# Patient Record
Sex: Male | Born: 1945 | Race: White | Hispanic: No | Smoking: Former smoker
Health system: Southern US, Community
[De-identification: ages and names within clinical notes are randomized; demographics above are authoritative.]

## PROBLEM LIST (undated history)

## (undated) DIAGNOSIS — I219 Acute myocardial infarction, unspecified: Secondary | ICD-10-CM

## (undated) DIAGNOSIS — T7840XA Allergy, unspecified, initial encounter: Secondary | ICD-10-CM

## (undated) DIAGNOSIS — I2510 Atherosclerotic heart disease of native coronary artery without angina pectoris: Secondary | ICD-10-CM

## (undated) DIAGNOSIS — E785 Hyperlipidemia, unspecified: Secondary | ICD-10-CM

## (undated) DIAGNOSIS — K219 Gastro-esophageal reflux disease without esophagitis: Secondary | ICD-10-CM

## (undated) DIAGNOSIS — M1990 Unspecified osteoarthritis, unspecified site: Secondary | ICD-10-CM

## (undated) DIAGNOSIS — C801 Malignant (primary) neoplasm, unspecified: Secondary | ICD-10-CM

## (undated) HISTORY — DX: Unspecified osteoarthritis, unspecified site: M19.90

## (undated) HISTORY — PX: HEMORRHOID SURGERY: SHX153

## (undated) HISTORY — DX: Malignant (primary) neoplasm, unspecified: C80.1

## (undated) HISTORY — DX: Acute myocardial infarction, unspecified: I21.9

## (undated) HISTORY — DX: Gastro-esophageal reflux disease without esophagitis: K21.9

## (undated) HISTORY — PX: ORIF SHOULDER FRACTURE: SHX5035

## (undated) HISTORY — PX: TONSILLECTOMY AND ADENOIDECTOMY: SUR1326

## (undated) HISTORY — PX: CORONARY ANGIOPLASTY WITH STENT PLACEMENT: SHX49

## (undated) HISTORY — DX: Allergy, unspecified, initial encounter: T78.40XA

---

## 1987-01-30 DIAGNOSIS — I219 Acute myocardial infarction, unspecified: Secondary | ICD-10-CM

## 1987-01-30 HISTORY — DX: Acute myocardial infarction, unspecified: I21.9

## 2004-01-30 HISTORY — PX: COLONOSCOPY: SHX174

## 2004-11-16 ENCOUNTER — Ambulatory Visit: Payer: Self-pay | Admitting: Gastroenterology

## 2004-12-06 ENCOUNTER — Ambulatory Visit: Payer: Self-pay | Admitting: Gastroenterology

## 2005-02-13 ENCOUNTER — Ambulatory Visit (HOSPITAL_COMMUNITY): Admission: RE | Admit: 2005-02-13 | Discharge: 2005-02-13 | Payer: Self-pay | Admitting: Surgery

## 2005-03-22 ENCOUNTER — Ambulatory Visit (HOSPITAL_BASED_OUTPATIENT_CLINIC_OR_DEPARTMENT_OTHER): Admission: RE | Admit: 2005-03-22 | Discharge: 2005-03-22 | Payer: Self-pay | Admitting: Surgery

## 2009-11-15 ENCOUNTER — Encounter (INDEPENDENT_AMBULATORY_CARE_PROVIDER_SITE_OTHER): Payer: Self-pay | Admitting: *Deleted

## 2010-02-28 NOTE — Letter (Signed)
Summary: Colonoscopy Letter  [facility]  [address] [place], [place] [address]   Phone: [phone]  Fax: [phone]      November 15, 2009 MRN: [id number]   Jeff Decker POB [address], [place]  [address]   Dear [name],   According to your medical record, it is time for you to schedule a Colonoscopy. The American Cancer Society recommends this procedure as a method to detect early colon cancer. Patients with a family history of colon cancer, or a personal history of colon polyps or inflammatory bowel disease are at increased risk.  This letter has been generated based on the recommendations made at the time of your procedure. If you feel that in your particular situation this may no longer apply, please contact our office.  Please call our office at [phone] to schedule this appointment or to update your records at your earliest convenience.  Thank you for cooperating with [place] to provide you with the very best care possible.   Sincerely,   Vania Rea. Jarold Motto, M.D.  [facility] [phone]

## 2011-10-23 ENCOUNTER — Encounter: Payer: Self-pay | Admitting: Gastroenterology

## 2015-02-26 DIAGNOSIS — M1990 Unspecified osteoarthritis, unspecified site: Secondary | ICD-10-CM | POA: Insufficient documentation

## 2015-02-26 DIAGNOSIS — N529 Male erectile dysfunction, unspecified: Secondary | ICD-10-CM | POA: Insufficient documentation

## 2016-02-09 DIAGNOSIS — K219 Gastro-esophageal reflux disease without esophagitis: Secondary | ICD-10-CM | POA: Insufficient documentation

## 2016-03-26 DIAGNOSIS — E782 Mixed hyperlipidemia: Secondary | ICD-10-CM | POA: Insufficient documentation

## 2016-03-29 DIAGNOSIS — C801 Malignant (primary) neoplasm, unspecified: Secondary | ICD-10-CM

## 2016-03-29 HISTORY — DX: Malignant (primary) neoplasm, unspecified: C80.1

## 2016-03-29 HISTORY — PX: PROSTATE BIOPSY: SHX241

## 2016-05-15 ENCOUNTER — Other Ambulatory Visit: Payer: Self-pay | Admitting: Urology

## 2016-05-15 DIAGNOSIS — C61 Malignant neoplasm of prostate: Secondary | ICD-10-CM

## 2016-05-16 DIAGNOSIS — C61 Malignant neoplasm of prostate: Secondary | ICD-10-CM | POA: Insufficient documentation

## 2016-05-18 ENCOUNTER — Encounter: Payer: Self-pay | Admitting: Oncology

## 2016-05-18 ENCOUNTER — Telehealth: Payer: Self-pay | Admitting: Oncology

## 2016-05-18 NOTE — Telephone Encounter (Signed)
Tc to the pt to schedule an appt for him to see Dr. Alen Blew. Appt scheduled on 5/4 at 2pm. Demographics verified. Pt agreed to the appt date and time. Letter mailed to the pt.

## 2016-05-23 ENCOUNTER — Encounter (HOSPITAL_COMMUNITY)
Admission: RE | Admit: 2016-05-23 | Discharge: 2016-05-23 | Disposition: A | Discharge status: Home / Self Care | Payer: Medicare Other | Source: Ambulatory Visit | Attending: Urology | Admitting: Urology

## 2016-05-23 ENCOUNTER — Ambulatory Visit (HOSPITAL_COMMUNITY)
Admission: RE | Admit: 2016-05-23 | Discharge: 2016-05-23 | Disposition: A | Discharge status: Home / Self Care | Payer: Medicare Other | Source: Ambulatory Visit | Attending: Urology | Admitting: Urology

## 2016-05-23 DIAGNOSIS — C61 Malignant neoplasm of prostate: Secondary | ICD-10-CM | POA: Insufficient documentation

## 2016-05-23 MED ORDER — TECHNETIUM TC 99M MEDRONATE IV KIT
25.0000 | PACK | Freq: Once | INTRAVENOUS | Status: AC | PRN
  Administered 2016-05-23: 18.2 via INTRAVENOUS

## 2016-06-01 ENCOUNTER — Telehealth: Payer: Self-pay | Admitting: Oncology

## 2016-06-01 ENCOUNTER — Encounter: Payer: Self-pay | Admitting: Medical Oncology

## 2016-06-01 ENCOUNTER — Ambulatory Visit (HOSPITAL_BASED_OUTPATIENT_CLINIC_OR_DEPARTMENT_OTHER): Payer: Medicare Other | Admitting: Oncology

## 2016-06-01 VITALS — BP 120/74 | HR 69 | Temp 98.6°F | Resp 20 | Wt 176.2 lb

## 2016-06-01 DIAGNOSIS — C61 Malignant neoplasm of prostate: Secondary | ICD-10-CM

## 2016-06-01 DIAGNOSIS — E291 Testicular hypofunction: Secondary | ICD-10-CM | POA: Diagnosis not present

## 2016-06-01 DIAGNOSIS — C7951 Secondary malignant neoplasm of bone: Secondary | ICD-10-CM | POA: Diagnosis not present

## 2016-06-01 DIAGNOSIS — Z7189 Other specified counseling: Secondary | ICD-10-CM

## 2016-06-01 NOTE — Progress Notes (Signed)
Introduced myself to Jeff Decker and his friend Lattie Haw as the nurse navigator and my role. He saw Dr. Alen Blew today and just is in disbelief of his prostate cancer. He states that his father had prostate cancer but he just recently learned this. He also states that his mother had breast cancer. We discussed he may want to meet with genetics dues to mother, father and a brother with cancer. Mr. Fortner has 2 sons and a daughter. He is going to consider his options of chemotherapy or taking oral chemo. I gave him my business card and asked him to call with any questions or concerns.

## 2016-06-01 NOTE — Telephone Encounter (Signed)
No LOS at time of scheduling/ check out

## 2016-06-01 NOTE — Progress Notes (Signed)
Reason for Referral: Prostate cancer.   HPI: Jeff Decker is a 71 year old gentleman without any significant comorbid conditions referred to me for evaluation of prostate cancer. He has history of coronary artery disease status post MI in 1998 but have no issues since that time. On a routine physical examination he was found to have a PSA of 262 without any symptoms. He was evaluated by Dr. Pilar Jarvis at [facility] urology and a prostate biopsy obtained on 04/30/2016 showed a Gleason score 4+5 = 9 in one core and multiple cores of Gleason 8. His staging workup including a bone scan which showed metastatic disease in multiple areas including in the right pelvis, L1, ribs among others. CT scan of the abdomen and pelvis showed pelvic adenopathy including external iliac, and fell renal and periaortic lymph nodes. He was started on Firmagon I received the first injection in April 2018.  Despite these findings, he is completely asymptomatic. He denied any back pain, shoulder pain or hip pain. He denied any frequency or hematuria. He denies any hesitancy or incontinence. He remains in excellent health and shape and remains very active. He exercise regularly and continues to attend to her activities of daily living. His father was diagnosed with prostate cancer in his 13s but no other family history.  He does not report any headaches, blurry vision, syncope or seizures. He does not report any fevers, chills or sweats. He does not report any cough, wheezing or hemoptysis. He does not report any nausea, vomiting or abdominal pain. He does not report any frequency, urgency or hesitancy. He does not report any skeletal complaints of arthralgias or myalgias. Remaining review of system is unremarkable.  Past medical history significant for coronary disease, controlled surgery and status post tonsillectomy.     Current Outpatient Prescriptions:  .  aspirin EC 81 MG tablet, Take 81 mg by mouth daily., Disp: , Rfl:  .   Calcium Carb-Cholecalciferol (CALCIUM 600+D) 600-800 MG-UNIT TABS, Take by mouth., Disp: , Rfl:  .  glucosamine-chondroitin 500-400 MG tablet, Take 1 tablet by mouth 3 (three) times daily., Disp: , Rfl:  .  omeprazole (PRILOSEC) 20 MG capsule, Take 20 mg by mouth., Disp: , Rfl:  .  vardenafil (LEVITRA) 20 MG tablet, Take 1 po 30 minutes before planned activity, Disp: , Rfl:  .  DOCOSAHEXAENOIC ACID PO, Take 1 g by mouth., Disp: , Rfl:  .  Multiple Vitamin (MULTI-VITAMINS) TABS, Take by mouth., Disp: , Rfl: :  Not on File:  No family history on file.:  Social History   Social History  . Marital status: Widowed    Spouse name: N/A  . Number of children: N/A  . Years of education: N/A   Occupational History  . Not on file.   Social History Main Topics  . Smoking status: Not on file  . Smokeless tobacco: Not on file  . Alcohol use Not on file  . Drug use: Unknown  . Sexual activity: Not on file   Other Topics Concern  . Not on file   Social History Narrative  . No narrative on file  :  Pertinent items are noted in HPI.  Exam: Blood pressure 120/74, pulse 69, temperature 98.6 F (37 C), temperature source Oral, resp. rate 20, weight 176 lb 3.2 oz (79.9 kg), SpO2 98 %. ECOG 0. General appearance: alert and cooperative appeared without distress. Throat: No oral thrush or ulcers. Neck: no adenopathy Back: negative Resp: clear to auscultation bilaterally Chest wall: no tenderness Cardio:  regular rate and rhythm, S1, S2 normal, no murmur, click, rub or gallop GI: soft, non-tender; bowel sounds normal; no masses,  no organomegaly Pulses: 2+ and symmetric Skin: Skin color, texture, turgor normal. No rashes or lesions Lymph nodes: Cervical, supraclavicular, and axillary nodes normal.     Nm Bone Scan Whole Body  Result Date: 05/23/2016 CLINICAL DATA:  Recent diagnosis of prostate carcinoma EXAM: NUCLEAR MEDICINE WHOLE BODY BONE SCAN TECHNIQUE: Whole body anterior and  posterior images were obtained approximately 3 hours after intravenous injection of radiopharmaceutical. RADIOPHARMACEUTICALS:  18.2 mCi Technetium-82m MDP IV COMPARISON:  CT abdomen pelvis from today FINDINGS: There are multiple foci of increased activity, the majority of which are within bones of the pelvis, primarily the right acetabulum, the right sacrum, and the proximal femurs. In addition there is a focus of activity in L1 which corresponds to a sclerotic lesion by CT. There also more foci in several posterior ribs which are not included on the CT abdomen pelvis consistent with metastatic disease as well. IMPRESSION: Multiple foci of increased activity primarily in the right pelvis, L1, and several ribs consistent with bone metastases when correlated with today's CT of the abdomen and pelvis. Electronically Signed   By: Ivar Drape M.D.   On: 05/23/2016 15:54    Assessment and Plan:   71 year old gentleman with the following issues:  1. Metastatic prostate cancer diagnosed in April 2018. He presented with a PSA of 262 and a Gleason score 4+5 = 9 in one core with multiple cores involved Gleason score 4+4 = 8. Staging workup showed metastatic disease including lymphadenopathy as well as sclerotic bony lesions and multiple areas.  The natural course of advanced prostate cancer that is hormone sensitive was reviewed with the patient today. He has an incurable malignancy and no curative option exist at this time. However, multiple effective therapies exist to treat his disease and control his disease attentionally long-term. Androgen deprivation therapy is the standard of care at this time and should be effective in controlling his disease for a period of time. Eventually he will develop castration resistance and additional therapy might be required.  The rationale for using Zytiga or systemic chemotherapy in the hormone sensitive advanced prostate cancer was discussed today in detail. Risks and  benefits of both approaches were reviewed including the logistics. He appears to be more interested in systemic chemotherapy which I think is a better option for him. Complications associated with Taxotere chemotherapy were reviewed today in detail. These would include nausea, vomiting, myelosuppression, neuropathy, neutropenia, neutropenic sepsis among others. Alternatively Fabio Asa has less side effects but has to be taken continuously and associated with probably higher out of pocket cost.  He understands both therapies have extended overall survival compared to hormone therapy alone. He will let me know in the near future and we'll schedule his infusions accordingly once he decides.  2. IV access: Veins will be used for the time being.  3. Antiemetics: Prescription for Compazine will be available to the patient upon starting chemotherapy.  4. Androgen deprivation therapy: This will be required indefinitely.  5. Bone directed therapy: He will be a good candidate for Xgeva and will be discussed in the future.  6. Follow-up: Will be determined once he decides on the start of systemic therapy.

## 2016-06-04 ENCOUNTER — Telehealth: Payer: Self-pay | Admitting: Oncology

## 2016-06-04 ENCOUNTER — Telehealth: Payer: Self-pay | Admitting: *Deleted

## 2016-06-04 ENCOUNTER — Encounter: Payer: Self-pay | Admitting: *Deleted

## 2016-06-04 DIAGNOSIS — Z7189 Other specified counseling: Secondary | ICD-10-CM | POA: Insufficient documentation

## 2016-06-04 NOTE — Addendum Note (Signed)
Addended by: Wyatt Portela on: 06/04/2016 09:13 AM   Modules accepted: Orders

## 2016-06-04 NOTE — Progress Notes (Signed)
START ON PATHWAY REGIMEN - Prostate   Docetaxel 75 mg/m2:   A cycle is every 21 days:     Docetaxel   **Always confirm dose[facility] Bical[facility]:   A cycle is every 12 weeks:     Leuprolide acetate depot    Daily:     Bicalutamide   **Always confirm dose/schedule in your pharmacy ordering system**    Patient Characteristics: Adenocarcinoma, Metastatic, Hormone Naive, High Volume Disease* Current radiographic evidence of distant metastasis? Yes Histology: Adenocarcinoma AJCC T Category: cTX Gleason Primary: X AJCC N Category: NX Gleason Secondary: X AJCC M Category: M1c Gleason Score: 9 AJCC 8 Stage Grouping: IVB PSA Values (ng/mL): >= 20 Would you be surprised if this patient died  in the next year? I would be surprised if this patient died in the next year  Intent of Therapy: Non-Curative / Palliative Intent, Discussed with Patient

## 2016-06-04 NOTE — Telephone Encounter (Signed)
sw pt to confirm chemo class this week and 5/16 chemo start.

## 2016-06-04 NOTE — Telephone Encounter (Signed)
Patient calling to say he has decided to take chemotherapy ASAP. Note to dr Alen Blew. Schedulers will be calling to set up chemo teaching class and chemo appts.

## 2016-06-05 ENCOUNTER — Encounter: Payer: Self-pay | Admitting: *Deleted

## 2016-06-05 ENCOUNTER — Telehealth: Payer: Self-pay | Admitting: Oncology

## 2016-06-05 NOTE — Telephone Encounter (Signed)
Changed time of 5/30 lab/fu perm 5/8 schedule message. Spoke with patient he is aware.

## 2016-06-07 ENCOUNTER — Other Ambulatory Visit: Payer: Medicare Other

## 2016-06-07 ENCOUNTER — Encounter: Payer: Self-pay | Admitting: *Deleted

## 2016-06-07 ENCOUNTER — Telehealth: Payer: Self-pay | Admitting: *Deleted

## 2016-06-07 ENCOUNTER — Other Ambulatory Visit: Payer: Self-pay | Admitting: *Deleted

## 2016-06-07 MED ORDER — PROCHLORPERAZINE MALEATE 10 MG PO TABS: 10.0000 mg | ORAL_TABLET | Freq: Four times a day (QID) | ORAL | 1 refills | Status: DC | PRN

## 2016-06-07 NOTE — Telephone Encounter (Signed)
Patient left voice mail. I called back and got VM. Left message to cal me

## 2016-06-11 ENCOUNTER — Encounter: Payer: Self-pay | Admitting: Pharmacist

## 2016-06-11 ENCOUNTER — Telehealth: Payer: Self-pay | Admitting: *Deleted

## 2016-06-11 ENCOUNTER — Encounter: Payer: Self-pay | Admitting: *Deleted

## 2016-06-11 NOTE — Telephone Encounter (Signed)
Patient's height is 5'11"

## 2016-06-12 ENCOUNTER — Other Ambulatory Visit: Payer: Self-pay | Admitting: *Deleted

## 2016-06-12 DIAGNOSIS — C61 Malignant neoplasm of prostate: Secondary | ICD-10-CM

## 2016-06-13 ENCOUNTER — Ambulatory Visit (HOSPITAL_BASED_OUTPATIENT_CLINIC_OR_DEPARTMENT_OTHER): Payer: Medicare Other

## 2016-06-13 ENCOUNTER — Other Ambulatory Visit: Payer: Self-pay | Admitting: Oncology

## 2016-06-13 ENCOUNTER — Other Ambulatory Visit (HOSPITAL_BASED_OUTPATIENT_CLINIC_OR_DEPARTMENT_OTHER): Payer: Medicare Other

## 2016-06-13 VITALS — BP 137/69 | HR 55 | Temp 97.9°F | Resp 16

## 2016-06-13 DIAGNOSIS — C61 Malignant neoplasm of prostate: Secondary | ICD-10-CM

## 2016-06-13 DIAGNOSIS — Z5111 Encounter for antineoplastic chemotherapy: Secondary | ICD-10-CM

## 2016-06-13 LAB — COMPREHENSIVE METABOLIC PANEL
ALT: 55 U/L (ref 0–55)
AST: 38 U/L — ABNORMAL HIGH (ref 5–34)
Albumin: 3.9 g/dL (ref 3.5–5.0)
Alkaline Phosphatase: 101 U/L (ref 40–150)
Anion Gap: 10 mEq/L (ref 3–11)
BUN: 19.1 mg/dL (ref 7.0–26.0)
CO2: 23 mEq/L (ref 22–29)
Calcium: 9.4 mg/dL (ref 8.4–10.4)
Chloride: 107 mEq/L (ref 98–109)
Creatinine: 0.8 mg/dL (ref 0.7–1.3)
EGFR: 88 mL/min/{1.73_m2} — ABNORMAL LOW (ref >90)
Glucose: 115 mg/dl (ref 70–140)
Potassium: 4.3 mEq/L (ref 3.5–5.1)
Sodium: 140 mEq/L (ref 136–145)
Total Bilirubin: 0.46 mg/dL (ref 0.20–1.20)
Total Protein: 7.3 g/dL (ref 6.4–8.3)

## 2016-06-13 LAB — CBC WITH DIFFERENTIAL/PLATELET
BASO%: 0.6 % (ref 0.0–2.0)
Basophils Absolute: 0 10*3/uL (ref 0.0–0.1)
EOS%: 2.8 % (ref 0.0–7.0)
Eosinophils Absolute: 0.2 10*3/uL (ref 0.0–0.5)
HCT: 38.7 % (ref 38.4–49.9)
HGB: 13.3 g/dL (ref 13.0–17.1)
LYMPH%: 31.4 % (ref 14.0–49.0)
MCH: 29.4 pg (ref 27.2–33.4)
MCHC: 34.5 g/dL (ref 32.0–36.0)
MCV: 85.2 fL (ref 79.3–98.0)
MONO#: 0.7 10*3/uL (ref 0.1–0.9)
MONO%: 10.5 % (ref 0.0–14.0)
NEUT#: 3.7 10*3/uL (ref 1.5–6.5)
NEUT%: 54.7 % (ref 39.0–75.0)
Platelets: 245 10*3/uL (ref 140–400)
RBC: 4.54 10*6/uL (ref 4.20–5.82)
RDW: 13.8 % (ref 11.0–14.6)
WBC: 6.8 10*3/uL (ref 4.0–10.3)
lymph#: 2.1 10*3/uL (ref 0.9–3.3)

## 2016-06-13 MED ORDER — DEXAMETHASONE SODIUM PHOSPHATE 10 MG/ML IJ SOLN
INTRAMUSCULAR | Status: AC
  Filled 2016-06-13: qty 1

## 2016-06-13 MED ORDER — DEXAMETHASONE SODIUM PHOSPHATE 10 MG/ML IJ SOLN
10.0000 mg | Freq: Once | INTRAMUSCULAR | Status: AC
  Administered 2016-06-13: 10 mg via INTRAVENOUS

## 2016-06-13 MED ORDER — DOCETAXEL CHEMO INJECTION 160 MG/16ML
75.0000 mg/m2 | Freq: Once | INTRAVENOUS | Status: AC
  Administered 2016-06-13: 150 mg via INTRAVENOUS
  Filled 2016-06-13: qty 15

## 2016-06-13 MED ORDER — SODIUM CHLORIDE 0.9 % IV SOLN
Freq: Once | INTRAVENOUS | Status: AC
  Administered 2016-06-13: 09:00:00 via INTRAVENOUS

## 2016-06-13 NOTE — Patient Instructions (Signed)
[facility] Discharge Instructions for Patients Receiving Chemotherapy  Today you received the following chemotherapy agents:  Taxotere.  To help prevent nausea and vomiting after your treatment, we encourage you to take your nausea medication as directed.   If you develop nausea and vomiting that is not controlled by your nausea medication, call the clinic.   BELOW ARE SYMPTOMS THAT SHOULD BE REPORTED IMMEDIATELY:  *FEVER GREATER THAN 100.5 F  *CHILLS WITH OR WITHOUT FEVER  NAUSEA AND VOMITING THAT IS NOT CONTROLLED WITH YOUR NAUSEA MEDICATION  *UNUSUAL SHORTNESS OF BREATH  *UNUSUAL BRUISING OR BLEEDING  TENDERNESS IN MOUTH AND THROAT WITH OR WITHOUT PRESENCE OF ULCERS  *URINARY PROBLEMS  *BOWEL PROBLEMS  UNUSUAL RASH Items with * indicate a potential emergency and should be followed up as soon as possible.  Feel free to call the clinic you have any questions or concerns. The clinic phone number is [phone].  Please show the CHEMO ALERT CARD at check-in to the Emergency Department and triage nurse.  Docetaxel injection What is this medicine? DOCETAXEL (doe se TAX el) is a chemotherapy drug. It targets fast dividing cells, like cancer cells, and causes these cells to die. This medicine is used to treat many types of cancers like breast cancer, certain stomach cancers, head and neck cancer, lung cancer, and prostate cancer. This medicine may be used for other purposes; ask your health care provider or pharmacist if you have questions. COMMON BRAND NAME(S): Docefrez, Taxotere What should I tell my health care provider before I take this medicine? They need to know if you have any of these conditions: -infection (especially a virus infection such as chickenpox, cold sores, or herpes) -liver disease -low blood counts, like low white cell, platelet, or red cell counts -an unusual or allergic reaction to docetaxel, polysorbate 80, other chemotherapy agents,  other medicines, foods, dyes, or preservatives -pregnant or trying to get pregnant -breast-feeding How should I use this medicine? This drug is given as an infusion into a vein. It is administered in a hospital or clinic by a specially trained health care professional. Talk to your pediatrician regarding the use of this medicine in children. Special care may be needed. Overdosage: If you think you have taken too much of this medicine contact a poison control center or emergency room at once. NOTE: This medicine is only for you. Do not share this medicine with others. What if I miss a dose? It is important not to miss your dose. Call your doctor or health care professional if you are unable to keep an appointment. What may interact with this medicine? -cyclosporine -erythromycin -ketoconazole -medicines to increase blood counts like filgrastim, pegfilgrastim, sargramostim -vaccines Talk to your doctor or health care professional before taking any of these medicines: -acetaminophen -aspirin -ibuprofen -ketoprofen -naproxen This list may not describe all possible interactions. Give your health care provider a list of all the medicines, herbs, non-prescription drugs, or dietary supplements you use. Also tell them if you smoke, drink alcohol, or use illegal drugs. Some items may interact with your medicine. What should I watch for while using this medicine? Your condition will be monitored carefully while you are receiving this medicine. You will need important blood work done while you are taking this medicine. This drug may make you feel generally unwell. This is not uncommon, as chemotherapy can affect healthy cells as well as cancer cells. Report any side effects. Continue your course of treatment even though you feel ill unless   your doctor tells you to stop. In some cases, you may be given additional medicines to help with side effects. Follow all directions for their use. Call your doctor  or health care professional for advice if you get a fever, chills or sore throat, or other symptoms of a cold or flu. Do not treat yourself. This drug decreases your body's ability to fight infections. Try to avoid being around people who are sick. This medicine may increase your risk to bruise or bleed. Call your doctor or health care professional if you notice any unusual bleeding. This medicine may contain alcohol in the product. You may get drowsy or dizzy. Do not drive, use machinery, or do anything that needs mental alertness until you know how this medicine affects you. Do not stand or sit up quickly, especially if you are an older patient. This reduces the risk of dizzy or fainting spells. Avoid alcoholic drinks. Do not become pregnant while taking this medicine. Women should inform their doctor if they wish to become pregnant or think they might be pregnant. There is a potential for serious side effects to an unborn child. Talk to your health care professional or pharmacist for more information. Do not breast-feed an infant while taking this medicine. What side effects may I notice from receiving this medicine? Side effects that you should report to your doctor or health care professional as soon as possible: -allergic reactions like skin rash, itching or hives, swelling of the face, lips, or tongue -low blood counts - This drug may decrease the number of white blood cells, red blood cells and platelets. You may be at increased risk for infections and bleeding. -signs of infection - fever or chills, cough, sore throat, pain or difficulty passing urine -signs of decreased platelets or bleeding - bruising, pinpoint red spots on the skin, black, tarry stools, nosebleeds -signs of decreased red blood cells - unusually weak or tired, fainting spells, lightheadedness -breathing problems -fast or irregular heartbeat -low blood pressure -mouth sores -nausea and vomiting -pain, swelling, redness or  irritation at the injection site -pain, tingling, numbness in the hands or feet -swelling of the ankle, feet, hands -weight gain Side effects that usually do not require medical attention (report to your doctor or health care professional if they continue or are bothersome): -bone pain -complete hair loss including hair on your head, underarms, pubic hair, eyebrows, and eyelashes -diarrhea -excessive tearing -changes in the color of fingernails -loosening of the fingernails -nausea -muscle pain -red flush to skin -sweating -weak or tired This list may not describe all possible side effects. Call your doctor for medical advice about side effects. You may report side effects to FDA at 1-800-FDA-1088. Where should I keep my medicine? This drug is given in a hospital or clinic and will not be stored at home. NOTE: This sheet is a summary. It may not cover all possible information. If you have questions about this medicine, talk to your doctor, pharmacist, or health care provider.  2018 Elsevier/Gold Standard (2015-02-17 12:32:56)    

## 2016-06-14 ENCOUNTER — Ambulatory Visit (HOSPITAL_BASED_OUTPATIENT_CLINIC_OR_DEPARTMENT_OTHER): Payer: Medicare Other

## 2016-06-14 VITALS — BP 128/66 | HR 63 | Temp 96.9°F | Resp 18

## 2016-06-14 DIAGNOSIS — C61 Malignant neoplasm of prostate: Secondary | ICD-10-CM

## 2016-06-14 DIAGNOSIS — Z5189 Encounter for other specified aftercare: Secondary | ICD-10-CM | POA: Diagnosis not present

## 2016-06-14 MED ORDER — PEGFILGRASTIM INJECTION 6 MG/0.6ML ~~LOC~~
6.0000 mg | PREFILLED_SYRINGE | Freq: Once | SUBCUTANEOUS | Status: AC
  Administered 2016-06-14: 6 mg via SUBCUTANEOUS
  Filled 2016-06-14: qty 0.6

## 2016-06-14 NOTE — Patient Instructions (Signed)
Pegfilgrastim injection What is this medicine? PEGFILGRASTIM (PEG fil gra stim) is a long-acting granulocyte colony-stimulating factor that stimulates the growth of neutrophils, a type of white blood cell important in the body's fight against infection. It is used to reduce the incidence of fever and infection in patients with certain types of cancer who are receiving chemotherapy that affects the bone marrow, and to increase survival after being exposed to high doses of radiation. This medicine may be used for other purposes; ask your health care provider or pharmacist if you have questions. COMMON BRAND NAME(S): Neulasta What should I tell my health care provider before I take this medicine? They need to know if you have any of these conditions: -kidney disease -latex allergy -ongoing radiation therapy -sickle cell disease -skin reactions to acrylic adhesives (On-Body Injector only) -an unusual or allergic reaction to pegfilgrastim, filgrastim, other medicines, foods, dyes, or preservatives -pregnant or trying to get pregnant -breast-feeding How should I use this medicine? This medicine is for injection under the skin. If you get this medicine at home, you will be taught how to prepare and give the pre-filled syringe or how to use the On-body Injector. Refer to the patient Instructions for Use for detailed instructions. Use exactly as directed. Tell your healthcare provider immediately if you suspect that the On-body Injector may not have performed as intended or if you suspect the use of the On-body Injector resulted in a missed or partial dose. It is important that you put your used needles and syringes in a special sharps container. Do not put them in a trash can. If you do not have a sharps container, call your pharmacist or healthcare provider to get one. Talk to your pediatrician regarding the use of this medicine in children. While this drug may be prescribed for selected conditions,  precautions do apply. Overdosage: If you think you have taken too much of this medicine contact a poison control center or emergency room at once. NOTE: This medicine is only for you. Do not share this medicine with others. What if I miss a dose? It is important not to miss your dose. Call your doctor or health care professional if you miss your dose. If you miss a dose due to an On-body Injector failure or leakage, a new dose should be administered as soon as possible using a single prefilled syringe for manual use. What may interact with this medicine? Interactions have not been studied. Give your health care provider a list of all the medicines, herbs, non-prescription drugs, or dietary supplements you use. Also tell them if you smoke, drink alcohol, or use illegal drugs. Some items may interact with your medicine. This list may not describe all possible interactions. Give your health care provider a list of all the medicines, herbs, non-prescription drugs, or dietary supplements you use. Also tell them if you smoke, drink alcohol, or use illegal drugs. Some items may interact with your medicine. What should I watch for while using this medicine? You may need blood work done while you are taking this medicine. If you are going to need a MRI, CT scan, or other procedure, tell your doctor that you are using this medicine (On-Body Injector only). What side effects may I notice from receiving this medicine? Side effects that you should report to your doctor or health care professional as soon as possible: -allergic reactions like skin rash, itching or hives, swelling of the face, lips, or tongue -dizziness -fever -pain, redness, or irritation at site   where injected -pinpoint red spots on the skin -red or dark-brown urine -shortness of breath or breathing problems -stomach or side pain, or pain at the shoulder -swelling -tiredness -trouble passing urine or change in the amount of urine Side  effects that usually do not require medical attention (report to your doctor or health care professional if they continue or are bothersome): -bone pain -muscle pain This list may not describe all possible side effects. Call your doctor for medical advice about side effects. You may report side effects to FDA at 1-800-FDA-1088. Where should I keep my medicine? Keep out of the reach of children. Store pre-filled syringes in a refrigerator between 2 and 8 degrees C (36 and 46 degrees F). Do not freeze. Keep in carton to protect from light. Throw away this medicine if it is left out of the refrigerator for more than 48 hours. Throw away any unused medicine after the expiration date. NOTE: This sheet is a summary. It may not cover all possible information. If you have questions about this medicine, talk to your doctor, pharmacist, or health care provider.  2018 Elsevier/Gold Standard (2016-01-12 12:58:03)  

## 2016-06-18 ENCOUNTER — Telehealth: Payer: Self-pay | Admitting: *Deleted

## 2016-06-18 ENCOUNTER — Telehealth: Payer: Self-pay

## 2016-06-18 NOTE — Telephone Encounter (Signed)
-----   Message from Flo Shanks, RN sent at 06/13/2016 12:12 PM EDT ----- Regarding: Dr. Alen Blew First time Taxotere First time Taxotere with no problems.

## 2016-06-18 NOTE — Telephone Encounter (Signed)
Spoke with patient. States he is doing well after his first chemotherapy treatment. Eating and drinking well. Denies n/v or diarrhea. Knows to call for any problems

## 2016-06-18 NOTE — Telephone Encounter (Signed)
Pt has hx of low back pain that comes and goes. He was asking if it is OK to use ibuprofen. Gave him permission to use minimally. Explained docetaxel and neulasta can create aches. Suggested warm pack. Instructed to call if ibuprofen does not help.

## 2016-06-19 ENCOUNTER — Encounter: Payer: Self-pay | Admitting: *Deleted

## 2016-06-27 ENCOUNTER — Other Ambulatory Visit (HOSPITAL_BASED_OUTPATIENT_CLINIC_OR_DEPARTMENT_OTHER): Payer: Medicare Other

## 2016-06-27 ENCOUNTER — Ambulatory Visit (HOSPITAL_BASED_OUTPATIENT_CLINIC_OR_DEPARTMENT_OTHER): Payer: Medicare Other | Admitting: Oncology

## 2016-06-27 ENCOUNTER — Telehealth: Payer: Self-pay | Admitting: Oncology

## 2016-06-27 VITALS — BP 119/60 | HR 65 | Temp 98.0°F | Resp 20 | Ht 71.0 in | Wt 178.3 lb

## 2016-06-27 DIAGNOSIS — C61 Malignant neoplasm of prostate: Secondary | ICD-10-CM

## 2016-06-27 DIAGNOSIS — C7951 Secondary malignant neoplasm of bone: Secondary | ICD-10-CM | POA: Diagnosis not present

## 2016-06-27 LAB — CBC WITH DIFFERENTIAL/PLATELET
BASO%: 1 % (ref 0.0–2.0)
Basophils Absolute: 0.1 10*3/uL (ref 0.0–0.1)
EOS%: 0.1 % (ref 0.0–7.0)
Eosinophils Absolute: 0 10*3/uL (ref 0.0–0.5)
HCT: 37 % — ABNORMAL LOW (ref 38.4–49.9)
HGB: 12.5 g/dL — ABNORMAL LOW (ref 13.0–17.1)
LYMPH%: 21.5 % (ref 14.0–49.0)
MCH: 29 pg (ref 27.2–33.4)
MCHC: 33.9 g/dL (ref 32.0–36.0)
MCV: 85.5 fL (ref 79.3–98.0)
MONO#: 0.7 10*3/uL (ref 0.1–0.9)
MONO%: 7.1 % (ref 0.0–14.0)
NEUT#: 6.9 10*3/uL — ABNORMAL HIGH (ref 1.5–6.5)
NEUT%: 70.3 % (ref 39.0–75.0)
Platelets: 219 10*3/uL (ref 140–400)
RBC: 4.33 10*6/uL (ref 4.20–5.82)
RDW: 13.9 % (ref 11.0–14.6)
WBC: 9.8 10*3/uL (ref 4.0–10.3)
lymph#: 2.1 10*3/uL (ref 0.9–3.3)

## 2016-06-27 LAB — COMPREHENSIVE METABOLIC PANEL
ALT: 39 U/L (ref 0–55)
AST: 24 U/L (ref 5–34)
Albumin: 3.8 g/dL (ref 3.5–5.0)
Alkaline Phosphatase: 94 U/L (ref 40–150)
Anion Gap: 7 mEq/L (ref 3–11)
BUN: 17.2 mg/dL (ref 7.0–26.0)
CO2: 26 mEq/L (ref 22–29)
Calcium: 9 mg/dL (ref 8.4–10.4)
Chloride: 107 mEq/L (ref 98–109)
Creatinine: 0.8 mg/dL (ref 0.7–1.3)
EGFR: 88 mL/min/{1.73_m2} — ABNORMAL LOW (ref >90)
Glucose: 96 mg/dl (ref 70–140)
Potassium: 4 mEq/L (ref 3.5–5.1)
Sodium: 140 mEq/L (ref 136–145)
Total Bilirubin: 0.3 mg/dL (ref 0.20–1.20)
Total Protein: 7.1 g/dL (ref 6.4–8.3)

## 2016-06-27 NOTE — Progress Notes (Signed)
Hematology and Oncology Follow Up Visit  Jeff Decker [id number] 07/21/1945 71 y.o. 06/27/2016 12:13 PM [facility] [place]   Principle Diagnosis: 71 year old gentleman with prostate cancer diagnosed in April 2018. He presented with a PSA of 262 and a Gleason score 4+5 = 9. He has metastatic disease with lymphadenopathy and bone disease.   Prior Therapy:  He is status post prostate biopsy done on April 2018 showed a Gleason score 4+5 = 9.  Current therapy:  Androgen deprivation therapy in the form of Firmagon started in April 2018.  Taxotere chemotherapy at 75 mg/m cycle one given on 06/13/2016.  Interim History: [name] presents today for a follow-up visit. Since the last visit, he received the first cycle of chemotherapy without complications. He denied any nausea, vomiting, neuropathy or any infusion related complications. He denied any edema or excessive fatigue. He remains active and continues to attend to her activities of daily living. He denies any back pain, shoulder pain or any pathological fractures.  He does not report any headaches, blurry vision, syncope or seizures. He does not report any fevers, chills or sweats. He does not report any cough, wheezing or hemoptysis. He does not report any nausea, vomiting or abdominal pain. He does not report any frequency, urgency or hesitancy. He does not report any skeletal complaints of arthralgias or myalgias. Remaining review of system is unremarkable  Medications: I have reviewed the patient's current medications.  Current Outpatient Prescriptions  Medication Sig Dispense Refill  . aspirin EC 81 MG tablet Take 81 mg by mouth daily.    . Calcium Carb-Cholecalciferol (CALCIUM 600+D) 600-800 MG-UNIT TABS Take by mouth.    Marland Kitchen glucosamine-chondroitin 500-400 MG tablet Take 1 tablet by mouth 3 (three) times daily.    . Multiple Vitamin (MULTI-VITAMINS) TABS Take by mouth.    Marland Kitchen  omeprazole (PRILOSEC) 20 MG capsule Take 20 mg by mouth.    . prochlorperazine (COMPAZINE) 10 MG tablet Take 1 tablet (10 mg total) by mouth every 6 (six) hours as needed for nausea or vomiting. 30 tablet 1  . vardenafil (LEVITRA) 20 MG tablet Take 1 po 30 minutes before planned activity     No current facility-administered medications for this visit.      Allergies: No Known Allergies  Past Medical History, Surgical history, Social history, and Family History were reviewed and updated.   Physical Exam: Blood pressure 119/60, pulse 65, temperature 98 F (36.7 C), temperature source Oral, resp. rate 20, height 5\' 11"  (1.803 m), weight 178 lb 4.8 oz (80.9 kg), SpO2 99 %. ECOG: 0 General appearance: alert and cooperative Head: Normocephalic, without obvious abnormality Neck: no adenopathy Lymph nodes: Cervical, supraclavicular, and axillary nodes normal. Heart:regular rate and rhythm, S1, S2 normal, no murmur, click, rub or gallop Lung:chest clear, no wheezing, rales, normal symmetric air entry. Abdomin: soft, non-tender, without masses or organomegaly EXT:no erythema, induration, or nodules   Lab Results: Lab Results  Component Value Date   WBC 9.8 06/27/2016   HGB 12.5 (L) 06/27/2016   HCT 37.0 (L) 06/27/2016   MCV 85.5 06/27/2016   PLT 219 06/27/2016     Chemistry      Component Value Date/Time   NA 140 06/13/2016 0816   K 4.3 06/13/2016 0816   CO2 23 06/13/2016 0816   BUN 19.1 06/13/2016 0816   CREATININE 0.8 06/13/2016 0816      Component Value Date/Time   CALCIUM 9.4 06/13/2016 0816   ALKPHOS 101 06/13/2016 0816  AST 38 (H) 06/13/2016 0816   ALT 55 06/13/2016 0816   BILITOT 0.46 06/13/2016 0816      Impression and Plan:  71 year old gentleman with the following issues:  1. Metastatic prostate cancer diagnosed in April 2018. He presented with a PSA of 262 and a Gleason score 4+5 = 9 in one core with multiple cores involved Gleason score 4+4 = 8. Staging  workup showed metastatic disease including lymphadenopathy as well as sclerotic bony lesions and multiple areas.  He is currently receiving Taxotere chemotherapy at 75 mg/m and tolerated it very well.  The plan is to proceed with cycle 2 of chemotherapy without any dose reduction or delay. He is scheduled to receive that infusion on 07/04/2016. The plan is to complete total of 6 cycles.  2. IV access: His peripheral veins will be used for the time being. We will discuss the utility of Port-A-Cath of needed to in the future.  3. Antiemetics: Prescription for Compazine will be available to the patient upon starting chemotherapy.  4. Androgen deprivation therapy: This will be required indefinitely. He is currently receiving Mills Koller on a monthly basis at [facility] urology.  5. Bone directed therapy: He will be a good candidate for Xgeva and will be discussed in the future.  6. Follow-up: Next week to receive cycle 2 of chemotherapy and in 3 weeks after that for the next cycle of chemotherapy.   Zola Button, MD 5/30/201812:13 PM

## 2016-06-27 NOTE — Telephone Encounter (Signed)
Gave patient AVS and calender per 5/30 LOS -  

## 2016-06-28 ENCOUNTER — Telehealth: Payer: Self-pay | Admitting: *Deleted

## 2016-06-28 LAB — PSA: Prostate Specific Ag, Serum: 1.1 ng/mL (ref 0.0–4.0)

## 2016-06-28 NOTE — Telephone Encounter (Signed)
As noted below by Dr. Alen Blew, I informed patient of his PSA level. Instructed patient to call [place] if he had any questions or concerns.

## 2016-06-28 NOTE — Telephone Encounter (Signed)
-----   Message from Wyatt Portela, MD sent at 06/28/2016  8:15 AM EDT ----- Please let him know his PSA is down.

## 2016-07-02 ENCOUNTER — Encounter: Payer: Self-pay | Admitting: Oncology

## 2016-07-02 NOTE — Progress Notes (Signed)
Called patient to introduce myself and to see if he is interested in applying for copay assistance through [employer] which funds just became available for his diagnosis. Left a voicemail with my contact name and number to return my call. Funds usually go quickly therefore trying to apply online if not, may complete tomorrow if funds are still available.

## 2016-07-03 ENCOUNTER — Other Ambulatory Visit (HOSPITAL_BASED_OUTPATIENT_CLINIC_OR_DEPARTMENT_OTHER): Payer: Medicare Other

## 2016-07-03 ENCOUNTER — Ambulatory Visit (HOSPITAL_BASED_OUTPATIENT_CLINIC_OR_DEPARTMENT_OTHER): Payer: Medicare Other

## 2016-07-03 ENCOUNTER — Encounter: Payer: Self-pay | Admitting: Oncology

## 2016-07-03 VITALS — BP 124/71 | HR 65 | Temp 98.1°F | Resp 18

## 2016-07-03 DIAGNOSIS — C61 Malignant neoplasm of prostate: Secondary | ICD-10-CM

## 2016-07-03 DIAGNOSIS — Z5111 Encounter for antineoplastic chemotherapy: Secondary | ICD-10-CM

## 2016-07-03 LAB — COMPREHENSIVE METABOLIC PANEL
ALT: 30 U/L (ref 0–55)
AST: 24 U/L (ref 5–34)
Albumin: 3.8 g/dL (ref 3.5–5.0)
Alkaline Phosphatase: 86 U/L (ref 40–150)
Anion Gap: 9 mEq/L (ref 3–11)
BUN: 15 mg/dL (ref 7.0–26.0)
CO2: 24 mEq/L (ref 22–29)
Calcium: 9.1 mg/dL (ref 8.4–10.4)
Chloride: 107 mEq/L (ref 98–109)
Creatinine: 0.9 mg/dL (ref 0.7–1.3)
EGFR: 85 mL/min/{1.73_m2} — ABNORMAL LOW (ref >90)
Glucose: 113 mg/dl (ref 70–140)
Potassium: 4 mEq/L (ref 3.5–5.1)
Sodium: 140 mEq/L (ref 136–145)
Total Bilirubin: 0.42 mg/dL (ref 0.20–1.20)
Total Protein: 6.9 g/dL (ref 6.4–8.3)

## 2016-07-03 LAB — CBC WITH DIFFERENTIAL/PLATELET
BASO%: 0.9 % (ref 0.0–2.0)
Basophils Absolute: 0.1 10*3/uL (ref 0.0–0.1)
EOS%: 0.7 % (ref 0.0–7.0)
Eosinophils Absolute: 0.1 10*3/uL (ref 0.0–0.5)
HCT: 36.9 % — ABNORMAL LOW (ref 38.4–49.9)
HGB: 12.6 g/dL — ABNORMAL LOW (ref 13.0–17.1)
LYMPH%: 26 % (ref 14.0–49.0)
MCH: 29.3 pg (ref 27.2–33.4)
MCHC: 34.3 g/dL (ref 32.0–36.0)
MCV: 85.5 fL (ref 79.3–98.0)
MONO#: 0.9 10*3/uL (ref 0.1–0.9)
MONO%: 10.2 % (ref 0.0–14.0)
NEUT#: 5.6 10*3/uL (ref 1.5–6.5)
NEUT%: 62.2 % (ref 39.0–75.0)
Platelets: 380 10*3/uL (ref 140–400)
RBC: 4.32 10*6/uL (ref 4.20–5.82)
RDW: 14.1 % (ref 11.0–14.6)
WBC: 8.9 10*3/uL (ref 4.0–10.3)
lymph#: 2.3 10*3/uL (ref 0.9–3.3)

## 2016-07-03 MED ORDER — DEXAMETHASONE SODIUM PHOSPHATE 10 MG/ML IJ SOLN
10.0000 mg | Freq: Once | INTRAMUSCULAR | Status: AC
  Administered 2016-07-03: 10 mg via INTRAVENOUS

## 2016-07-03 MED ORDER — DOCETAXEL CHEMO INJECTION 160 MG/16ML
75.0000 mg/m2 | Freq: Once | INTRAVENOUS | Status: AC
  Administered 2016-07-03: 150 mg via INTRAVENOUS
  Filled 2016-07-03: qty 15

## 2016-07-03 MED ORDER — SODIUM CHLORIDE 0.9 % IV SOLN
Freq: Once | INTRAVENOUS | Status: AC
  Administered 2016-07-03: 12:00:00 via INTRAVENOUS

## 2016-07-03 MED ORDER — DEXAMETHASONE SODIUM PHOSPHATE 10 MG/ML IJ SOLN
INTRAMUSCULAR | Status: AC
  Filled 2016-07-03: qty 1

## 2016-07-03 NOTE — Progress Notes (Signed)
Patient came in responding to note I placed in. Introduced myself to patient and advised there are funds available for his diagnosis. Asked patient if he wished to apply, he states yes. Also advised patient based on his verbal income, he qualifies for the two in-house grants [employer]($400) and [employer]state($200). Patient signed application and was given a copy as well as an expense sheet along with the outpatient pharmacy information.   Begin to enroll patient online through [employer] with the information he provided. Patient was paged to have labs drawn and will come back afterwards.

## 2016-07-03 NOTE — Patient Instructions (Addendum)
[facility] Discharge Instructions for Patients Receiving Chemotherapy  Today you received the following chemotherapy agents Docetaxel.  To help prevent nausea and vomiting after your treatment, we encourage you to take your nausea medication as directed.    If you develop nausea and vomiting that is not controlled by your nausea medication, call the clinic.   BELOW ARE SYMPTOMS THAT SHOULD BE REPORTED IMMEDIATELY:  *FEVER GREATER THAN 100.5 F  *CHILLS WITH OR WITHOUT FEVER  NAUSEA AND VOMITING THAT IS NOT CONTROLLED WITH YOUR NAUSEA MEDICATION  *UNUSUAL SHORTNESS OF BREATH  *UNUSUAL BRUISING OR BLEEDING  TENDERNESS IN MOUTH AND THROAT WITH OR WITHOUT PRESENCE OF ULCERS  *URINARY PROBLEMS  *BOWEL PROBLEMS  UNUSUAL RASH Items with * indicate a potential emergency and should be followed up as soon as possible.  Feel free to call the clinic you have any questions or concerns. The clinic phone number is [phone].  Please show the CHEMO ALERT CARD at check-in to the Emergency Department and triage nurse.   

## 2016-07-03 NOTE — Progress Notes (Signed)
Patient approved for the copay assistance grant through [employer].   Approval is for guaranteed amount of $3500 07/03/16-09/01/17 with a look-back period 01/05/16. Advised patient of the approval and that he would receive a copy of the award letter in the mail. Advised him there is nothing he needs to do on his end and that I would be sending the information to the billing department and having the physician to sign his portion and fax it back to [employer]. He verbalized understanding. Patient was very appreciative and has my name and number for any additional financial questions or concerns.

## 2016-07-04 ENCOUNTER — Encounter: Payer: Self-pay | Admitting: Oncology

## 2016-07-04 NOTE — Progress Notes (Signed)
Obtained physician signature for [employer] copay assistance. Faxed information to [employer]. Fax received ok per confirmation sheet.  Emailed award letter and POE to billing.

## 2016-07-05 ENCOUNTER — Ambulatory Visit (HOSPITAL_BASED_OUTPATIENT_CLINIC_OR_DEPARTMENT_OTHER): Payer: Medicare Other

## 2016-07-05 VITALS — BP 122/66 | HR 62 | Temp 97.1°F | Resp 20

## 2016-07-05 DIAGNOSIS — C61 Malignant neoplasm of prostate: Secondary | ICD-10-CM | POA: Diagnosis not present

## 2016-07-05 DIAGNOSIS — Z5189 Encounter for other specified aftercare: Secondary | ICD-10-CM | POA: Diagnosis not present

## 2016-07-05 MED ORDER — PEGFILGRASTIM INJECTION 6 MG/0.6ML ~~LOC~~
6.0000 mg | PREFILLED_SYRINGE | Freq: Once | SUBCUTANEOUS | Status: AC
  Administered 2016-07-05: 6 mg via SUBCUTANEOUS
  Filled 2016-07-05: qty 0.6

## 2016-07-05 NOTE — Patient Instructions (Signed)
Pegfilgrastim injection What is this medicine? PEGFILGRASTIM (PEG fil gra stim) is a long-acting granulocyte colony-stimulating factor that stimulates the growth of neutrophils, a type of white blood cell important in the body's fight against infection. It is used to reduce the incidence of fever and infection in patients with certain types of cancer who are receiving chemotherapy that affects the bone marrow, and to increase survival after being exposed to high doses of radiation. This medicine may be used for other purposes; ask your health care provider or pharmacist if you have questions. COMMON BRAND NAME(S): Neulasta What should I tell my health care provider before I take this medicine? They need to know if you have any of these conditions: -kidney disease -latex allergy -ongoing radiation therapy -sickle cell disease -skin reactions to acrylic adhesives (On-Body Injector only) -an unusual or allergic reaction to pegfilgrastim, filgrastim, other medicines, foods, dyes, or preservatives -pregnant or trying to get pregnant -breast-feeding How should I use this medicine? This medicine is for injection under the skin. If you get this medicine at home, you will be taught how to prepare and give the pre-filled syringe or how to use the On-body Injector. Refer to the patient Instructions for Use for detailed instructions. Use exactly as directed. Tell your healthcare provider immediately if you suspect that the On-body Injector may not have performed as intended or if you suspect the use of the On-body Injector resulted in a missed or partial dose. It is important that you put your used needles and syringes in a special sharps container. Do not put them in a trash can. If you do not have a sharps container, call your pharmacist or healthcare provider to get one. Talk to your pediatrician regarding the use of this medicine in children. While this drug may be prescribed for selected conditions,  precautions do apply. Overdosage: If you think you have taken too much of this medicine contact a poison control center or emergency room at once. NOTE: This medicine is only for you. Do not share this medicine with others. What if I miss a dose? It is important not to miss your dose. Call your doctor or health care professional if you miss your dose. If you miss a dose due to an On-body Injector failure or leakage, a new dose should be administered as soon as possible using a single prefilled syringe for manual use. What may interact with this medicine? Interactions have not been studied. Give your health care provider a list of all the medicines, herbs, non-prescription drugs, or dietary supplements you use. Also tell them if you smoke, drink alcohol, or use illegal drugs. Some items may interact with your medicine. This list may not describe all possible interactions. Give your health care provider a list of all the medicines, herbs, non-prescription drugs, or dietary supplements you use. Also tell them if you smoke, drink alcohol, or use illegal drugs. Some items may interact with your medicine. What should I watch for while using this medicine? You may need blood work done while you are taking this medicine. If you are going to need a MRI, CT scan, or other procedure, tell your doctor that you are using this medicine (On-Body Injector only). What side effects may I notice from receiving this medicine? Side effects that you should report to your doctor or health care professional as soon as possible: -allergic reactions like skin rash, itching or hives, swelling of the face, lips, or tongue -dizziness -fever -pain, redness, or irritation at site   where injected -pinpoint red spots on the skin -red or dark-brown urine -shortness of breath or breathing problems -stomach or side pain, or pain at the shoulder -swelling -tiredness -trouble passing urine or change in the amount of urine Side  effects that usually do not require medical attention (report to your doctor or health care professional if they continue or are bothersome): -bone pain -muscle pain This list may not describe all possible side effects. Call your doctor for medical advice about side effects. You may report side effects to FDA at 1-800-FDA-1088. Where should I keep my medicine? Keep out of the reach of children. Store pre-filled syringes in a refrigerator between 2 and 8 degrees C (36 and 46 degrees F). Do not freeze. Keep in carton to protect from light. Throw away this medicine if it is left out of the refrigerator for more than 48 hours. Throw away any unused medicine after the expiration date. NOTE: This sheet is a summary. It may not cover all possible information. If you have questions about this medicine, talk to your doctor, pharmacist, or health care provider.  2018 Elsevier/Gold Standard (2016-01-12 12:58:03)  

## 2016-07-24 ENCOUNTER — Ambulatory Visit (HOSPITAL_BASED_OUTPATIENT_CLINIC_OR_DEPARTMENT_OTHER): Payer: Medicare Other | Admitting: Oncology

## 2016-07-24 ENCOUNTER — Telehealth: Payer: Self-pay | Admitting: Oncology

## 2016-07-24 ENCOUNTER — Ambulatory Visit (HOSPITAL_BASED_OUTPATIENT_CLINIC_OR_DEPARTMENT_OTHER): Payer: Medicare Other

## 2016-07-24 ENCOUNTER — Encounter: Payer: Self-pay | Admitting: Oncology

## 2016-07-24 ENCOUNTER — Other Ambulatory Visit (HOSPITAL_BASED_OUTPATIENT_CLINIC_OR_DEPARTMENT_OTHER): Payer: Medicare Other

## 2016-07-24 VITALS — BP 110/63 | HR 61 | Temp 98.0°F | Resp 18 | Ht 71.0 in | Wt 180.8 lb

## 2016-07-24 DIAGNOSIS — C61 Malignant neoplasm of prostate: Secondary | ICD-10-CM

## 2016-07-24 DIAGNOSIS — C7951 Secondary malignant neoplasm of bone: Secondary | ICD-10-CM | POA: Diagnosis not present

## 2016-07-24 DIAGNOSIS — Z5111 Encounter for antineoplastic chemotherapy: Secondary | ICD-10-CM | POA: Diagnosis not present

## 2016-07-24 DIAGNOSIS — E291 Testicular hypofunction: Secondary | ICD-10-CM | POA: Diagnosis not present

## 2016-07-24 LAB — COMPREHENSIVE METABOLIC PANEL
ALT: 28 U/L (ref 0–55)
AST: 21 U/L (ref 5–34)
Albumin: 3.6 g/dL (ref 3.5–5.0)
Alkaline Phosphatase: 80 U/L (ref 40–150)
Anion Gap: 8 mEq/L (ref 3–11)
BUN: 14.5 mg/dL (ref 7.0–26.0)
CO2: 25 mEq/L (ref 22–29)
Calcium: 9.3 mg/dL (ref 8.4–10.4)
Chloride: 107 mEq/L (ref 98–109)
Creatinine: 0.8 mg/dL (ref 0.7–1.3)
EGFR: >90 mL/min/{1.73_m2} (ref >90)
Glucose: 104 mg/dl (ref 70–140)
Potassium: 4.2 mEq/L (ref 3.5–5.1)
Sodium: 141 mEq/L (ref 136–145)
Total Bilirubin: 0.45 mg/dL (ref 0.20–1.20)
Total Protein: 6.7 g/dL (ref 6.4–8.3)

## 2016-07-24 LAB — CBC WITH DIFFERENTIAL/PLATELET
BASO%: 1.3 % (ref 0.0–2.0)
Basophils Absolute: 0.1 10*3/uL (ref 0.0–0.1)
EOS%: 0.2 % (ref 0.0–7.0)
Eosinophils Absolute: 0 10*3/uL (ref 0.0–0.5)
HCT: 35.6 % — ABNORMAL LOW (ref 38.4–49.9)
HGB: 12.1 g/dL — ABNORMAL LOW (ref 13.0–17.1)
LYMPH%: 27.1 % (ref 14.0–49.0)
MCH: 29.6 pg (ref 27.2–33.4)
MCHC: 34 g/dL (ref 32.0–36.0)
MCV: 87 fL (ref 79.3–98.0)
MONO#: 0.7 10*3/uL (ref 0.1–0.9)
MONO%: 10.3 % (ref 0.0–14.0)
NEUT#: 3.9 10*3/uL (ref 1.5–6.5)
NEUT%: 61.1 % (ref 39.0–75.0)
Platelets: 361 10*3/uL (ref 140–400)
RBC: 4.09 10*6/uL — ABNORMAL LOW (ref 4.20–5.82)
RDW: 15.9 % — ABNORMAL HIGH (ref 11.0–14.6)
WBC: 6.3 10*3/uL (ref 4.0–10.3)
lymph#: 1.7 10*3/uL (ref 0.9–3.3)

## 2016-07-24 MED ORDER — SODIUM CHLORIDE 0.9 % IV SOLN
Freq: Once | INTRAVENOUS | Status: AC
  Administered 2016-07-24: 11:00:00 via INTRAVENOUS

## 2016-07-24 MED ORDER — DEXAMETHASONE SODIUM PHOSPHATE 10 MG/ML IJ SOLN
10.0000 mg | Freq: Once | INTRAMUSCULAR | Status: AC
  Administered 2016-07-24: 10 mg via INTRAVENOUS

## 2016-07-24 MED ORDER — DOCETAXEL CHEMO INJECTION 160 MG/16ML
75.0000 mg/m2 | Freq: Once | INTRAVENOUS | Status: AC
  Administered 2016-07-24: 150 mg via INTRAVENOUS
  Filled 2016-07-24: qty 15

## 2016-07-24 MED ORDER — DEXAMETHASONE SODIUM PHOSPHATE 10 MG/ML IJ SOLN
INTRAMUSCULAR | Status: AC
  Filled 2016-07-24: qty 1

## 2016-07-24 NOTE — Progress Notes (Signed)
Hematology and Oncology Follow Up Visit  Jeff Decker [id number] Jun 08, 1945 71 y.o. 07/24/2016 10:03 AM [facility] [place]   Principle Diagnosis: 71 year old gentleman with prostate cancer diagnosed in April 2018. He presented with a PSA of 262 and a Gleason score 4+5 = 9. He has metastatic disease with lymphadenopathy and bone disease.   Prior Therapy:  He is status post prostate biopsy done on April 2018 showed a Gleason score 4+5 = 9.  Current therapy:  Androgen deprivation therapy in the form of Firmagon started in April 2018.  Taxotere chemotherapy at 75 mg/m cycle one given on 06/13/2016. He is here for cycle 3 of therapy.  Interim History: Jeff Decker presents today for a follow-up visit. Since the last visit, he reports no major changes in his health. He continues to tolerate chemotherapy without complications. He denied any nausea, vomiting, neuropathy or any infusion related complications. He denied any edema or excessive fatigue. He remains active and continues to attend to his activities of daily living. He denies any bone pain or pathological fractures. He denied any excessive fatigue or tiredness. He denied any recent urination difficulties.  He does not report any headaches, blurry vision, syncope or seizures. He does not report any fevers, chills or sweats. He does not report any cough, wheezing or hemoptysis. He does not report any nausea, vomiting or abdominal pain. He does not report any frequency, urgency or hesitancy. He does not report any skeletal complaints of arthralgias or myalgias. Remaining review of system is unremarkable  Medications: I have reviewed the patient's current medications.  Current Outpatient Prescriptions  Medication Sig Dispense Refill  . aspirin EC 81 MG tablet Take 81 mg by mouth daily.    . Calcium Carb-Cholecalciferol (CALCIUM 600+D) 600-800 MG-UNIT TABS Take by mouth.    Marland Kitchen  glucosamine-chondroitin 500-400 MG tablet Take 1 tablet by mouth 3 (three) times daily.    . Multiple Vitamin (MULTI-VITAMINS) TABS Take by mouth.    Marland Kitchen omeprazole (PRILOSEC) 20 MG capsule Take 20 mg by mouth.    . prochlorperazine (COMPAZINE) 10 MG tablet Take 1 tablet (10 mg total) by mouth every 6 (six) hours as needed for nausea or vomiting. 30 tablet 1  . vardenafil (LEVITRA) 20 MG tablet Take 1 po 30 minutes before planned activity     No current facility-administered medications for this visit.      Allergies: No Known Allergies  Past Medical History, Surgical history, Social history, and Family History were reviewed and updated.   Physical Exam: Blood pressure 110/63, pulse 61, temperature 98 F (36.7 C), temperature source Oral, resp. rate 18, height 5\' 11"  (1.803 m), weight 180 lb 12.8 oz (82 kg), SpO2 100 %. ECOG: 0 General appearance: alert and cooperative appeared without distress. Head: Normocephalic, without obvious abnormality Neck: no adenopathy Lymph nodes: Cervical, supraclavicular, and axillary nodes normal. Heart:regular rate and rhythm, S1, S2 normal, no murmur, click, rub or gallop Lung:chest clear, no wheezing, rales, normal symmetric air entry. Abdomin: soft, non-tender, without masses or organomegaly no oral ulcers or lesions. EXT:no erythema, induration, or nodules   Lab Results: Lab Results  Component Value Date   WBC 6.3 07/24/2016   HGB 12.1 (L) 07/24/2016   HCT 35.6 (L) 07/24/2016   MCV 87.0 07/24/2016   PLT 361 07/24/2016     Chemistry      Component Value Date/Time   NA 140 07/03/2016 1143   K 4.0 07/03/2016 1143   CO2 24 07/03/2016 1143  BUN 15.0 07/03/2016 1143   CREATININE 0.9 07/03/2016 1143      Component Value Date/Time   CALCIUM 9.1 07/03/2016 1143   ALKPHOS 86 07/03/2016 1143   AST 24 07/03/2016 1143   ALT 30 07/03/2016 1143   BILITOT 0.42 07/03/2016 1143     Results for Jeff Decker (MRN [id number]) as of 07/24/2016  09:32  Ref. Range 06/27/2016 11:47  PSA Latest Ref Range: 0.0 - 4.0 ng/mL 1.1   Impression and Plan:  71 year old gentleman with the following issues:  1. Metastatic prostate cancer diagnosed in April 2018. He presented with a PSA of 262 and a Gleason score 4+5 = 9 in one core with multiple cores involved Gleason score 4+4 = 8. Staging workup showed metastatic disease including lymphadenopathy as well as sclerotic bony lesions and multiple areas.  He is currently receiving Taxotere chemotherapy at 75 mg/m that has been well tolerated. He is ready to proceed with cycle 3 of therapy.  His PSA showed excellent response currently at 1.1 with his pretreatment PSA of 262.  The plan is to complete 6 cycles of therapy and started Taxotere at that time.  2. IV access: His peripheral veins will be used for the time being. We will discuss the utility of Port-A-Cath of needed to in the future.  3. Antiemetics: Prescription for Compazine will be available to the patient upon starting chemotherapy.  4. Androgen deprivation therapy: This will be required indefinitely. He is currently receiving Mills Koller which will be continued indefinitely. Potentially could be switched to Lupron as well.  5. Bone directed therapy: He will be a good candidate for Xgeva and will be discussed in the future.  6. Follow-up: In 3 weeks for cycle 4 of therapy.   [facility], MD 6/26/201810:03 AM

## 2016-07-24 NOTE — Patient Instructions (Signed)
[facility] Discharge Instructions for Patients Receiving Chemotherapy  Today you received the following chemotherapy agents Docetaxel.  To help prevent nausea and vomiting after your treatment, we encourage you to take your nausea medication as directed.    If you develop nausea and vomiting that is not controlled by your nausea medication, call the clinic.   BELOW ARE SYMPTOMS THAT SHOULD BE REPORTED IMMEDIATELY:  *FEVER GREATER THAN 100.5 F  *CHILLS WITH OR WITHOUT FEVER  NAUSEA AND VOMITING THAT IS NOT CONTROLLED WITH YOUR NAUSEA MEDICATION  *UNUSUAL SHORTNESS OF BREATH  *UNUSUAL BRUISING OR BLEEDING  TENDERNESS IN MOUTH AND THROAT WITH OR WITHOUT PRESENCE OF ULCERS  *URINARY PROBLEMS  *BOWEL PROBLEMS  UNUSUAL RASH Items with * indicate a potential emergency and should be followed up as soon as possible.  Feel free to call the clinic you have any questions or concerns. The clinic phone number is [phone].  Please show the CHEMO ALERT CARD at check-in to the Emergency Department and triage nurse.   

## 2016-07-24 NOTE — Telephone Encounter (Signed)
Lab and MD @ 9:15/15 mins scheduled for 09/04/16, per 07/24/16 los. Lab and Chemo scheduled for 09/25/16, per 07/24/16 los. Injections scheduled for 09/06/16 and 09/27/16, per 07/24/16 los. Patient was given a copy of the AVS report and appointment schedule, per 07/24/16 los.

## 2016-07-24 NOTE — Progress Notes (Signed)
Patient came in to discuss billing for treatment. He had a bill with a past due balance and his award letter for [employer] for copay assistance.  Advised patient there is nothing he needs to do on his end right now. I emailed billing a second time a copy of his award letter and POE and ask that they pull the charges showing in self-pay for his treatment drugs and submit to [employer] for payment. Advised patient he will receive a new statement with a new balance.  He was very appreciative and has my card for any additional financial questions or concerns.

## 2016-07-25 LAB — PSA: Prostate Specific Ag, Serum: 0.2 ng/mL (ref 0.0–4.0)

## 2016-07-26 ENCOUNTER — Ambulatory Visit (HOSPITAL_BASED_OUTPATIENT_CLINIC_OR_DEPARTMENT_OTHER): Payer: Medicare Other

## 2016-07-26 ENCOUNTER — Encounter: Payer: Self-pay | Admitting: Oncology

## 2016-07-26 VITALS — BP 123/53 | HR 73 | Temp 97.3°F | Resp 20

## 2016-07-26 DIAGNOSIS — Z5189 Encounter for other specified aftercare: Secondary | ICD-10-CM

## 2016-07-26 DIAGNOSIS — C61 Malignant neoplasm of prostate: Secondary | ICD-10-CM

## 2016-07-26 MED ORDER — PEGFILGRASTIM INJECTION 6 MG/0.6ML ~~LOC~~
6.0000 mg | PREFILLED_SYRINGE | Freq: Once | SUBCUTANEOUS | Status: AC
Start: 2016-07-26 — End: 2016-07-26
  Administered 2016-07-26: 6 mg via SUBCUTANEOUS
  Filled 2016-07-26: qty 0.6

## 2016-07-26 NOTE — Patient Instructions (Signed)
Pegfilgrastim injection What is this medicine? PEGFILGRASTIM (PEG fil gra stim) is a long-acting granulocyte colony-stimulating factor that stimulates the growth of neutrophils, a type of white blood cell important in the body's fight against infection. It is used to reduce the incidence of fever and infection in patients with certain types of cancer who are receiving chemotherapy that affects the bone marrow, and to increase survival after being exposed to high doses of radiation. This medicine may be used for other purposes; ask your health care provider or pharmacist if you have questions. COMMON BRAND NAME(S): Neulasta What should I tell my health care provider before I take this medicine? They need to know if you have any of these conditions: -kidney disease -latex allergy -ongoing radiation therapy -sickle cell disease -skin reactions to acrylic adhesives (On-Body Injector only) -an unusual or allergic reaction to pegfilgrastim, filgrastim, other medicines, foods, dyes, or preservatives -pregnant or trying to get pregnant -breast-feeding How should I use this medicine? This medicine is for injection under the skin. If you get this medicine at home, you will be taught how to prepare and give the pre-filled syringe or how to use the On-body Injector. Refer to the patient Instructions for Use for detailed instructions. Use exactly as directed. Tell your healthcare provider immediately if you suspect that the On-body Injector may not have performed as intended or if you suspect the use of the On-body Injector resulted in a missed or partial dose. It is important that you put your used needles and syringes in a special sharps container. Do not put them in a trash can. If you do not have a sharps container, call your pharmacist or healthcare provider to get one. Talk to your pediatrician regarding the use of this medicine in children. While this drug may be prescribed for selected conditions,  precautions do apply. Overdosage: If you think you have taken too much of this medicine contact a poison control center or emergency room at once. NOTE: This medicine is only for you. Do not share this medicine with others. What if I miss a dose? It is important not to miss your dose. Call your doctor or health care professional if you miss your dose. If you miss a dose due to an On-body Injector failure or leakage, a new dose should be administered as soon as possible using a single prefilled syringe for manual use. What may interact with this medicine? Interactions have not been studied. Give your health care provider a list of all the medicines, herbs, non-prescription drugs, or dietary supplements you use. Also tell them if you smoke, drink alcohol, or use illegal drugs. Some items may interact with your medicine. This list may not describe all possible interactions. Give your health care provider a list of all the medicines, herbs, non-prescription drugs, or dietary supplements you use. Also tell them if you smoke, drink alcohol, or use illegal drugs. Some items may interact with your medicine. What should I watch for while using this medicine? You may need blood work done while you are taking this medicine. If you are going to need a MRI, CT scan, or other procedure, tell your doctor that you are using this medicine (On-Body Injector only). What side effects may I notice from receiving this medicine? Side effects that you should report to your doctor or health care professional as soon as possible: -allergic reactions like skin rash, itching or hives, swelling of the face, lips, or tongue -dizziness -fever -pain, redness, or irritation at site   where injected -pinpoint red spots on the skin -red or dark-brown urine -shortness of breath or breathing problems -stomach or side pain, or pain at the shoulder -swelling -tiredness -trouble passing urine or change in the amount of urine Side  effects that usually do not require medical attention (report to your doctor or health care professional if they continue or are bothersome): -bone pain -muscle pain This list may not describe all possible side effects. Call your doctor for medical advice about side effects. You may report side effects to FDA at 1-800-FDA-1088. Where should I keep my medicine? Keep out of the reach of children. Store pre-filled syringes in a refrigerator between 2 and 8 degrees C (36 and 46 degrees F). Do not freeze. Keep in carton to protect from light. Throw away this medicine if it is left out of the refrigerator for more than 48 hours. Throw away any unused medicine after the expiration date. NOTE: This sheet is a summary. It may not cover all possible information. If you have questions about this medicine, talk to your doctor, pharmacist, or health care provider.  2018 Elsevier/Gold Standard (2016-01-12 12:58:03)  

## 2016-07-26 NOTE — Progress Notes (Signed)
Patient came in to bring [employer] forms to be completed. Had him complete form for Tiffany and attached forms and placed in her box. Advised him she will contact him when they are ready for pick-up per his request.

## 2016-07-27 ENCOUNTER — Encounter: Payer: Self-pay | Admitting: *Deleted

## 2016-08-15 ENCOUNTER — Ambulatory Visit (HOSPITAL_BASED_OUTPATIENT_CLINIC_OR_DEPARTMENT_OTHER): Payer: Medicare Other | Admitting: Oncology

## 2016-08-15 ENCOUNTER — Other Ambulatory Visit (HOSPITAL_BASED_OUTPATIENT_CLINIC_OR_DEPARTMENT_OTHER): Payer: Medicare Other

## 2016-08-15 ENCOUNTER — Ambulatory Visit (HOSPITAL_BASED_OUTPATIENT_CLINIC_OR_DEPARTMENT_OTHER): Payer: Medicare Other

## 2016-08-15 VITALS — BP 123/65 | HR 62 | Temp 98.0°F | Resp 17 | Ht 71.0 in | Wt 186.8 lb

## 2016-08-15 DIAGNOSIS — C61 Malignant neoplasm of prostate: Secondary | ICD-10-CM | POA: Diagnosis not present

## 2016-08-15 DIAGNOSIS — Z5111 Encounter for antineoplastic chemotherapy: Secondary | ICD-10-CM

## 2016-08-15 DIAGNOSIS — C7951 Secondary malignant neoplasm of bone: Secondary | ICD-10-CM

## 2016-08-15 DIAGNOSIS — E291 Testicular hypofunction: Secondary | ICD-10-CM

## 2016-08-15 LAB — COMPREHENSIVE METABOLIC PANEL
ALT: 19 U/L (ref 0–55)
AST: 18 U/L (ref 5–34)
Albumin: 3.4 g/dL — ABNORMAL LOW (ref 3.5–5.0)
Alkaline Phosphatase: 77 U/L (ref 40–150)
Anion Gap: 9 mEq/L (ref 3–11)
BUN: 9.7 mg/dL (ref 7.0–26.0)
CO2: 24 mEq/L (ref 22–29)
Calcium: 8.6 mg/dL (ref 8.4–10.4)
Chloride: 110 mEq/L — ABNORMAL HIGH (ref 98–109)
Creatinine: 0.8 mg/dL (ref 0.7–1.3)
EGFR: 89 mL/min/{1.73_m2} — ABNORMAL LOW (ref >90)
Glucose: 92 mg/dl (ref 70–140)
Potassium: 3.8 mEq/L (ref 3.5–5.1)
Sodium: 142 mEq/L (ref 136–145)
Total Bilirubin: 0.38 mg/dL (ref 0.20–1.20)
Total Protein: 6.3 g/dL — ABNORMAL LOW (ref 6.4–8.3)

## 2016-08-15 LAB — CBC WITH DIFFERENTIAL/PLATELET
BASO%: 1 % (ref 0.0–2.0)
Basophils Absolute: 0.1 10*3/uL (ref 0.0–0.1)
EOS%: 0.3 % (ref 0.0–7.0)
Eosinophils Absolute: 0 10*3/uL (ref 0.0–0.5)
HCT: 34 % — ABNORMAL LOW (ref 38.4–49.9)
HGB: 11.6 g/dL — ABNORMAL LOW (ref 13.0–17.1)
LYMPH%: 22 % (ref 14.0–49.0)
MCH: 30.5 pg (ref 27.2–33.4)
MCHC: 34.2 g/dL (ref 32.0–36.0)
MCV: 89.3 fL (ref 79.3–98.0)
MONO#: 0.7 10*3/uL (ref 0.1–0.9)
MONO%: 10.3 % (ref 0.0–14.0)
NEUT#: 4.7 10*3/uL (ref 1.5–6.5)
NEUT%: 66.4 % (ref 39.0–75.0)
Platelets: 320 10*3/uL (ref 140–400)
RBC: 3.8 10*6/uL — ABNORMAL LOW (ref 4.20–5.82)
RDW: 17 % — ABNORMAL HIGH (ref 11.0–14.6)
WBC: 7.1 10*3/uL (ref 4.0–10.3)
lymph#: 1.6 10*3/uL (ref 0.9–3.3)

## 2016-08-15 MED ORDER — DEXAMETHASONE SODIUM PHOSPHATE 10 MG/ML IJ SOLN
INTRAMUSCULAR | Status: AC
  Filled 2016-08-15: qty 1

## 2016-08-15 MED ORDER — DEXAMETHASONE SODIUM PHOSPHATE 10 MG/ML IJ SOLN
10.0000 mg | Freq: Once | INTRAMUSCULAR | Status: AC
  Administered 2016-08-15: 10 mg via INTRAVENOUS

## 2016-08-15 MED ORDER — DOCETAXEL CHEMO INJECTION 160 MG/16ML
75.0000 mg/m2 | Freq: Once | INTRAVENOUS | Status: AC
  Administered 2016-08-15: 150 mg via INTRAVENOUS
  Filled 2016-08-15: qty 15

## 2016-08-15 MED ORDER — SODIUM CHLORIDE 0.9 % IV SOLN
Freq: Once | INTRAVENOUS | Status: AC
  Administered 2016-08-15: 10:00:00 via INTRAVENOUS

## 2016-08-15 NOTE — Progress Notes (Signed)
Hematology and Oncology Follow Up Visit  Jeff Decker [id number] 26-Feb-1945 71 y.o. 08/15/2016 8:46 AM [facility] [place]   Principle Diagnosis: 71 year old gentleman with prostate cancer diagnosed in April 2018. He presented with a PSA of 262 and a Gleason score 4+5 = 9. He has metastatic disease with lymphadenopathy and bone disease.   Prior Therapy:  He is status post prostate biopsy done on April 2018 showed a Gleason score 4+5 = 9.  Current therapy:  Androgen deprivation therapy in the form of Firmagon started in April 2018.  Taxotere chemotherapy at 75 mg/m cycle one given on 06/13/2016. He is here for cycle 4 of therapy.  Interim History: Jeff Decker presents today for a follow-up visit. Since the last visit, he reports no complaints. He continues to tolerate chemotherapy without complications. He denied any nausea, vomiting, neuropathy or any infusion related complications. He denied any edema or excessive fatigue. He denies any bone pain or pathological fractures. He denied any excessive fatigue or tiredness. He denied any recent urination difficulties. He denied any IV access issues or complications.  He does not report any headaches, blurry vision, syncope or seizures. He does not report any fevers, chills or sweats. He does not report any cough, wheezing or hemoptysis. He does not report any nausea, vomiting or abdominal pain. He does not report any frequency, urgency or hesitancy. He does not report any skeletal complaints of arthralgias or myalgias. Remaining review of system is unremarkable  Medications: I have reviewed the patient's current medications.  Current Outpatient Prescriptions  Medication Sig Dispense Refill  . aspirin EC 81 MG tablet Take 81 mg by mouth daily.    . Calcium Carb-Cholecalciferol (CALCIUM 600+D) 600-800 MG-UNIT TABS Take by mouth.    Marland Kitchen glucosamine-chondroitin 500-400 MG tablet Take 1 tablet by mouth  3 (three) times daily.    . Multiple Vitamin (MULTI-VITAMINS) TABS Take by mouth.    Marland Kitchen omeprazole (PRILOSEC) 20 MG capsule Take 20 mg by mouth.    . prochlorperazine (COMPAZINE) 10 MG tablet Take 1 tablet (10 mg total) by mouth every 6 (six) hours as needed for nausea or vomiting. 30 tablet 1  . vardenafil (LEVITRA) 20 MG tablet Take 1 po 30 minutes before planned activity     No current facility-administered medications for this visit.      Allergies: No Known Allergies  Past Medical History, Surgical history, Social history, and Family History were reviewed and updated.   Physical Exam: Blood pressure 123/65, pulse 62, temperature 98 F (36.7 C), temperature source Oral, resp. rate 17, height 5\' 11"  (1.803 m), weight 186 lb 12.8 oz (84.7 kg), SpO2 100 %. ECOG: 0 General appearance: Well-appearing gentleman without distress. Head: Normocephalic, without obvious abnormality oral ulcers or thrush. Neck: no adenopathy Lymph nodes: Cervical, supraclavicular, and axillary nodes normal. Heart:regular rate and rhythm, S1, S2 normal, no murmur, click, rub or gallop Lung:chest clear, no wheezing, rales, normal symmetric air entry. Abdomin: soft, non-tender, without masses or organomegaly no rebound or guarding. EXT:no erythema, induration, or nodules   Lab Results: Lab Results  Component Value Date   WBC 7.1 08/15/2016   HGB 11.6 (L) 08/15/2016   HCT 34.0 (L) 08/15/2016   MCV 89.3 08/15/2016   PLT 320 08/15/2016     Chemistry      Component Value Date/Time   NA 141 07/24/2016 0929   K 4.2 07/24/2016 0929   CO2 25 07/24/2016 0929   BUN 14.5 07/24/2016 0929  CREATININE 0.8 07/24/2016 0929      Component Value Date/Time   CALCIUM 9.3 07/24/2016 0929   ALKPHOS 80 07/24/2016 0929   AST 21 07/24/2016 0929   ALT 28 07/24/2016 0929   BILITOT 0.45 07/24/2016 0929     Results for Jeff Decker (MRN [id number]) as of 08/15/2016 08:31  Ref. Range 06/27/2016 11:47 07/24/2016 09:29   Prostate Specific Ag, Serum Latest Ref Range: 0.0 - 4.0 ng/mL 1.1 0.2    Impression and Plan:  71 year old gentleman with the following issues:  1. Metastatic prostate cancer diagnosed in April 2018. He presented with a PSA of 262 and a Gleason score 4+5 = 9 in one core with multiple cores involved Gleason score 4+4 = 8. Staging workup showed metastatic disease including lymphadenopathy as well as sclerotic bony lesions and multiple areas.  He is currently receiving Taxotere chemotherapy at 75 mg/m that has been well tolerated. He is ready to proceed with cycle 3 of therapy.  His PSA showed excellent response currently at 0.2  with his pretreatment PSA of 262.  He is ready to proceed with cycle 4 therapy for a total of 6 cycles.  2. IV access: His peripheral veins will be used for the time being.   3. Antiemetics: Prescription for Compazine will be available to the patient upon starting chemotherapy.  4. Androgen deprivation therapy: This will be required indefinitely. He is currently receiving Mills Koller which will be continued indefinitely. Potentially could be switched to Lupron as well.  5. Bone directed therapy: He will be a good candidate for Xgeva and will be discussed in the future.  6. Follow-up: In 3 weeks for cycle 5 of therapy.   [facility], MD 7/18/20188:46 AM

## 2016-08-15 NOTE — Patient Instructions (Signed)
[facility] Discharge Instructions for Patients Receiving Chemotherapy  Today you received the following chemotherapy agents:  Taxotere.  To help prevent nausea and vomiting after your treatment, we encourage you to take your nausea medication as directed.   If you develop nausea and vomiting that is not controlled by your nausea medication, call the clinic.   BELOW ARE SYMPTOMS THAT SHOULD BE REPORTED IMMEDIATELY:  *FEVER GREATER THAN 100.5 F  *CHILLS WITH OR WITHOUT FEVER  NAUSEA AND VOMITING THAT IS NOT CONTROLLED WITH YOUR NAUSEA MEDICATION  *UNUSUAL SHORTNESS OF BREATH  *UNUSUAL BRUISING OR BLEEDING  TENDERNESS IN MOUTH AND THROAT WITH OR WITHOUT PRESENCE OF ULCERS  *URINARY PROBLEMS  *BOWEL PROBLEMS  UNUSUAL RASH Items with * indicate a potential emergency and should be followed up as soon as possible.  Feel free to call the clinic you have any questions or concerns. The clinic phone number is [phone].  Please show the CHEMO ALERT CARD at check-in to the Emergency Department and triage nurse.  

## 2016-08-16 ENCOUNTER — Telehealth: Payer: Self-pay | Admitting: *Deleted

## 2016-08-16 LAB — PSA: Prostate Specific Ag, Serum: 0.1 ng/mL (ref 0.0–4.0)

## 2016-08-16 NOTE — Telephone Encounter (Signed)
-----   Message from Wyatt Portela, MD sent at 08/16/2016  8:19 AM EDT ----- Please let him know PSA is low again.

## 2016-08-16 NOTE — Telephone Encounter (Signed)
Spoke with patient, gave results of last PSA 

## 2016-08-17 ENCOUNTER — Ambulatory Visit (HOSPITAL_BASED_OUTPATIENT_CLINIC_OR_DEPARTMENT_OTHER): Payer: Medicare Other

## 2016-08-17 VITALS — BP 115/62 | HR 64 | Temp 97.5°F | Resp 18

## 2016-08-17 DIAGNOSIS — C61 Malignant neoplasm of prostate: Secondary | ICD-10-CM | POA: Diagnosis not present

## 2016-08-17 DIAGNOSIS — Z5189 Encounter for other specified aftercare: Secondary | ICD-10-CM

## 2016-08-17 MED ORDER — PEGFILGRASTIM INJECTION 6 MG/0.6ML ~~LOC~~
6.0000 mg | PREFILLED_SYRINGE | Freq: Once | SUBCUTANEOUS | Status: AC
  Administered 2016-08-17: 6 mg via SUBCUTANEOUS
  Filled 2016-08-17: qty 0.6

## 2016-08-17 NOTE — Patient Instructions (Signed)
Pegfilgrastim injection What is this medicine? PEGFILGRASTIM (PEG fil gra stim) is a long-acting granulocyte colony-stimulating factor that stimulates the growth of neutrophils, a type of white blood cell important in the body's fight against infection. It is used to reduce the incidence of fever and infection in patients with certain types of cancer who are receiving chemotherapy that affects the bone marrow, and to increase survival after being exposed to high doses of radiation. This medicine may be used for other purposes; ask your health care provider or pharmacist if you have questions. COMMON BRAND NAME(S): Neulasta What should I tell my health care provider before I take this medicine? They need to know if you have any of these conditions: -kidney disease -latex allergy -ongoing radiation therapy -sickle cell disease -skin reactions to acrylic adhesives (On-Body Injector only) -an unusual or allergic reaction to pegfilgrastim, filgrastim, other medicines, foods, dyes, or preservatives -pregnant or trying to get pregnant -breast-feeding How should I use this medicine? This medicine is for injection under the skin. If you get this medicine at home, you will be taught how to prepare and give the pre-filled syringe or how to use the On-body Injector. Refer to the patient Instructions for Use for detailed instructions. Use exactly as directed. Tell your healthcare provider immediately if you suspect that the On-body Injector may not have performed as intended or if you suspect the use of the On-body Injector resulted in a missed or partial dose. It is important that you put your used needles and syringes in a special sharps container. Do not put them in a trash can. If you do not have a sharps container, call your pharmacist or healthcare provider to get one. Talk to your pediatrician regarding the use of this medicine in children. While this drug may be prescribed for selected conditions,  precautions do apply. Overdosage: If you think you have taken too much of this medicine contact a poison control center or emergency room at once. NOTE: This medicine is only for you. Do not share this medicine with others. What if I miss a dose? It is important not to miss your dose. Call your doctor or health care professional if you miss your dose. If you miss a dose due to an On-body Injector failure or leakage, a new dose should be administered as soon as possible using a single prefilled syringe for manual use. What may interact with this medicine? Interactions have not been studied. Give your health care provider a list of all the medicines, herbs, non-prescription drugs, or dietary supplements you use. Also tell them if you smoke, drink alcohol, or use illegal drugs. Some items may interact with your medicine. This list may not describe all possible interactions. Give your health care provider a list of all the medicines, herbs, non-prescription drugs, or dietary supplements you use. Also tell them if you smoke, drink alcohol, or use illegal drugs. Some items may interact with your medicine. What should I watch for while using this medicine? You may need blood work done while you are taking this medicine. If you are going to need a MRI, CT scan, or other procedure, tell your doctor that you are using this medicine (On-Body Injector only). What side effects may I notice from receiving this medicine? Side effects that you should report to your doctor or health care professional as soon as possible: -allergic reactions like skin rash, itching or hives, swelling of the face, lips, or tongue -dizziness -fever -pain, redness, or irritation at site   where injected -pinpoint red spots on the skin -red or dark-brown urine -shortness of breath or breathing problems -stomach or side pain, or pain at the shoulder -swelling -tiredness -trouble passing urine or change in the amount of urine Side  effects that usually do not require medical attention (report to your doctor or health care professional if they continue or are bothersome): -bone pain -muscle pain This list may not describe all possible side effects. Call your doctor for medical advice about side effects. You may report side effects to FDA at 1-800-FDA-1088. Where should I keep my medicine? Keep out of the reach of children. Store pre-filled syringes in a refrigerator between 2 and 8 degrees C (36 and 46 degrees F). Do not freeze. Keep in carton to protect from light. Throw away this medicine if it is left out of the refrigerator for more than 48 hours. Throw away any unused medicine after the expiration date. NOTE: This sheet is a summary. It may not cover all possible information. If you have questions about this medicine, talk to your doctor, pharmacist, or health care provider.  2018 Elsevier/Gold Standard (2016-01-12 12:58:03)  

## 2016-09-04 ENCOUNTER — Telehealth: Payer: Self-pay | Admitting: Oncology

## 2016-09-04 ENCOUNTER — Ambulatory Visit (HOSPITAL_BASED_OUTPATIENT_CLINIC_OR_DEPARTMENT_OTHER): Payer: Medicare Other | Admitting: Oncology

## 2016-09-04 ENCOUNTER — Ambulatory Visit (HOSPITAL_BASED_OUTPATIENT_CLINIC_OR_DEPARTMENT_OTHER): Payer: Medicare Other

## 2016-09-04 ENCOUNTER — Other Ambulatory Visit (HOSPITAL_BASED_OUTPATIENT_CLINIC_OR_DEPARTMENT_OTHER): Payer: Medicare Other

## 2016-09-04 VITALS — BP 121/64 | HR 64 | Temp 97.9°F | Resp 17 | Ht 71.0 in | Wt 185.1 lb

## 2016-09-04 DIAGNOSIS — C61 Malignant neoplasm of prostate: Secondary | ICD-10-CM

## 2016-09-04 DIAGNOSIS — C7951 Secondary malignant neoplasm of bone: Secondary | ICD-10-CM

## 2016-09-04 DIAGNOSIS — Z5111 Encounter for antineoplastic chemotherapy: Secondary | ICD-10-CM

## 2016-09-04 DIAGNOSIS — E291 Testicular hypofunction: Secondary | ICD-10-CM

## 2016-09-04 LAB — CBC WITH DIFFERENTIAL/PLATELET
BASO%: 0.6 % (ref 0.0–2.0)
Basophils Absolute: 0 10*3/uL (ref 0.0–0.1)
EOS%: 0.1 % (ref 0.0–7.0)
Eosinophils Absolute: 0 10*3/uL (ref 0.0–0.5)
HCT: 34.2 % — ABNORMAL LOW (ref 38.4–49.9)
HGB: 11.6 g/dL — ABNORMAL LOW (ref 13.0–17.1)
LYMPH%: 27.1 % (ref 14.0–49.0)
MCH: 30.6 pg (ref 27.2–33.4)
MCHC: 33.8 g/dL (ref 32.0–36.0)
MCV: 90.4 fL (ref 79.3–98.0)
MONO#: 0.7 10*3/uL (ref 0.1–0.9)
MONO%: 10.8 % (ref 0.0–14.0)
NEUT#: 3.8 10*3/uL (ref 1.5–6.5)
NEUT%: 61.4 % (ref 39.0–75.0)
Platelets: 311 10*3/uL (ref 140–400)
RBC: 3.78 10*6/uL — ABNORMAL LOW (ref 4.20–5.82)
RDW: 17.2 % — ABNORMAL HIGH (ref 11.0–14.6)
WBC: 6.2 10*3/uL (ref 4.0–10.3)
lymph#: 1.7 10*3/uL (ref 0.9–3.3)

## 2016-09-04 LAB — COMPREHENSIVE METABOLIC PANEL
ALT: 17 U/L (ref 0–55)
AST: 18 U/L (ref 5–34)
Albumin: 3.5 g/dL (ref 3.5–5.0)
Alkaline Phosphatase: 69 U/L (ref 40–150)
Anion Gap: 6 mEq/L (ref 3–11)
BUN: 16 mg/dL (ref 7.0–26.0)
CO2: 26 mEq/L (ref 22–29)
Calcium: 9 mg/dL (ref 8.4–10.4)
Chloride: 107 mEq/L (ref 98–109)
Creatinine: 0.8 mg/dL (ref 0.7–1.3)
EGFR: 88 mL/min/{1.73_m2} — ABNORMAL LOW (ref >90)
Glucose: 84 mg/dl (ref 70–140)
Potassium: 3.7 mEq/L (ref 3.5–5.1)
Sodium: 139 mEq/L (ref 136–145)
Total Bilirubin: 0.45 mg/dL (ref 0.20–1.20)
Total Protein: 6.4 g/dL (ref 6.4–8.3)

## 2016-09-04 MED ORDER — DOCETAXEL CHEMO INJECTION 160 MG/16ML
75.0000 mg/m2 | Freq: Once | INTRAVENOUS | Status: AC
  Administered 2016-09-04: 150 mg via INTRAVENOUS
  Filled 2016-09-04: qty 15

## 2016-09-04 MED ORDER — SODIUM CHLORIDE 0.9 % IV SOLN
Freq: Once | INTRAVENOUS | Status: AC
  Administered 2016-09-04: 11:00:00 via INTRAVENOUS

## 2016-09-04 MED ORDER — DEXAMETHASONE SODIUM PHOSPHATE 10 MG/ML IJ SOLN
10.0000 mg | Freq: Once | INTRAMUSCULAR | Status: AC
  Administered 2016-09-04: 10 mg via INTRAVENOUS

## 2016-09-04 MED ORDER — DEXAMETHASONE SODIUM PHOSPHATE 10 MG/ML IJ SOLN
INTRAMUSCULAR | Status: AC
Start: 2016-09-04 — End: ?
  Filled 2016-09-04: qty 1

## 2016-09-04 NOTE — Telephone Encounter (Signed)
Gave patient avs and calendar for August and September.  °

## 2016-09-04 NOTE — Patient Instructions (Signed)
[facility] Discharge Instructions for Patients Receiving Chemotherapy  Today you received the following chemotherapy agents:  Taxotere.  To help prevent nausea and vomiting after your treatment, we encourage you to take your nausea medication as directed.   If you develop nausea and vomiting that is not controlled by your nausea medication, call the clinic.   BELOW ARE SYMPTOMS THAT SHOULD BE REPORTED IMMEDIATELY:  *FEVER GREATER THAN 100.5 F  *CHILLS WITH OR WITHOUT FEVER  NAUSEA AND VOMITING THAT IS NOT CONTROLLED WITH YOUR NAUSEA MEDICATION  *UNUSUAL SHORTNESS OF BREATH  *UNUSUAL BRUISING OR BLEEDING  TENDERNESS IN MOUTH AND THROAT WITH OR WITHOUT PRESENCE OF ULCERS  *URINARY PROBLEMS  *BOWEL PROBLEMS  UNUSUAL RASH Items with * indicate a potential emergency and should be followed up as soon as possible.  Feel free to call the clinic you have any questions or concerns. The clinic phone number is [phone].  Please show the CHEMO ALERT CARD at check-in to the Emergency Department and triage nurse.  

## 2016-09-04 NOTE — Progress Notes (Signed)
Hematology and Oncology Follow Up Visit  Jeff Decker [id number] 1945/06/08 71 y.o. 09/04/2016 9:13 AM [facility] [place]   Principle Diagnosis: 71 year old gentleman with prostate cancer diagnosed in April 2018. He presented with a PSA of 262 and a Gleason score 4+5 = 9. He has metastatic disease with lymphadenopathy and bone disease.   Prior Therapy:  He is status post prostate biopsy done on April 2018 showed a Gleason score 4+5 = 9.  Current therapy:  Androgen deprivation therapy in the form of Firmagon started in April 2018.  Taxotere chemotherapy at 75 mg/m cycle one given on 06/13/2016. He is here for cycle 5 of therapy. The plan is to complete total of 6 cycles.  Interim History: Jeff Decker presents today for a follow-up visit. Since the last visit, he reports doing well without any issues. He continues to tolerate chemotherapy well without any  nausea, vomiting, neuropathy or any infusion related complications. He did have a mild extravasation on his left hand without any delayed sequelae. He denied any edema or excessive fatigue. He denies any bone pain or pathological fractures. He denied any excessive fatigue or tiredness. He denied any recent urination difficulties. He continues to be active and spends a lot of time outdoors. His performance status remains excellent.  He does not report any headaches, blurry vision, syncope or seizures. He does not report any fevers, chills or sweats. He does not report any cough, wheezing or hemoptysis. He does not report any nausea, vomiting or abdominal pain. He does not report any frequency, urgency or hesitancy. He does not report any skeletal complaints of arthralgias or myalgias. Remaining review of system is unremarkable  Medications: I have reviewed the patient's current medications.  Current Outpatient Prescriptions  Medication Sig Dispense Refill  . aspirin EC 81 MG tablet Take 81 mg  by mouth daily.    . Calcium Carb-Cholecalciferol (CALCIUM 600+D) 600-800 MG-UNIT TABS Take by mouth.    Marland Kitchen glucosamine-chondroitin 500-400 MG tablet Take 1 tablet by mouth 3 (three) times daily.    . Multiple Vitamin (MULTI-VITAMINS) TABS Take by mouth.    Marland Kitchen omeprazole (PRILOSEC) 20 MG capsule Take 20 mg by mouth.    . prochlorperazine (COMPAZINE) 10 MG tablet Take 1 tablet (10 mg total) by mouth every 6 (six) hours as needed for nausea or vomiting. 30 tablet 1  . vardenafil (LEVITRA) 20 MG tablet Take 1 po 30 minutes before planned activity     No current facility-administered medications for this visit.      Allergies: No Known Allergies  Past Medical History, Surgical history, Social history, and Family History were reviewed and updated.   Physical Exam: Blood pressure 121/64, pulse 64, temperature 97.9 F (36.6 C), temperature source Oral, resp. rate 17, height 5\' 11"  (1.803 m), weight 185 lb 1.6 oz (84 kg), SpO2 100 %. ECOG: 0 General appearance: Alert, awake gentleman without distress. Head: Normocephalic, without obvious abnormality no oral thrush. Neck: no adenopathy Lymph nodes: Cervical, supraclavicular, and axillary nodes normal. Heart:regular rate and rhythm, S1, S2 normal, no murmur, click, rub or gallop Lung:chest clear, no wheezing, rales, normal symmetric air entry. Abdomin: soft, non-tender, without masses or organomegaly no shifting dullness or ascites. EXT:no erythema, induration, or nodules   Lab Results: Lab Results  Component Value Date   WBC 6.2 09/04/2016   HGB 11.6 (L) 09/04/2016   HCT 34.2 (L) 09/04/2016   MCV 90.4 09/04/2016   PLT 311 09/04/2016  Chemistry      Component Value Date/Time   NA 142 08/15/2016 0825   K 3.8 08/15/2016 0825   CO2 24 08/15/2016 0825   BUN 9.7 08/15/2016 0825   CREATININE 0.8 08/15/2016 0825      Component Value Date/Time   CALCIUM 8.6 08/15/2016 0825   ALKPHOS 77 08/15/2016 0825   AST 18 08/15/2016 0825    ALT 19 08/15/2016 0825   BILITOT 0.38 08/15/2016 0825      Results for Decker, Jeff (MRN [id number]) as of 09/04/2016 09:00  Ref. Range 07/24/2016 09:29 08/15/2016 08:25  Prostate Specific Ag, Serum Latest Ref Range: 0.0 - 4.0 ng/mL 0.2 0.1    Impression and Plan:  71 year old gentleman with the following issues:  1. Metastatic prostate cancer diagnosed in April 2018. He presented with a PSA of 262 and a Gleason score 4+5 = 9 in one core with multiple cores involved Gleason score 4+4 = 8. Staging workup showed metastatic disease including lymphadenopathy as well as sclerotic bony lesions and multiple areas.  He is currently receiving Taxotere chemotherapy at 75 mg/m that has been well tolerated.   His PSA showed excellent response currently at 0.1  with his pretreatment PSA of 262.  He is ready to proceed with cycle 5 therapy for a total of 6 cycles. Cycle 6 will be scheduled on 09/25/2016 which will be his last cycle.  2. IV access: His peripheral veins will be used for the time being. Mild extravasation noted without any delayed complications.  3. Antiemetics: Prescription for Compazine will be available to the patient upon starting chemotherapy.  4. Androgen deprivation therapy: This will be required indefinitely. He is currently receiving Mills Koller which will be continued indefinitely. He is receiving that at [facility] urology.  5. Bone directed therapy: He will be a good candidate for Xgeva and will be discussed in the future.  6. Follow-up: In 3 weeks for cycle 6 of therapy. He will have an M.D. follow-up in September 2018.   [facility], MD 8/7/20189:13 AM

## 2016-09-05 ENCOUNTER — Telehealth: Payer: Self-pay | Admitting: *Deleted

## 2016-09-05 LAB — PSA: Prostate Specific Ag, Serum: <0.1 ng/mL (ref 0.0–4.0)

## 2016-09-05 NOTE — Telephone Encounter (Signed)
As noted below by Dr. Shadad, I informed patient of his PSA level. He verbalized understanding.  

## 2016-09-05 NOTE — Telephone Encounter (Signed)
-----   Message from Wyatt Portela, MD sent at 09/05/2016  8:24 AM EDT ----- Please let him know his PSA is low

## 2016-09-06 ENCOUNTER — Ambulatory Visit (HOSPITAL_BASED_OUTPATIENT_CLINIC_OR_DEPARTMENT_OTHER): Payer: Medicare Other

## 2016-09-06 VITALS — BP 117/62 | HR 71 | Temp 98.0°F | Resp 18

## 2016-09-06 DIAGNOSIS — Z5189 Encounter for other specified aftercare: Secondary | ICD-10-CM | POA: Diagnosis not present

## 2016-09-06 DIAGNOSIS — C61 Malignant neoplasm of prostate: Secondary | ICD-10-CM | POA: Diagnosis not present

## 2016-09-06 MED ORDER — PEGFILGRASTIM INJECTION 6 MG/0.6ML ~~LOC~~
6.0000 mg | PREFILLED_SYRINGE | Freq: Once | SUBCUTANEOUS | Status: AC
  Administered 2016-09-06: 6 mg via SUBCUTANEOUS
  Filled 2016-09-06: qty 0.6

## 2016-09-06 NOTE — Patient Instructions (Signed)
Pegfilgrastim injection What is this medicine? PEGFILGRASTIM (PEG fil gra stim) is a long-acting granulocyte colony-stimulating factor that stimulates the growth of neutrophils, a type of white blood cell important in the body's fight against infection. It is used to reduce the incidence of fever and infection in patients with certain types of cancer who are receiving chemotherapy that affects the bone marrow, and to increase survival after being exposed to high doses of radiation. This medicine may be used for other purposes; ask your health care provider or pharmacist if you have questions. COMMON BRAND NAME(S): Neulasta What should I tell my health care provider before I take this medicine? They need to know if you have any of these conditions: -kidney disease -latex allergy -ongoing radiation therapy -sickle cell disease -skin reactions to acrylic adhesives (On-Body Injector only) -an unusual or allergic reaction to pegfilgrastim, filgrastim, other medicines, foods, dyes, or preservatives -pregnant or trying to get pregnant -breast-feeding How should I use this medicine? This medicine is for injection under the skin. If you get this medicine at home, you will be taught how to prepare and give the pre-filled syringe or how to use the On-body Injector. Refer to the patient Instructions for Use for detailed instructions. Use exactly as directed. Tell your healthcare provider immediately if you suspect that the On-body Injector may not have performed as intended or if you suspect the use of the On-body Injector resulted in a missed or partial dose. It is important that you put your used needles and syringes in a special sharps container. Do not put them in a trash can. If you do not have a sharps container, call your pharmacist or healthcare provider to get one. Talk to your pediatrician regarding the use of this medicine in children. While this drug may be prescribed for selected conditions,  precautions do apply. Overdosage: If you think you have taken too much of this medicine contact a poison control center or emergency room at once. NOTE: This medicine is only for you. Do not share this medicine with others. What if I miss a dose? It is important not to miss your dose. Call your doctor or health care professional if you miss your dose. If you miss a dose due to an On-body Injector failure or leakage, a new dose should be administered as soon as possible using a single prefilled syringe for manual use. What may interact with this medicine? Interactions have not been studied. Give your health care provider a list of all the medicines, herbs, non-prescription drugs, or dietary supplements you use. Also tell them if you smoke, drink alcohol, or use illegal drugs. Some items may interact with your medicine. This list may not describe all possible interactions. Give your health care provider a list of all the medicines, herbs, non-prescription drugs, or dietary supplements you use. Also tell them if you smoke, drink alcohol, or use illegal drugs. Some items may interact with your medicine. What should I watch for while using this medicine? You may need blood work done while you are taking this medicine. If you are going to need a MRI, CT scan, or other procedure, tell your doctor that you are using this medicine (On-Body Injector only). What side effects may I notice from receiving this medicine? Side effects that you should report to your doctor or health care professional as soon as possible: -allergic reactions like skin rash, itching or hives, swelling of the face, lips, or tongue -dizziness -fever -pain, redness, or irritation at site   where injected -pinpoint red spots on the skin -red or dark-brown urine -shortness of breath or breathing problems -stomach or side pain, or pain at the shoulder -swelling -tiredness -trouble passing urine or change in the amount of urine Side  effects that usually do not require medical attention (report to your doctor or health care professional if they continue or are bothersome): -bone pain -muscle pain This list may not describe all possible side effects. Call your doctor for medical advice about side effects. You may report side effects to FDA at 1-800-FDA-1088. Where should I keep my medicine? Keep out of the reach of children. Store pre-filled syringes in a refrigerator between 2 and 8 degrees C (36 and 46 degrees F). Do not freeze. Keep in carton to protect from light. Throw away this medicine if it is left out of the refrigerator for more than 48 hours. Throw away any unused medicine after the expiration date. NOTE: This sheet is a summary. It may not cover all possible information. If you have questions about this medicine, talk to your doctor, pharmacist, or health care provider.  2018 Elsevier/Gold Standard (2016-01-12 12:58:03)  

## 2016-09-17 ENCOUNTER — Encounter: Payer: Self-pay | Admitting: *Deleted

## 2016-09-25 ENCOUNTER — Ambulatory Visit (HOSPITAL_BASED_OUTPATIENT_CLINIC_OR_DEPARTMENT_OTHER): Payer: Medicare Other

## 2016-09-25 ENCOUNTER — Encounter: Payer: Self-pay | Admitting: Oncology

## 2016-09-25 ENCOUNTER — Telehealth: Payer: Self-pay | Admitting: Oncology

## 2016-09-25 ENCOUNTER — Ambulatory Visit (HOSPITAL_BASED_OUTPATIENT_CLINIC_OR_DEPARTMENT_OTHER): Payer: Medicare Other | Admitting: Oncology

## 2016-09-25 ENCOUNTER — Other Ambulatory Visit (HOSPITAL_BASED_OUTPATIENT_CLINIC_OR_DEPARTMENT_OTHER): Payer: Medicare Other

## 2016-09-25 ENCOUNTER — Other Ambulatory Visit: Payer: Medicare Other

## 2016-09-25 VITALS — BP 116/55 | HR 61 | Temp 97.9°F | Resp 18 | Wt 189.6 lb

## 2016-09-25 DIAGNOSIS — C61 Malignant neoplasm of prostate: Secondary | ICD-10-CM

## 2016-09-25 DIAGNOSIS — Z5111 Encounter for antineoplastic chemotherapy: Secondary | ICD-10-CM | POA: Insufficient documentation

## 2016-09-25 DIAGNOSIS — C7951 Secondary malignant neoplasm of bone: Secondary | ICD-10-CM

## 2016-09-25 LAB — CBC WITH DIFFERENTIAL/PLATELET
BASO%: 0.3 % (ref 0.0–2.0)
Basophils Absolute: 0 10*3/uL (ref 0.0–0.1)
EOS%: 0.2 % (ref 0.0–7.0)
Eosinophils Absolute: 0 10*3/uL (ref 0.0–0.5)
HCT: 32.9 % — ABNORMAL LOW (ref 38.4–49.9)
HGB: 10.6 g/dL — ABNORMAL LOW (ref 13.0–17.1)
LYMPH%: 27.8 % (ref 14.0–49.0)
MCH: 30.5 pg (ref 27.2–33.4)
MCHC: 32.2 g/dL (ref 32.0–36.0)
MCV: 94.8 fL (ref 79.3–98.0)
MONO#: 0.7 10*3/uL (ref 0.1–0.9)
MONO%: 11 % (ref 0.0–14.0)
NEUT#: 3.7 10*3/uL (ref 1.5–6.5)
NEUT%: 60.7 % (ref 39.0–75.0)
Platelets: 296 10*3/uL (ref 140–400)
RBC: 3.47 10*6/uL — ABNORMAL LOW (ref 4.20–5.82)
RDW: 16.3 % — ABNORMAL HIGH (ref 11.0–14.6)
WBC: 6.2 10*3/uL (ref 4.0–10.3)
lymph#: 1.7 10*3/uL (ref 0.9–3.3)

## 2016-09-25 LAB — COMPREHENSIVE METABOLIC PANEL
ALT: 17 U/L (ref 0–55)
AST: 18 U/L (ref 5–34)
Albumin: 3.3 g/dL — ABNORMAL LOW (ref 3.5–5.0)
Alkaline Phosphatase: 62 U/L (ref 40–150)
Anion Gap: 7 mEq/L (ref 3–11)
BUN: 15.4 mg/dL (ref 7.0–26.0)
CO2: 25 mEq/L (ref 22–29)
Calcium: 8.8 mg/dL (ref 8.4–10.4)
Chloride: 109 mEq/L (ref 98–109)
Creatinine: 0.7 mg/dL (ref 0.7–1.3)
EGFR: >90 mL/min/{1.73_m2} (ref >90)
Glucose: 102 mg/dl (ref 70–140)
Potassium: 3.8 mEq/L (ref 3.5–5.1)
Sodium: 141 mEq/L (ref 136–145)
Total Bilirubin: 0.36 mg/dL (ref 0.20–1.20)
Total Protein: 6.3 g/dL — ABNORMAL LOW (ref 6.4–8.3)

## 2016-09-25 MED ORDER — DOCETAXEL CHEMO INJECTION 160 MG/16ML
75.0000 mg/m2 | Freq: Once | INTRAVENOUS | Status: AC
  Administered 2016-09-25: 150 mg via INTRAVENOUS
  Filled 2016-09-25: qty 15

## 2016-09-25 MED ORDER — DEXAMETHASONE SODIUM PHOSPHATE 10 MG/ML IJ SOLN
INTRAMUSCULAR | Status: AC
Start: 2016-09-25 — End: ?
  Filled 2016-09-25: qty 1

## 2016-09-25 MED ORDER — DEXAMETHASONE SODIUM PHOSPHATE 10 MG/ML IJ SOLN
10.0000 mg | Freq: Once | INTRAMUSCULAR | Status: AC
  Administered 2016-09-25: 10 mg via INTRAVENOUS

## 2016-09-25 MED ORDER — SODIUM CHLORIDE 0.9 % IV SOLN
Freq: Once | INTRAVENOUS | Status: AC
  Administered 2016-09-25: 10:00:00 via INTRAVENOUS

## 2016-09-25 NOTE — Assessment & Plan Note (Signed)
This is a 71 year old gentleman with metastatic prostate cancer diagnosed in April 2018. He presented with a PSA of 262 and a Gleason score of 4+5 = 9 in one core with multiple cores involved Gleason score 4+4 = 8. Staging workup showed metastatic disease including lymphadenopathy as well as sclerotic bony lesions in multiple areas.  He is currently receiving Taxotere at 99 mg/meter squared that has been tolerated very well. His PSA has responded well and was <0.1 three weeks ago with his pretreatment PSA of 262.  Recommend the patient proceed with cycle 6 of Taxotere today which is his last planned cycle.  We will continue use his peripheral veins for the time being. He has had mild extravasation noted without any delayed complications.  The patient has Compazine at home if he needs it for nausea.  He will continue androgen deprivation therapy indefinitely. He is currently receiving Firmagon at [facility] urology.  The patient will be a good candidate for Xgeva and this will be discussed with him in the future.  Follow-up will be on 10/23/2016 to discuss future treatment plans.

## 2016-09-25 NOTE — Patient Instructions (Signed)
[facility] Discharge Instructions for Patients Receiving Chemotherapy  Today you received the following chemotherapy agents Taxotere.  To help prevent nausea and vomiting after your treatment, we encourage you to take your nausea medication as prescribed.   If you develop nausea and vomiting that is not controlled by your nausea medication, call the clinic.   BELOW ARE SYMPTOMS THAT SHOULD BE REPORTED IMMEDIATELY:  *FEVER GREATER THAN 100.5 F  *CHILLS WITH OR WITHOUT FEVER  NAUSEA AND VOMITING THAT IS NOT CONTROLLED WITH YOUR NAUSEA MEDICATION  *UNUSUAL SHORTNESS OF BREATH  *UNUSUAL BRUISING OR BLEEDING  TENDERNESS IN MOUTH AND THROAT WITH OR WITHOUT PRESENCE OF ULCERS  *URINARY PROBLEMS  *BOWEL PROBLEMS  UNUSUAL RASH Items with * indicate a potential emergency and should be followed up as soon as possible.  Feel free to call the clinic you have any questions or concerns. The clinic phone number is [phone].  Please show the CHEMO ALERT CARD at check-in to the Emergency Department and triage nurse.   

## 2016-09-25 NOTE — Telephone Encounter (Signed)
No 8/28 los °

## 2016-09-25 NOTE — Progress Notes (Signed)
[facility] Follow up:    [facility] [address] Jeff Decker [place] [address]   DIAGNOSIS:  71 year old gentleman with prostate cancer diagnosed in April 2018. He presented with a PSA of 262 and a Gleason score 4+5 = 9. He has metastatic disease with lymphadenopathy and bone disease.  SUMMARY OF ONCOLOGIC HISTORY:  No history exists.   PRIOR THERAPY: He is status post prostate biopsy done on April 2018 showed a Gleason score 4+5 = 9.  CURRENT THERAPY:   Androgen deprivation therapy in the form of Firmagon started in April 2018.  Taxotere chemotherapy at 75 mg/meter squared started on 06/13/2016. He is here for cycle 6 of therapy. The plan is to complete a total of 6 cycles.  INTERVAL HISTORY: Jeff Decker 71 y.o. male returns for Routine follow-up by himself. He continues to do well without any new complaints. He has been tolerating his chemotherapy well without any nausea, vomiting, neuropathy. He did have a mild extravasation to his right arm. Skin at that area is now very dry. He is putting lotion on it. He denies any edema or excessive fatigue. He denies bone pain and pathologic fractures. Denies any difficulty with urination. He remains very active and his performance status remains excellent.  He does not report any headaches, blurry vision, syncope or seizures. He does not report any fevers, chills or sweats. He does not report any cough, wheezing or hemoptysis. He does not report any nausea, vomiting or abdominal pain. He does not report any frequency, urgency or hesitancy. He does not report any skeletal complaints of arthralgias or myalgias. Remaining review of system is unremarkable. The patient is here for evaluation prior to cycle 6 of his chemotherapy.   Patient Active Problem List   Diagnosis Date Noted  . Encounter for antineoplastic chemotherapy 09/25/2016  . Prostate cancer ([place]) 06/04/2016  . Goals of care,  counseling/discussion 06/04/2016    has No Known Allergies.  MEDICAL HISTORY: History reviewed. No pertinent past medical history.  SURGICAL HISTORY: History reviewed. No pertinent surgical history.  SOCIAL HISTORY: Social History   Social History  . Marital status: Widowed    Spouse name: N/A  . Number of children: N/A  . Years of education: N/A   Occupational History  . Not on file.   Social History Main Topics  . Smoking status: Not on file  . Smokeless tobacco: Not on file  . Alcohol use Not on file  . Drug use: Unknown  . Sexual activity: Not on file   Other Topics Concern  . Not on file   Social History Narrative  . No narrative on file    FAMILY HISTORY: History reviewed. No pertinent family history.  Review of Systems  Constitutional: Negative.   HENT:  Negative.   Eyes: Negative.   Respiratory: Negative.   Cardiovascular: Negative.   Gastrointestinal: Negative.   Endocrine: Negative.   Genitourinary: Negative.    Musculoskeletal: Negative.   Skin:       Ry skin to right arm at site of previous extravasation.  Neurological: Negative.   Hematological: Negative.   Psychiatric/Behavioral: Negative.       PHYSICAL EXAMINATION  ECOG PERFORMANCE STATUS: 0 - Asymptomatic  Vitals:   09/25/16 0850  BP: (!) 116/55  Pulse: 61  Resp: 18  Temp: 97.9 F (36.6 C)  SpO2: 100%    Physical Exam  Constitutional: He is oriented to person, place, and time and well-developed,  well-nourished, and in no distress. No distress.  HENT:  Head: Normocephalic.  Mouth/Throat: Oropharynx is clear and moist. No oropharyngeal exudate.  Eyes: Conjunctivae are normal. Right eye exhibits no discharge. Left eye exhibits no discharge. No scleral icterus.  Neck: Normal range of motion. Neck supple.  Cardiovascular: Normal rate, regular rhythm, normal heart sounds and intact distal pulses.   Pulmonary/Chest: Effort normal and breath sounds normal. No respiratory  distress. He has no wheezes. He has no rales.  Abdominal: Soft. Bowel sounds are normal. He exhibits no distension and no mass. There is no tenderness.  Musculoskeletal: Normal range of motion. He exhibits no edema.  Lymphadenopathy:    He has no cervical adenopathy.  Neurological: He is alert and oriented to person, place, and time. He exhibits normal muscle tone. Gait normal.  Skin: Skin is warm and dry. No rash noted. He is not diaphoretic. No erythema. No pallor.  Dry skin to right anterior forearm. No redness or drainage noted.  Psychiatric: Mood, memory, affect and judgment normal.    LABORATORY DATA:  CBC    Component Value Date/Time   WBC 6.2 09/25/2016 0811   RBC 3.47 (L) 09/25/2016 0811   HGB 10.6 (L) 09/25/2016 0811   HCT 32.9 (L) 09/25/2016 0811   PLT 296 09/25/2016 0811   MCV 94.8 09/25/2016 0811   MCH 30.5 09/25/2016 0811   MCHC 32.2 09/25/2016 0811   RDW 16.3 (H) 09/25/2016 0811   LYMPHSABS 1.7 09/25/2016 0811   MONOABS 0.7 09/25/2016 0811   EOSABS 0.0 09/25/2016 0811   BASOSABS 0.0 09/25/2016 0811    CMP     Component Value Date/Time   NA 141 09/25/2016 0811   K 3.8 09/25/2016 0811   CO2 25 09/25/2016 0811   GLUCOSE 102 09/25/2016 0811   BUN 15.4 09/25/2016 0811   CREATININE 0.7 09/25/2016 0811   CALCIUM 8.8 09/25/2016 0811   PROT 6.3 (L) 09/25/2016 0811   ALBUMIN 3.3 (L) 09/25/2016 0811   AST 18 09/25/2016 0811   ALT 17 09/25/2016 0811   ALKPHOS 62 09/25/2016 0811   BILITOT 0.36 09/25/2016 0811   Results for Jeff Decker (MRN [id number]) as of 09/25/2016 12:34  Ref. Range 06/27/2016 11:47 07/24/2016 09:29 08/15/2016 08:25 09/04/2016 08:47  Prostate Specific Ag, Serum Latest Ref Range: 0.0 - 4.0 ng/mL 1.1 0.2 0.1 <0.1   PSA from today is pending.  RADIOGRAPHIC STUDIES:  No results found.   ASSESSMENT and THERAPY PLAN:   Prostate cancer [facility]) This is a 71 year old gentleman with metastatic prostate cancer diagnosed in April 2018. He presented  with a PSA of 262 and a Gleason score of 4+5 = 9 in one core with multiple cores involved Gleason score 4+4 = 8. Staging workup showed metastatic disease including lymphadenopathy as well as sclerotic bony lesions in multiple areas.  He is currently receiving Taxotere at 43 mg/meter squared that has been tolerated very well. His PSA has responded well and was <0.1 three weeks ago with his pretreatment PSA of 262.  Recommend the patient proceed with cycle 6 of Taxotere today which is his last planned cycle.  We will continue use his peripheral veins for the time being. He has had mild extravasation noted without any delayed complications.  The patient has Compazine at home if he needs it for nausea.  He will continue androgen deprivation therapy indefinitely. He is currently receiving Firmagon at [facility] urology.  The patient will be a good candidate for Xgeva and this will be  discussed with him in the future.  Follow-up will be on 10/23/2016 to discuss future treatment plans.   No orders of the defined types were placed in this encounter.   All questions were answered. The patient knows to call the clinic with any problems, questions or concerns. We can certainly see the patient much sooner if necessary.  Mikey Bussing, NP 09/25/2016

## 2016-09-26 LAB — PSA: Prostate Specific Ag, Serum: <0.1 ng/mL (ref 0.0–4.0)

## 2016-09-27 ENCOUNTER — Ambulatory Visit (HOSPITAL_BASED_OUTPATIENT_CLINIC_OR_DEPARTMENT_OTHER): Payer: Medicare Other

## 2016-09-27 VITALS — BP 112/57 | HR 70 | Temp 98.0°F | Resp 20

## 2016-09-27 DIAGNOSIS — C61 Malignant neoplasm of prostate: Secondary | ICD-10-CM | POA: Diagnosis not present

## 2016-09-27 DIAGNOSIS — Z5189 Encounter for other specified aftercare: Secondary | ICD-10-CM | POA: Diagnosis not present

## 2016-09-27 MED ORDER — PEGFILGRASTIM INJECTION 6 MG/0.6ML ~~LOC~~
6.0000 mg | PREFILLED_SYRINGE | Freq: Once | SUBCUTANEOUS | Status: AC
  Administered 2016-09-27: 6 mg via SUBCUTANEOUS
  Filled 2016-09-27: qty 0.6

## 2016-09-27 NOTE — Patient Instructions (Signed)
Pegfilgrastim injection What is this medicine? PEGFILGRASTIM (PEG fil gra stim) is a long-acting granulocyte colony-stimulating factor that stimulates the growth of neutrophils, a type of white blood cell important in the body's fight against infection. It is used to reduce the incidence of fever and infection in patients with certain types of cancer who are receiving chemotherapy that affects the bone marrow, and to increase survival after being exposed to high doses of radiation. This medicine may be used for other purposes; ask your health care provider or pharmacist if you have questions. COMMON BRAND NAME(S): Neulasta What should I tell my health care provider before I take this medicine? They need to know if you have any of these conditions: -kidney disease -latex allergy -ongoing radiation therapy -sickle cell disease -skin reactions to acrylic adhesives (On-Body Injector only) -an unusual or allergic reaction to pegfilgrastim, filgrastim, other medicines, foods, dyes, or preservatives -pregnant or trying to get pregnant -breast-feeding How should I use this medicine? This medicine is for injection under the skin. If you get this medicine at home, you will be taught how to prepare and give the pre-filled syringe or how to use the On-body Injector. Refer to the patient Instructions for Use for detailed instructions. Use exactly as directed. Tell your healthcare provider immediately if you suspect that the On-body Injector may not have performed as intended or if you suspect the use of the On-body Injector resulted in a missed or partial dose. It is important that you put your used needles and syringes in a special sharps container. Do not put them in a trash can. If you do not have a sharps container, call your pharmacist or healthcare provider to get one. Talk to your pediatrician regarding the use of this medicine in children. While this drug may be prescribed for selected conditions,  precautions do apply. Overdosage: If you think you have taken too much of this medicine contact a poison control center or emergency room at once. NOTE: This medicine is only for you. Do not share this medicine with others. What if I miss a dose? It is important not to miss your dose. Call your doctor or health care professional if you miss your dose. If you miss a dose due to an On-body Injector failure or leakage, a new dose should be administered as soon as possible using a single prefilled syringe for manual use. What may interact with this medicine? Interactions have not been studied. Give your health care provider a list of all the medicines, herbs, non-prescription drugs, or dietary supplements you use. Also tell them if you smoke, drink alcohol, or use illegal drugs. Some items may interact with your medicine. This list may not describe all possible interactions. Give your health care provider a list of all the medicines, herbs, non-prescription drugs, or dietary supplements you use. Also tell them if you smoke, drink alcohol, or use illegal drugs. Some items may interact with your medicine. What should I watch for while using this medicine? You may need blood work done while you are taking this medicine. If you are going to need a MRI, CT scan, or other procedure, tell your doctor that you are using this medicine (On-Body Injector only). What side effects may I notice from receiving this medicine? Side effects that you should report to your doctor or health care professional as soon as possible: -allergic reactions like skin rash, itching or hives, swelling of the face, lips, or tongue -dizziness -fever -pain, redness, or irritation at site   where injected -pinpoint red spots on the skin -red or dark-brown urine -shortness of breath or breathing problems -stomach or side pain, or pain at the shoulder -swelling -tiredness -trouble passing urine or change in the amount of urine Side  effects that usually do not require medical attention (report to your doctor or health care professional if they continue or are bothersome): -bone pain -muscle pain This list may not describe all possible side effects. Call your doctor for medical advice about side effects. You may report side effects to FDA at 1-800-FDA-1088. Where should I keep my medicine? Keep out of the reach of children. Store pre-filled syringes in a refrigerator between 2 and 8 degrees C (36 and 46 degrees F). Do not freeze. Keep in carton to protect from light. Throw away this medicine if it is left out of the refrigerator for more than 48 hours. Throw away any unused medicine after the expiration date. NOTE: This sheet is a summary. It may not cover all possible information. If you have questions about this medicine, talk to your doctor, pharmacist, or health care provider.  2018 Elsevier/Gold Standard (2016-01-12 12:58:03)  

## 2016-10-23 ENCOUNTER — Other Ambulatory Visit (HOSPITAL_BASED_OUTPATIENT_CLINIC_OR_DEPARTMENT_OTHER): Payer: Medicare Other

## 2016-10-23 ENCOUNTER — Ambulatory Visit (HOSPITAL_BASED_OUTPATIENT_CLINIC_OR_DEPARTMENT_OTHER): Payer: Medicare Other | Admitting: Oncology

## 2016-10-23 ENCOUNTER — Telehealth: Payer: Self-pay | Admitting: Oncology

## 2016-10-23 VITALS — BP 107/61 | HR 66 | Temp 98.5°F | Resp 18 | Ht 71.0 in | Wt 191.8 lb

## 2016-10-23 DIAGNOSIS — C61 Malignant neoplasm of prostate: Secondary | ICD-10-CM | POA: Diagnosis not present

## 2016-10-23 DIAGNOSIS — C7951 Secondary malignant neoplasm of bone: Secondary | ICD-10-CM

## 2016-10-23 DIAGNOSIS — E291 Testicular hypofunction: Secondary | ICD-10-CM | POA: Diagnosis not present

## 2016-10-23 LAB — COMPREHENSIVE METABOLIC PANEL
ALT: 13 U/L (ref 0–55)
AST: 16 U/L (ref 5–34)
Albumin: 3.4 g/dL — ABNORMAL LOW (ref 3.5–5.0)
Alkaline Phosphatase: 62 U/L (ref 40–150)
Anion Gap: 6 mEq/L (ref 3–11)
BUN: 13.8 mg/dL (ref 7.0–26.0)
CO2: 26 mEq/L (ref 22–29)
Calcium: 8.9 mg/dL (ref 8.4–10.4)
Chloride: 107 mEq/L (ref 98–109)
Creatinine: 0.7 mg/dL (ref 0.7–1.3)
EGFR: >90 mL/min/{1.73_m2} (ref >90)
Glucose: 77 mg/dl (ref 70–140)
Potassium: 4 mEq/L (ref 3.5–5.1)
Sodium: 140 mEq/L (ref 136–145)
Total Bilirubin: 0.45 mg/dL (ref 0.20–1.20)
Total Protein: 6.3 g/dL — ABNORMAL LOW (ref 6.4–8.3)

## 2016-10-23 LAB — CBC WITH DIFFERENTIAL/PLATELET
BASO%: 0.7 % (ref 0.0–2.0)
Basophils Absolute: 0 10*3/uL (ref 0.0–0.1)
EOS%: 2.8 % (ref 0.0–7.0)
Eosinophils Absolute: 0.2 10*3/uL (ref 0.0–0.5)
HCT: 33.3 % — ABNORMAL LOW (ref 38.4–49.9)
HGB: 11.2 g/dL — ABNORMAL LOW (ref 13.0–17.1)
LYMPH%: 27.4 % (ref 14.0–49.0)
MCH: 30.9 pg (ref 27.2–33.4)
MCHC: 33.5 g/dL (ref 32.0–36.0)
MCV: 92.2 fL (ref 79.3–98.0)
MONO#: 0.8 10*3/uL (ref 0.1–0.9)
MONO%: 12.8 % (ref 0.0–14.0)
NEUT#: 3.5 10*3/uL (ref 1.5–6.5)
NEUT%: 56.3 % (ref 39.0–75.0)
Platelets: 248 10*3/uL (ref 140–400)
RBC: 3.61 10*6/uL — ABNORMAL LOW (ref 4.20–5.82)
RDW: 15.3 % — ABNORMAL HIGH (ref 11.0–14.6)
WBC: 6.2 10*3/uL (ref 4.0–10.3)
lymph#: 1.7 10*3/uL (ref 0.9–3.3)

## 2016-10-23 NOTE — Progress Notes (Signed)
Hematology and Oncology Follow Up Visit  Jeff Decker [id number] [birth year] 71 y.o. 10/23/2016 8:21 AM [name] [place]   Principle Diagnosis: 71 year old gentleman with prostate cancer diagnosed in April 2018. He presented with a PSA of 262 and a Gleason score 4+5 = 9. He has metastatic disease with lymphadenopathy and bone disease.   Prior Therapy:  He is status post prostate biopsy done on April 2018 showed a Gleason score 4+5 = 9.  Taxotere chemotherapy at 75 mg/m cycle one given on 06/13/2016. He is S/P 6 cycles concluded on 09/25/2016 .  Current therapy:  Androgen deprivation therapy in the form of Firmagon started in April 2018. He is currently receiving Lupron every 4 months at [facility].    Interim History: Jeff Decker presents today for a follow-up visit. Since the last visit, he received his sixth cycle of chemotherapy without complications. He reports very little to no residual side effects. He reports some mild edema in his lower extremities without neuropathy. He denies any bone pain or pathological fractures. He denied any excessive fatigue or tiredness. He denied any recent urination difficulties. He continues to be active and his performance status remains excellent. He is gaining weight and would like to exercise more regularly.  He does not report any headaches, blurry vision, syncope or seizures. He does not report any fevers, chills or sweats. He does not report any cough, wheezing or hemoptysis. He does not report any nausea, vomiting or abdominal pain. He does not report any frequency, urgency or hesitancy. He does not report any skeletal complaints of arthralgias or myalgias. Remaining review of system is unremarkable  Medications: I have reviewed the patient's current medications.  Current Outpatient Prescriptions  Medication Sig Dispense Refill  . aspirin EC 81 MG tablet Take 81 mg by mouth daily.    .  Calcium Carb-Cholecalciferol (CALCIUM 600+D) 600-800 MG-UNIT TABS Take by mouth.    Marland Kitchen glucosamine-chondroitin 500-400 MG tablet Take 1 tablet by mouth 3 (three) times daily.    . Multiple Vitamin (MULTI-VITAMINS) TABS Take by mouth.    Marland Kitchen omeprazole (PRILOSEC) 20 MG capsule Take 20 mg by mouth.    . prochlorperazine (COMPAZINE) 10 MG tablet Take 1 tablet (10 mg total) by mouth every 6 (six) hours as needed for nausea or vomiting. 30 tablet 1  . vardenafil (LEVITRA) 20 MG tablet Take 1 po 30 minutes before planned activity     No current facility-administered medications for this visit.      Allergies: No Known Allergies  Past Medical History, Surgical history, Social history, and Family History were reviewed and updated.   Physical Exam: Blood pressure 107/61, pulse 66, temperature 98.5 F (36.9 C), temperature source Oral, resp. rate 18, height 5\' 11"  (1.803 m), weight 191 lb 12.8 oz (87 kg), SpO2 100 %. ECOG: 0 General appearance: Well-appearing gentleman without distress. Head: Normocephalic, without obvious abnormality no oral ulcers or lesions. Neck: no adenopathy Lymph nodes: Cervical, supraclavicular, and axillary nodes normal. Heart:regular rate and rhythm, S1, S2 normal, no murmur, click, rub or gallop Lung:chest clear, no wheezing, rales, normal symmetric air entry. Abdomin: soft, non-tender, without masses or organomegaly no rebound or guarding. EXT:no erythema, induration, or nodules   Lab Results: Lab Results  Component Value Date   WBC 6.2 10/23/2016   HGB 11.2 (L) 10/23/2016   HCT 33.3 (L) 10/23/2016   MCV 92.2 10/23/2016   PLT 248 10/23/2016     Chemistry  Component Value Date/Time   NA 141 09/25/2016 0811   K 3.8 09/25/2016 0811   CO2 25 09/25/2016 0811   BUN 15.4 09/25/2016 0811   CREATININE 0.7 09/25/2016 0811      Component Value Date/Time   CALCIUM 8.8 09/25/2016 0811   ALKPHOS 62 09/25/2016 0811   AST 18 09/25/2016 0811   ALT 17 09/25/2016  0811   BILITOT 0.36 09/25/2016 0811       Results for Jeff Decker, Jeff Decker (MRN [id number]) as of 10/23/2016 07:57  Ref. Range 08/15/2016 08:25 09/04/2016 08:47 09/25/2016 08:11  Prostate Specific Ag, Serum Latest Ref Range: 0.0 - 4.0 ng/mL 0.1 <0.1 <0.1    Impression and Plan:  71 year old gentleman with the following issues:  1. Metastatic prostate cancer diagnosed in April 2018. He presented with a PSA of 262 and a Gleason score 4+5 = 9 in one core with multiple cores involved Gleason score 4+4 = 8. Staging workup showed metastatic disease including lymphadenopathy as well as sclerotic bony lesions and multiple areas.  He is S/P Taxotere chemotherapy at 75 mg/m for a total of 6 cycles completed in August 2018 and was well tolerated.   His PSA on 09/25/2016 appears to be less than 0.1 indicating excellent response to therapy.  The plan is to continue with active surveillance at this point and repeat a staging workup in October 2018. He understands he may require additional therapy in the future if his PSA starts to rise.  2. IV access: Chemotherapy via peripheral veins. He did have a mild extravasation which shows cause no further complications.  3. Antiemetics: Nausea has not been an issue.  4. Androgen deprivation therapy: This will be be continued indefinitely. He is receiving Lupron at [facility].  5. Bone directed therapy: He will be a good candidate for Xgeva and will be discussed in the future after a repeat bone scan.  6. Follow-up: In 4 weeks for follow his progress and repeat staging workup.   Zola Button, MD 9/25/20188:21 AM

## 2016-10-23 NOTE — Telephone Encounter (Signed)
Gave patient AVS and calendar of upcoming October appointments °

## 2016-10-24 ENCOUNTER — Telehealth: Payer: Self-pay | Admitting: *Deleted

## 2016-10-24 LAB — PSA: Prostate Specific Ag, Serum: <0.1 ng/mL (ref 0.0–4.0)

## 2016-10-24 NOTE — Telephone Encounter (Signed)
Spoke with patient, gave results of last PSA 

## 2016-10-24 NOTE — Telephone Encounter (Signed)
-----   Message from Wyatt Portela, MD sent at 10/24/2016  8:03 AM EDT ----- Please call his PSA

## 2016-10-24 NOTE — Telephone Encounter (Signed)
Lm for patient to call desk nurse.  Re:psa

## 2016-11-20 ENCOUNTER — Ambulatory Visit (HOSPITAL_COMMUNITY): Payer: Medicare Other

## 2016-11-20 ENCOUNTER — Encounter (HOSPITAL_COMMUNITY)
Admission: RE | Admit: 2016-11-20 | Discharge: 2016-11-20 | Disposition: A | Discharge status: Home / Self Care | Payer: Medicare Other | Source: Ambulatory Visit | Attending: Oncology | Admitting: Oncology

## 2016-11-20 ENCOUNTER — Other Ambulatory Visit (HOSPITAL_BASED_OUTPATIENT_CLINIC_OR_DEPARTMENT_OTHER): Payer: Medicare Other

## 2016-11-20 ENCOUNTER — Encounter (HOSPITAL_COMMUNITY): Payer: Self-pay

## 2016-11-20 ENCOUNTER — Ambulatory Visit (HOSPITAL_COMMUNITY)
Admission: RE | Admit: 2016-11-20 | Discharge: 2016-11-20 | Disposition: A | Discharge status: Home / Self Care | Payer: Medicare Other | Source: Ambulatory Visit | Attending: Oncology | Admitting: Oncology

## 2016-11-20 DIAGNOSIS — C61 Malignant neoplasm of prostate: Secondary | ICD-10-CM

## 2016-11-20 DIAGNOSIS — I700 Atherosclerosis of aorta: Secondary | ICD-10-CM | POA: Insufficient documentation

## 2016-11-20 DIAGNOSIS — C7951 Secondary malignant neoplasm of bone: Secondary | ICD-10-CM | POA: Insufficient documentation

## 2016-11-20 LAB — CBC WITH DIFFERENTIAL/PLATELET
BASO%: 0.6 % (ref 0.0–2.0)
Basophils Absolute: 0 10*3/uL (ref 0.0–0.1)
EOS%: 3.6 % (ref 0.0–7.0)
Eosinophils Absolute: 0.1 10*3/uL (ref 0.0–0.5)
HCT: 37.6 % — ABNORMAL LOW (ref 38.4–49.9)
HGB: 12.6 g/dL — ABNORMAL LOW (ref 13.0–17.1)
LYMPH%: 33.1 % (ref 14.0–49.0)
MCH: 29.9 pg (ref 27.2–33.4)
MCHC: 33.5 g/dL (ref 32.0–36.0)
MCV: 89.2 fL (ref 79.3–98.0)
MONO#: 0.4 10*3/uL (ref 0.1–0.9)
MONO%: 9.9 % (ref 0.0–14.0)
NEUT#: 2.2 10*3/uL (ref 1.5–6.5)
NEUT%: 52.8 % (ref 39.0–75.0)
Platelets: 206 10*3/uL (ref 140–400)
RBC: 4.22 10*6/uL (ref 4.20–5.82)
RDW: 13.7 % (ref 11.0–14.6)
WBC: 4.2 10*3/uL (ref 4.0–10.3)
lymph#: 1.4 10*3/uL (ref 0.9–3.3)

## 2016-11-20 LAB — COMPREHENSIVE METABOLIC PANEL
ALT: 16 U/L (ref 0–55)
AST: 18 U/L (ref 5–34)
Albumin: 3.9 g/dL (ref 3.5–5.0)
Alkaline Phosphatase: 58 U/L (ref 40–150)
Anion Gap: 9 mEq/L (ref 3–11)
BUN: 16.7 mg/dL (ref 7.0–26.0)
CO2: 24 mEq/L (ref 22–29)
Calcium: 9.1 mg/dL (ref 8.4–10.4)
Chloride: 106 mEq/L (ref 98–109)
Creatinine: 0.8 mg/dL (ref 0.7–1.3)
EGFR: >60 mL/min/{1.73_m2} (ref >60)
Glucose: 110 mg/dl (ref 70–140)
Potassium: 4.2 mEq/L (ref 3.5–5.1)
Sodium: 139 mEq/L (ref 136–145)
Total Bilirubin: 0.4 mg/dL (ref 0.20–1.20)
Total Protein: 7.1 g/dL (ref 6.4–8.3)

## 2016-11-20 MED ORDER — IOPAMIDOL (ISOVUE-300) INJECTION 61%
INTRAVENOUS | Status: AC
  Filled 2016-11-20: qty 100

## 2016-11-20 MED ORDER — TECHNETIUM TC 99M MEDRONATE IV KIT
21.7000 | PACK | Freq: Once | INTRAVENOUS | Status: AC | PRN
  Administered 2016-11-20: 21.7 via INTRAVENOUS

## 2016-11-20 MED ORDER — IOPAMIDOL (ISOVUE-300) INJECTION 61%
100.0000 mL | Freq: Once | INTRAVENOUS | Status: AC | PRN
  Administered 2016-11-20: 100 mL via INTRAVENOUS

## 2016-11-21 LAB — PSA: Prostate Specific Ag, Serum: <0.1 ng/mL (ref 0.0–4.0)

## 2016-11-22 ENCOUNTER — Telehealth: Payer: Self-pay | Admitting: Oncology

## 2016-11-22 ENCOUNTER — Ambulatory Visit (HOSPITAL_BASED_OUTPATIENT_CLINIC_OR_DEPARTMENT_OTHER): Payer: Medicare Other | Admitting: Oncology

## 2016-11-22 VITALS — BP 118/68 | HR 64 | Temp 98.0°F | Resp 20 | Ht 71.0 in | Wt 187.7 lb

## 2016-11-22 DIAGNOSIS — E291 Testicular hypofunction: Secondary | ICD-10-CM

## 2016-11-22 DIAGNOSIS — C7951 Secondary malignant neoplasm of bone: Secondary | ICD-10-CM

## 2016-11-22 DIAGNOSIS — C61 Malignant neoplasm of prostate: Secondary | ICD-10-CM

## 2016-11-22 NOTE — Progress Notes (Signed)
Hematology and Oncology Follow Up Visit  Jeff Decker [id number] 10-25-1945 71 y.o. 11/22/2016 9:00 AM [facility] [place]   Principle Diagnosis: 71 year old Jeff Decker with prostate cancer diagnosed in April 2018. He presented with a PSA of 262 and a Gleason score 4+5 = 9. He has metastatic disease with lymphadenopathy and bone disease.   Prior Therapy:  He is status post prostate biopsy done on April 2018 showed a Gleason score 4+5 = 9.  Taxotere chemotherapy at 75 mg/m cycle one given on 06/13/2016. He is S/P 6 cycles concluded on 09/25/2016 .  Current therapy:  Androgen deprivation therapy in the form of Firmagon started in April 2018. He is currently receiving Lupron every 4 months at [facility].    Interim History: Jeff Decker presents today for a follow-up visit. Since the last visit, he continues to be in excellent health and shape without any residual complications.  His lower extremity edema and neuropathy completely resolved. He denies any bone pain or pathological fractures. He denied any excessive fatigue or tiredness. He denied any recent urination difficulties. He continues to be active and his performance status remains excellent. He is gaining weight and would like to exercise more regularly.  Is able to ambulate without any falls or syncope.  He does not report any headaches, blurry vision, syncope or seizures. He does not report any fevers, chills or sweats. He does not report any cough, wheezing or hemoptysis. He does not report any nausea, vomiting or abdominal pain. He does not report any frequency, urgency or hesitancy. He does not report any skeletal complaints of arthralgias or myalgias. Remaining review of system is unremarkable  Medications: I have reviewed the patient's current medications.  Current Outpatient Prescriptions  Medication Sig Dispense Refill  . aspirin EC 81 MG tablet Take 81 mg by mouth daily.     . Calcium Carb-Cholecalciferol (CALCIUM 600+D) 600-800 MG-UNIT TABS Take by mouth.    Marland Kitchen glucosamine-chondroitin 500-400 MG tablet Take 1 tablet by mouth 3 (three) times daily.    . Multiple Vitamin (MULTI-VITAMINS) TABS Take by mouth.    Marland Kitchen omeprazole (PRILOSEC) 20 MG capsule Take 20 mg by mouth.    . prochlorperazine (COMPAZINE) 10 MG tablet Take 1 tablet (10 mg total) by mouth every 6 (six) hours as needed for nausea or vomiting. 30 tablet 1  . vardenafil (LEVITRA) 20 MG tablet Take 1 po 30 minutes before planned activity     No current facility-administered medications for this visit.      Allergies: No Known Allergies  Past Medical History, Surgical history, Social history, and Family History were reviewed and updated.   Physical Exam: Blood pressure 118/68, pulse Jeff, temperature 98 F (36.7 C), temperature source Oral, resp. rate 20, height 5\' 11"  (1.803 m), weight 187 lb 11.2 oz (85.1 kg), SpO2 99 %. ECOG: 0 General appearance: Alert, awake Jeff Decker without distress. Head: Normocephalic, without obvious abnormality no oral thrush or ulcers. Neck: no adenopathy Lymph nodes: Cervical, supraclavicular, and axillary nodes normal. Heart:regular rate and rhythm, S1, S2 normal, no murmur, click, rub or gallop Lung:chest clear, no wheezing, rales, normal symmetric air entry. Abdomin: soft, non-tender, without masses or organomegaly no shifting dullness or ascites. EXT:no erythema, induration, or nodules   Lab Results: Lab Results  Component Value Date   WBC 4.2 11/20/2016   HGB 12.6 (L) 11/20/2016   HCT 37.6 (L) 11/20/2016   MCV 89.2 11/20/2016   PLT 206 11/20/2016     Chemistry  Component Value Date/Time   NA 139 11/20/2016 0800   K 4.2 11/20/2016 0800   CO2 24 11/20/2016 0800   BUN 16.7 11/20/2016 0800   CREATININE 0.8 11/20/2016 0800      Component Value Date/Time   CALCIUM 9.1 11/20/2016 0800   ALKPHOS 58 11/20/2016 0800   AST 18 11/20/2016 0800   ALT 16  11/20/2016 0800   BILITOT 0.40 11/20/2016 0800      Results for Jeff, Decker (MRN [id number]) as of 11/22/2016 08:43  Ref. Range 09/25/2016 08:11 10/23/2016 07:50 11/20/2016 08:00  Prostate Specific Ag, Serum Latest Ref Range: 0.0 - 4.0 ng/mL <0.1 <0.1 <0.1   EXAM: CT ABDOMEN AND PELVIS WITH CONTRAST  TECHNIQUE: Multidetector CT imaging of the abdomen and pelvis was performed using the standard protocol following bolus administration of intravenous contrast.  CONTRAST:  153mL ISOVUE-300 IOPAMIDOL (ISOVUE-300) INJECTION 61%  COMPARISON:  05/23/2016  FINDINGS: Lower chest: Coronary artery calcification is evident. Tiny left lower lobe nodule seen on the prior study has resolved.  Hepatobiliary: Tiny hypoattenuating lesion in the central liver (image 15 series 2) is too small to characterize but unchanged in the interval. Liver otherwise unremarkable. There is no evidence for gallstones, gallbladder wall thickening, or pericholecystic fluid. No intrahepatic or extrahepatic biliary dilation.  Pancreas: No focal mass lesion. No dilatation of the main duct. No intraparenchymal cyst. No peripancreatic edema.  Spleen: No splenomegaly. No focal mass lesion.  Adrenals/Urinary Tract: No adrenal nodule or mass. Kidneys unremarkable No evidence for hydroureter. The urinary bladder appears normal for the degree of distention.  Stomach/Bowel: Stomach is nondistended. No gastric wall thickening. No evidence of outlet obstruction. Duodenum is normally positioned as is the ligament of Treitz. No small bowel wall thickening. No small bowel dilatation. The terminal ileum is normal. The appendix is normal. No gross colonic mass. No colonic wall thickening. No substantial diverticular change.  Vascular/Lymphatic: There is abdominal aortic atherosclerosis without aneurysm. There is no gastrohepatic or hepatoduodenal ligament lymphadenopathy. No intraperitoneal or  retroperitoneal lymphadenopathy. No pelvic sidewall lymphadenopathy. 18 mm left pelvic sidewall lymph node measured on the prior study is now 5 mm. 24 mm right pelvic sidewall short axis lymph node measured previously is now 5 mm short axis. 10 mm short axis lymph nodes seen on the prior study at the proximal left common femoral artery is now 3 mm.  Reproductive: Prostatomegaly seen previously has decreased in the interval  Other: No intraperitoneal free fluid.  Musculoskeletal: Similar appearance 2.1 cm right iliac sclerotic lesion. No substantial change 1.7 cm L5 lesion. Other sclerotic bony lesions are similar without new or progressive disease evident.  IMPRESSION: 1. Interval resolution of pelvic lymphadenopathy seen previously. 2. Stable appearance of scattered sclerotic bone lesions. 3. Interval decrease in size of the prostate gland. 4. Interval resolution of the tiny left lower lobe pulmonary nodule seen previously. 5. No new or progressive findings on today's study. 6.  Aortic Atherosclerois (ICD10-170.0)  EXAM: NUCLEAR MEDICINE WHOLE BODY BONE SCAN  TECHNIQUE: Whole body anterior and posterior images were obtained approximately 3 hours after intravenous injection of radiopharmaceutical.  RADIOPHARMACEUTICALS:  21.7 mCi Technetium-13m MDP IV  COMPARISON:  05/23/2016  Radiographic correlation:  CT abdomen and pelvis 11/20/2016  FINDINGS: Foci of abnormal increased trace localization at the posteromedial RIGHT iliac bone again identified.  Decreased uptake at the posterior RIGHT acetabulum as seen on the previous exam.  Foci of abnormal increased tracer localization at the posterior RIGHT seventh rib, lumbar spine, and RIGHT  femoral neck 0 no longer identified.  Persistent visualization of a focus of abnormal tracer localization at either the inferior LEFT scapula or the posterior LEFT sixth rib.  No new sites of abnormal osseous tracer  accumulation identified.  Expected urinary tract and soft tissue distribution of tracer.  IMPRESSION: Overall decrease in the number and degree of uptake of tracer at multiple previously identified sites of abnormal tracer accumulation/osseous metastases.  No new scintigraphic abnormalities    Impression and Plan:  Jeff Decker with the following issues:  1. Metastatic prostate cancer diagnosed in April 2018. He presented with a PSA of 262 and a Gleason score 4+5 = 9 in one core with multiple cores involved Gleason score 4+4 = 8. Staging workup showed metastatic disease including lymphadenopathy as well as sclerotic bony lesions and multiple areas.  He is S/P Taxotere chemotherapy at 75 mg/m for a total of 6 cycles completed in August 2018 and was well tolerated.   His PSA on November 20, 2016 continues to be undetectable at this time.  CT scan and a bone scan at the time but continues to show excellent response to therapy with very little residual disease.  The plan is to continue with active surveillance at this point and add different salvage therapy if his PSA starts to rise.   2 Androgen deprivation therapy: This will be be continued indefinitely. He is receiving Lupron at [facility].  3. Bone directed therapy: He will be a good candidate for Xgeva.  He is currently on calcium and vitamin D and has a dentist appointment in the near future.  We will last obtain dental clearance and will consider starting Xgeva in the future.  4. Follow-up: In 3 months.   Zola Button, MD 10/25/20189:00 AM

## 2016-11-22 NOTE — Telephone Encounter (Signed)
Gave avs and calendar for January 2019 °

## 2017-02-20 ENCOUNTER — Inpatient Hospital Stay: Payer: Medicare Other | Attending: Oncology

## 2017-02-20 DIAGNOSIS — Z9221 Personal history of antineoplastic chemotherapy: Secondary | ICD-10-CM | POA: Diagnosis not present

## 2017-02-20 DIAGNOSIS — Z79899 Other long term (current) drug therapy: Secondary | ICD-10-CM | POA: Insufficient documentation

## 2017-02-20 DIAGNOSIS — C61 Malignant neoplasm of prostate: Secondary | ICD-10-CM

## 2017-02-20 DIAGNOSIS — C7951 Secondary malignant neoplasm of bone: Secondary | ICD-10-CM | POA: Diagnosis not present

## 2017-02-20 LAB — COMPREHENSIVE METABOLIC PANEL
ALT: 24 U/L (ref 0–55)
AST: 19 U/L (ref 5–34)
Albumin: 4 g/dL (ref 3.5–5.0)
Alkaline Phosphatase: 69 U/L (ref 40–150)
Anion gap: 7 (ref 3–11)
BUN: 12 mg/dL (ref 7–26)
CO2: 28 mmol/L (ref 22–29)
Calcium: 9.6 mg/dL (ref 8.4–10.4)
Chloride: 104 mmol/L (ref 98–109)
Creatinine, Ser: 0.83 mg/dL (ref 0.70–1.30)
GFR calc Af Amer: >60 mL/min (ref >60)
GFR calc non Af Amer: >60 mL/min (ref >60)
Glucose, Bld: 107 mg/dL (ref 70–140)
Potassium: 4.6 mmol/L (ref 3.5–5.1)
Sodium: 139 mmol/L (ref 136–145)
Total Bilirubin: 0.5 mg/dL (ref 0.2–1.2)
Total Protein: 7.4 g/dL (ref 6.4–8.3)

## 2017-02-20 LAB — CBC WITH DIFFERENTIAL/PLATELET
Basophils Absolute: 0 10*3/uL (ref 0.0–0.1)
Basophils Relative: 1 %
Eosinophils Absolute: 0.3 10*3/uL (ref 0.0–0.5)
Eosinophils Relative: 6 %
HCT: 38.5 % (ref 38.4–49.9)
Hemoglobin: 12.9 g/dL — ABNORMAL LOW (ref 13.0–17.1)
Lymphocytes Relative: 33 %
Lymphs Abs: 1.6 10*3/uL (ref 0.9–3.3)
MCH: 29.2 pg (ref 27.2–33.4)
MCHC: 33.5 g/dL (ref 32.0–36.0)
MCV: 87.1 fL (ref 79.3–98.0)
Monocytes Absolute: 0.5 10*3/uL (ref 0.1–0.9)
Monocytes Relative: 11 %
Neutro Abs: 2.5 10*3/uL (ref 1.5–6.5)
Neutrophils Relative %: 49 %
Platelets: 243 10*3/uL (ref 140–400)
RBC: 4.42 MIL/uL (ref 4.20–5.82)
RDW: 13.9 % (ref 11.0–15.6)
WBC: 5 10*3/uL (ref 4.0–10.3)

## 2017-02-21 LAB — PROSTATE-SPECIFIC AG, SERUM (LABCORP): Prostate Specific Ag, Serum: <0.1 ng/mL (ref 0.0–4.0)

## 2017-02-21 LAB — TESTOSTERONE: Testosterone: <3 ng/dL — ABNORMAL LOW (ref 264–916)

## 2017-02-22 ENCOUNTER — Inpatient Hospital Stay (HOSPITAL_BASED_OUTPATIENT_CLINIC_OR_DEPARTMENT_OTHER): Payer: Medicare Other | Admitting: Oncology

## 2017-02-22 ENCOUNTER — Telehealth: Payer: Self-pay | Admitting: Oncology

## 2017-02-22 VITALS — BP 117/65 | HR 63 | Temp 97.8°F | Resp 18 | Ht 71.0 in | Wt 188.6 lb

## 2017-02-22 DIAGNOSIS — Z9221 Personal history of antineoplastic chemotherapy: Secondary | ICD-10-CM | POA: Diagnosis not present

## 2017-02-22 DIAGNOSIS — C61 Malignant neoplasm of prostate: Secondary | ICD-10-CM | POA: Diagnosis not present

## 2017-02-22 DIAGNOSIS — C7951 Secondary malignant neoplasm of bone: Secondary | ICD-10-CM

## 2017-02-22 DIAGNOSIS — Z79899 Other long term (current) drug therapy: Secondary | ICD-10-CM

## 2017-02-22 NOTE — Progress Notes (Signed)
Hematology and Oncology Follow Up Visit  Jeff Decker [id number] Jun 07, 1945 72 y.o. 02/22/2017 1:10 PM [facility] [place]   Principle Diagnosis: 72 year old man with advanced prostate cancer diagnosed in April 2018.  He has metastatic disease to the bone and bulky adenopathy.  He presented with a PSA of 262 and a Gleason score 4+5 = 9. .   Prior Therapy:  Taxotere chemotherapy at 75 mg/m cycle one given on 06/13/2016. He is S/P 6 cycles concluded on 09/25/2016.   Current therapy:  Lupron 45 mg every 6 months given at [facility].    Interim History: [name] is here for a follow-up visit.  He reports no major changes in his health since the last visit.  He continues to receive Lupron without any delayed complications.  He does not report any excessive fatigue or tiredness.  He does report occasional hot flashes and weight gain.  He denies any residual complications related to chemotherapy.  He denies any neuropathy or GI toxicities.  He remains active and attends to activities of daily living.  He denies any bone pain or pathological fractures.  He did report an episode of constipation but is manageable.  He does not report any headaches, blurry vision, syncope or seizures. He does not report any fevers, chills or sweats. He does not report any cough, wheezing or hemoptysis. He does not report any nausea, vomiting or abdominal pain. He does not report any frequency, urgency or hesitancy. He does not report any skeletal complaints of arthralgias or myalgias.  He does not report any skin rashes or lesions.  He does not report any petechiae or rash.  Remaining review of system is negative.  Medications: I have reviewed the patient's current medications.  Current Outpatient Medications  Medication Sig Dispense Refill  . aspirin EC 81 MG tablet Take 81 mg by mouth daily.    . Calcium Carb-Cholecalciferol (CALCIUM 600+D) 600-800 MG-UNIT  TABS Take by mouth.    Marland Kitchen glucosamine-chondroitin 500-400 MG tablet Take 1 tablet by mouth 3 (three) times daily.    . Multiple Vitamin (MULTI-VITAMINS) TABS Take by mouth.    Marland Kitchen omeprazole (PRILOSEC) 20 MG capsule Take 20 mg by mouth.    . prochlorperazine (COMPAZINE) 10 MG tablet Take 1 tablet (10 mg total) by mouth every 6 (six) hours as needed for nausea or vomiting. 30 tablet 1  . vardenafil (LEVITRA) 20 MG tablet Take 1 po 30 minutes before planned activity     No current facility-administered medications for this visit.      Allergies: No Known Allergies  Past Medical History, Surgical history, Social history, and Family History reviewed and remain unchanged.   Physical Exam: Blood pressure 117/65, pulse 63, temperature 97.8 F (36.6 C), temperature source Oral, resp. rate 18, height 5\' 11"  (1.803 m), weight 188 lb 9.6 oz (85.5 kg), SpO2 99 %. ECOG: 0 General appearance: Well-appearing gentleman appears comfortable. Head: Normocephalic, without obvious abnormality  Oral mucosa: Without thrush or ulcers. Eyes: No scleral icterus. Lymph nodes: Cervical, supraclavicular, and axillary nodes normal. Heart:regular rate and rhythm, S1, S2 normal, no murmur, click, rub or gallop Lung:chest clear to auscultation in all lung fields, no wheezing.  Abdomin: soft, non-tender, without masses or organomegaly no rebound or guarding. Musculoskeletal: No joint effusions or deformity.  Full range of motion noted in all extremities.   Lab Results: Lab Results  Component Value Date   WBC 5.0 02/20/2017   HGB 12.9 (L) 02/20/2017  HCT 38.5 02/20/2017   MCV 87.1 02/20/2017   PLT 243 02/20/2017     Chemistry      Component Value Date/Time   NA 139 02/20/2017 0743   NA 139 11/20/2016 0800   K 4.6 02/20/2017 0743   K 4.2 11/20/2016 0800   CL 104 02/20/2017 0743   CO2 28 02/20/2017 0743   CO2 24 11/20/2016 0800   BUN 12 02/20/2017 0743   BUN 16.7 11/20/2016 0800   CREATININE 0.83  02/20/2017 0743   CREATININE 0.8 11/20/2016 0800      Component Value Date/Time   CALCIUM 9.6 02/20/2017 0743   CALCIUM 9.1 11/20/2016 0800   ALKPHOS 69 02/20/2017 0743   ALKPHOS 58 11/20/2016 0800   AST 19 02/20/2017 0743   AST 18 11/20/2016 0800   ALT 24 02/20/2017 0743   ALT 16 11/20/2016 0800   BILITOT 0.5 02/20/2017 0743   BILITOT 0.40 11/20/2016 0800       Impression and Plan:  72 year old gentleman with the following issues:  1.  Advanced prostate cancer presented with lymphadenopathy and bone disease diagnosed in April 2018.  His PSA of 262 and a Gleason score 4+5 = 9 at the time of biopsy.  He is S/P Taxotere chemotherapy at 75 mg/m for a total of 6 cycles completed in August 2018 and was well tolerated.   His PSA continues to be undetectable at this time.  Imaging studies obtained in October 2018 were reviewed again and discussed with the patient and showed excellent response to therapy.  The natural course of this disease was discussed today with the patient and he understands he has an incurable malignancy but his treatment has been very successful at this time.  He will require different salvage therapy in the future if his PSA starts to rise.  I will be in the form of Zytiga, Xtandi and potentially different chemotherapy.  At the time being of I recommend continue observation and surveillance.   2 Androgen deprivation therapy: He is currently receiving Lupron at 45 mg every 6 months.  I emphasized the importance of continuing this therapy indefinitely.  His testosterone level is adequately castrate.  3. Bone directed therapy: He is at risk of developing skeletal related events related to prostate cancer in the bone.  He is currently on calcium and vitamin D.  Delton See would be an option in the future after obtaining dental clearance.  We will continue to address that with him in future visits.  4. Follow-up: In 6 months.  15  minutes was spent with the patient  face-to-face today.  More than 50% of time was dedicated to patient counseling, education and answering his questions regarding his prognosis and plan of care.   Zola Button, MD 1/25/20191:10 PM

## 2017-02-22 NOTE — Telephone Encounter (Signed)
Scheduled appt per 12/5 los - Gave patient AVS and calender per los.   

## 2017-04-19 DIAGNOSIS — D649 Anemia, unspecified: Secondary | ICD-10-CM | POA: Insufficient documentation

## 2017-04-30 ENCOUNTER — Encounter: Payer: Self-pay | Admitting: Family Medicine

## 2017-05-08 ENCOUNTER — Encounter: Payer: Self-pay | Admitting: Gastroenterology

## 2017-06-27 ENCOUNTER — Other Ambulatory Visit: Payer: Self-pay

## 2017-06-27 ENCOUNTER — Ambulatory Visit (AMBULATORY_SURGERY_CENTER): Payer: Self-pay | Admitting: *Deleted

## 2017-06-27 VITALS — Ht 71.0 in | Wt 186.0 lb

## 2017-06-27 DIAGNOSIS — Z1211 Encounter for screening for malignant neoplasm of colon: Secondary | ICD-10-CM

## 2017-06-27 DIAGNOSIS — Z800 Family history of malignant neoplasm of digestive organs: Secondary | ICD-10-CM

## 2017-06-27 MED ORDER — NA SULFATE-K SULFATE-MG SULF 17.5-3.13-1.6 GM/177ML PO SOLN: ORAL | 0 refills | Status: DC

## 2017-06-27 NOTE — Progress Notes (Signed)
Patient declined emmi video during PV today.

## 2017-06-27 NOTE — Progress Notes (Signed)
No egg or soy allergy known to patient  No issues with past sedation with any surgeries  or procedures, no intubation problems  No diet pills per patient No home 02 use per patient  No blood thinners per patient  Pt denies issues with constipation  No A fib or A flutter  EMMI video sent to pt's e mail  Coupon for Suprep given to patient, $50.00

## 2017-07-01 ENCOUNTER — Encounter: Payer: Self-pay | Admitting: Gastroenterology

## 2017-07-12 ENCOUNTER — Ambulatory Visit (AMBULATORY_SURGERY_CENTER): Payer: Medicare Other | Admitting: Gastroenterology

## 2017-07-12 ENCOUNTER — Other Ambulatory Visit: Payer: Self-pay

## 2017-07-12 ENCOUNTER — Encounter: Payer: Self-pay | Admitting: Gastroenterology

## 2017-07-12 VITALS — BP 108/61 | HR 54 | Temp 96.8°F | Resp 14 | Ht 71.0 in | Wt 186.0 lb

## 2017-07-12 DIAGNOSIS — D122 Benign neoplasm of ascending colon: Secondary | ICD-10-CM | POA: Diagnosis not present

## 2017-07-12 DIAGNOSIS — Z800 Family history of malignant neoplasm of digestive organs: Secondary | ICD-10-CM

## 2017-07-12 DIAGNOSIS — Z1211 Encounter for screening for malignant neoplasm of colon: Secondary | ICD-10-CM | POA: Diagnosis not present

## 2017-07-12 MED ORDER — SODIUM CHLORIDE 0.9 % IV SOLN: 500.0000 mL | Freq: Once | INTRAVENOUS | Status: DC

## 2017-07-12 NOTE — Progress Notes (Signed)
A and O x3. Report to RN. Tolerated MAC anesthesia well.

## 2017-07-12 NOTE — Progress Notes (Signed)
Pt. Reports no change in his medical or surgical history since his pre-visit 06/27/2017.

## 2017-07-12 NOTE — Op Note (Signed)
[facility] Patient Name: Jeff Decker Procedure Date: 07/12/2017 9:09 AM MRN: [id number] Endoscopist: Mallie Mussel L. Jeff Decker , MD Age: 72 Referring MD:  Date of Birth: 1945/08/13 Gender: Male Account #: [id number] Procedure:                Colonoscopy Indications:              Colon cancer screening in patient at increased                            risk: Colorectal cancer in father Medicines:                Monitored Anesthesia Care Procedure:                Pre-Anesthesia Assessment:                           - Prior to the procedure, a History and Physical                            was performed, and patient medications and                            allergies were reviewed. The patient's tolerance of                            previous anesthesia was also reviewed. The risks                            and benefits of the procedure and the sedation                            options and risks were discussed with the patient.                            All questions were answered, and informed consent                            was obtained. Anticoagulants: The patient has taken                            aspirin. It was decided not to withhold this                            medication prior to the procedure. ASA Grade                            Assessment: II - A patient with mild systemic                            disease. After reviewing the risks and benefits,                            the patient was deemed in satisfactory condition to  undergo the procedure.                           After obtaining informed consent, the colonoscope                            was passed under direct vision. Throughout the                            procedure, the patient's blood pressure, pulse, and                            oxygen saturations were monitored continuously. The                            Colonoscope was introduced through the anus and                     advanced to the the cecum, identified by                            appendiceal orifice and ileocecal valve. The                            colonoscopy was performed without difficulty. The                            patient tolerated the procedure well. The quality                            of the bowel preparation was good. The ileocecal                            valve, appendiceal orifice, and rectum were                            photographed. The quality of the bowel preparation                            was evaluated using the BBPS [facility] Bowel                            Preparation Scale) with scores of: Right Colon = 2,                            Transverse Colon = 2 and Left Colon = 2. The total                            BBPS score equals 6. Scope In: 9:28:49 AM Scope Out: 9:43:26 AM Scope Withdrawal Time: 0 hours 10 minutes 52 seconds  Total Procedure Duration: 0 hours 14 minutes 37 seconds  Findings:                 The perianal and digital rectal examinations were  normal.                           A 5 mm polyp was found in the ascending colon. The                            polyp was sessile. The polyp was removed with a                            cold snare. Resection and retrieval were complete.                           Multiple small-mouthed diverticula were found in                            the left colon.                           There was a lipoma at the ileocecal valve.                           Internal hemorrhoids were found. The hemorrhoids                            were Grade I (internal hemorrhoids that do not                            prolapse).                           The exam was otherwise without abnormality on                            direct and retroflexion views. Complications:            No immediate complications. Estimated Blood Loss:     Estimated blood loss: none. Estimated blood loss                             was minimal. Impression:               - One 5 mm polyp in the ascending colon, removed                            with a cold snare. Resected and retrieved.                           - Diverticulosis in the left colon.                           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                           - Internal hemorrhoids.                           -  The examination was otherwise normal on direct                            and retroflexion views. Recommendation:           - Patient has a contact number available for                            emergencies. The signs and symptoms of potential                            delayed complications were discussed with the                            patient. Return to normal activities tomorrow.                            Written discharge instructions were provided to the                            patient.                           - Resume previous diet.                           - Continue present medications.                           - Await pathology results.                           - Repeat colonoscopy in 5 years for screening                            purposes. Riniyah Speich L. Jeff Carrow, MD 07/12/2017 9:57:55 AM This report has been signed electronically.

## 2017-07-12 NOTE — Patient Instructions (Signed)
*   handouts given on polyps and hemorrhoids given, resume normal diet, repeat colon in five years*  YOU HAD AN ENDOSCOPIC PROCEDURE TODAY AT [facility]:   Refer to the procedure report that was given to you for any specific questions about what was found during the examination.  If the procedure report does not answer your questions, please call your gastroenterologist to clarify.  If you requested that your care partner not be given the details of your procedure findings, then the procedure report has been included in a sealed envelope for you to review at your convenience later.  YOU SHOULD EXPECT: Some feelings of bloating in the abdomen. Passage of more gas than usual.  Walking can help get rid of the air that was put into your GI tract during the procedure and reduce the bloating. If you had a lower endoscopy (such as a colonoscopy or flexible sigmoidoscopy) you may notice spotting of blood in your stool or on the toilet paper. If you underwent a bowel prep for your procedure, you may not have a normal bowel movement for a few days.  Please Note:  You might notice some irritation and congestion in your nose or some drainage.  This is from the oxygen used during your procedure.  There is no need for concern and it should clear up in a day or so.  SYMPTOMS TO REPORT IMMEDIATELY:   Following lower endoscopy (colonoscopy or flexible sigmoidoscopy):  Excessive amounts of blood in the stool  Significant tenderness or worsening of abdominal pains  Swelling of the abdomen that is new, acute  Fever of 100F or higher  For urgent or emergent issues, a gastroenterologist can be reached at any hour by calling [phone].   DIET:  We do recommend a small meal at first, but then you may proceed to your regular diet.  Drink plenty of fluids but you should avoid alcoholic beverages for 24 hours.  ACTIVITY:  You should plan to take it easy for the rest of today and you should NOT DRIVE  or use heavy machinery until tomorrow (because of the sedation medicines used during the test).    FOLLOW UP: Our staff will call the number listed on your records the next business day following your procedure to check on you and address any questions or concerns that you may have regarding the information given to you following your procedure. If we do not reach you, we will leave a message.  However, if you are feeling well and you are not experiencing any problems, there is no need to return our call.  We will assume that you have returned to your regular daily activities without incident.  If any biopsies were taken you will be contacted by phone or by letter within the next 1-3 weeks.  Please call us at [phone] if you have not heard about the biopsies in 3 weeks.    SIGNATURES/CONFIDENTIALITY: You and/or your care partner have signed paperwork which will be entered into your electronic medical record.  These signatures attest to the fact that that the information above on your After Visit Summary has been reviewed and is understood.  Full responsibility of the confidentiality of this discharge information lies with you and/or your care-partner.

## 2017-07-12 NOTE — Progress Notes (Signed)
Called to room to assist during endoscopic procedure.  Patient ID and intended procedure confirmed with present staff. Received instructions for my participation in the procedure from the performing physician.  

## 2017-07-15 ENCOUNTER — Telehealth: Payer: Self-pay | Admitting: *Deleted

## 2017-07-15 NOTE — Telephone Encounter (Signed)
  Follow up Call-  Call back number 07/12/2017  Post procedure Call Back phone  # [phone]  Permission to leave phone message No  Some recent data might be hidden     Patient questions:  Do you have a fever, pain , or abdominal swelling? No. Pain Score  0 *  Have you tolerated food without any problems? Yes.    Have you been able to return to your normal activities? Yes.    Do you have any questions about your discharge instructions: Diet   No. Medications  No. Follow up visit  No.  Do you have questions or concerns about your Care? No.  Actions: * If pain score is 4 or above: No action needed, pain <4.

## 2017-07-15 NOTE — Telephone Encounter (Signed)
  Follow up Call-  Call back number 07/12/2017  Post procedure Call Back phone  # [phone]  Permission to leave phone message No  Some recent data might be hidden     No answer at # given.  Unable to leave message.  VM box not set up.

## 2017-07-17 ENCOUNTER — Encounter: Payer: Self-pay | Admitting: Gastroenterology

## 2017-08-15 ENCOUNTER — Inpatient Hospital Stay: Payer: Medicare Other | Attending: Oncology

## 2017-08-15 DIAGNOSIS — C7981 Secondary malignant neoplasm of breast: Secondary | ICD-10-CM | POA: Insufficient documentation

## 2017-08-15 DIAGNOSIS — C61 Malignant neoplasm of prostate: Secondary | ICD-10-CM | POA: Diagnosis not present

## 2017-08-15 LAB — CBC WITH DIFFERENTIAL (CANCER CENTER ONLY)
Band Neutrophils: 0 %
Basophils Absolute: 0.1 10*3/uL (ref 0.0–0.1)
Basophils Relative: 1 %
Blasts: 0 %
Eosinophils Absolute: 0.2 10*3/uL (ref 0.0–0.5)
Eosinophils Relative: 3 %
HCT: 38.3 % — ABNORMAL LOW (ref 38.4–49.9)
Hemoglobin: 12.8 g/dL — ABNORMAL LOW (ref 13.0–17.1)
Lymphocytes Relative: 34 %
Lymphs Abs: 2.1 10*3/uL (ref 0.9–3.3)
MCH: 29.6 pg (ref 27.2–33.4)
MCHC: 33.4 g/dL (ref 32.0–36.0)
MCV: 88.7 fL (ref 79.3–98.0)
Metamyelocytes Relative: 0 %
Monocytes Absolute: 0.7 10*3/uL (ref 0.1–0.9)
Monocytes Relative: 11 %
Myelocytes: 0 %
Neutro Abs: 3.1 10*3/uL (ref 1.5–6.5)
Neutrophils Relative %: 51 %
Other: 0 %
Platelet Count: 251 10*3/uL (ref 140–400)
Promyelocytes Relative: 0 %
RBC: 4.32 MIL/uL (ref 4.20–5.82)
RDW: 13 % (ref 11.0–14.6)
WBC Count: 6.2 10*3/uL (ref 4.0–10.3)
nRBC: 0 /100 WBC

## 2017-08-15 LAB — CMP (CANCER CENTER ONLY)
ALT: 21 U/L (ref 0–44)
AST: 19 U/L (ref 15–41)
Albumin: 4.2 g/dL (ref 3.5–5.0)
Alkaline Phosphatase: 81 U/L (ref 38–126)
Anion gap: 8 (ref 5–15)
BUN: 15 mg/dL (ref 8–23)
CO2: 29 mmol/L (ref 22–32)
Calcium: 9.3 mg/dL (ref 8.9–10.3)
Chloride: 102 mmol/L (ref 98–111)
Creatinine: 0.87 mg/dL (ref 0.61–1.24)
GFR, Est AFR Am: >60 mL/min (ref >60)
GFR, Estimated: >60 mL/min (ref >60)
Glucose, Bld: 106 mg/dL — ABNORMAL HIGH (ref 70–99)
Potassium: 4.5 mmol/L (ref 3.5–5.1)
Sodium: 139 mmol/L (ref 135–145)
Total Bilirubin: 0.5 mg/dL (ref 0.3–1.2)
Total Protein: 7.5 g/dL (ref 6.5–8.1)

## 2017-08-16 LAB — PROSTATE-SPECIFIC AG, SERUM (LABCORP): Prostate Specific Ag, Serum: <0.1 ng/mL (ref 0.0–4.0)

## 2017-08-22 ENCOUNTER — Telehealth: Payer: Self-pay | Admitting: Oncology

## 2017-08-22 ENCOUNTER — Inpatient Hospital Stay (HOSPITAL_BASED_OUTPATIENT_CLINIC_OR_DEPARTMENT_OTHER): Payer: Medicare Other | Admitting: Oncology

## 2017-08-22 VITALS — BP 133/73 | HR 60 | Temp 97.9°F | Resp 17 | Ht 71.0 in | Wt 189.0 lb

## 2017-08-22 DIAGNOSIS — C7951 Secondary malignant neoplasm of bone: Secondary | ICD-10-CM

## 2017-08-22 DIAGNOSIS — C61 Malignant neoplasm of prostate: Secondary | ICD-10-CM | POA: Diagnosis not present

## 2017-08-22 NOTE — Progress Notes (Signed)
Hematology and Oncology Follow Up Visit  Jeff Decker [id number] 02/29/1945 72 y.o. 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  He was found to have PSA of 262 and a Gleason score 4+5 = 9 at the time of diagnosis.   Prior Therapy:  Taxotere chemotherapy at 75 mg/m cycle one given on 06/13/2016. He is S/P 6 cycles concluded on 09/25/2016.   Current therapy:  Lupron 45 mg every 6 months given at [facility].  His next Lupron will be in December 2019 to be administered at the cancer center.    Interim History: Jeff Decker is here for a follow-up.  Since the last visit, he continues to feel reasonably well without any residual complications related to chemotherapy.  His appetite and performance status remain excellent without any residual toxicities related to chemotherapy.  He denies any excessive fatigue or tiredness, frontal neuropathy or changes in his activity level.  He denies any flank pain or bone pain.  His appetite and weight remain excellent.  He does not report any headaches, blurry vision, syncope or seizures.  He denies any alteration mental status or confusion.  He does not report any fevers, chills or sweats. He does not report any cough, wheezing or hemoptysis.  Denies any chest pain palpitation orthopnea.  He does not report any nausea, vomiting or abdominal pain. He does not report any frequency, urgency or hesitancy. He does not report any arthralgias or myalgias.  He does not report any skin rashes or lesions.  He does not report any petechiae or rash.  He denies any bleeding or clotting tendencies.  Remaining review of system is negative.  Medications: I have reviewed the patient's current medications.  Current Outpatient Medications  Medication Sig Dispense Refill  . aspirin  EC 81 MG tablet Take 81 mg by mouth daily.    . Calcium Carb-Cholecalciferol (CALCIUM 600+D) 600-800 MG-UNIT TABS Take by mouth.    . fexofenadine (ALLEGRA) 180 MG tablet Take 180 mg by mouth daily.    Marland Kitchen glucosamine-chondroitin 500-400 MG tablet Take 1 tablet by mouth 3 (three) times daily.    . Multiple Vitamin (MULTI-VITAMINS) TABS Take by mouth.    . Omega-3 Fatty Acids (FISH OIL) 1200 MG CAPS Take 1 capsule by mouth daily.    . ranitidine (ZANTAC) 75 MG tablet Take 75 mg by mouth daily.     Current Facility-Administered Medications  Medication Dose Route Frequency Provider Last Rate Last Dose  . 0.9 %  sodium chloride infusion  500 mL Intravenous Once Doran Stabler, MD         Allergies:  Allergies  Allergen Reactions  . Statins Other (See Comments)    Tried 3 different statins    Past Medical History, Surgical history, Social history, and Family History reviewed and remain unchanged.   Physical Exam: Blood pressure 133/73, pulse 60, temperature 97.9 F (36.6 C), temperature source Oral, resp. rate 17, height 5\' 11"  (1.803 m), weight 189 lb (85.7 kg), SpO2 100 %.   ECOG: 0 General appearance: Alert, awake gentleman without distress Head: Atraumatic without abnormalities. Oral mucosa: Mucous membranes are moist and pink. Eyes: Sclera anicteric. Lymph nodes: No lymphadenopathy noted in the cervical, supraclavicular, or axillary nodes Heart:regular rate and rhythm, S1, S2.  No leg edema noted. Lung:chest clear to auscultation in all lung fields, no wheezing.  Abdomin: Soft, nontender without any rebound or guarding. Musculoskeletal: No clubbing or cyanosis.   Lab Results: Lab Results  Component Value Date   WBC 6.2 08/15/2017   HGB 12.8 (L) 08/15/2017   HCT 38.3 (L) 08/15/2017   MCV 88.7 08/15/2017   PLT 251 08/15/2017     Chemistry      Component Value Date/Time   NA 139 08/15/2017 0807   NA 139 11/20/2016 0800   K 4.5 08/15/2017 0807   K 4.2 11/20/2016  0800   CL 102 08/15/2017 0807   CO2 29 08/15/2017 0807   CO2 24 11/20/2016 0800   BUN 15 08/15/2017 0807   BUN 16.7 11/20/2016 0800   CREATININE 0.87 08/15/2017 0807   CREATININE 0.8 11/20/2016 0800      Component Value Date/Time   CALCIUM 9.3 08/15/2017 0807   CALCIUM 9.1 11/20/2016 0800   ALKPHOS 81 08/15/2017 0807   ALKPHOS 58 11/20/2016 0800   AST 19 08/15/2017 0807   AST 18 11/20/2016 0800   ALT 21 08/15/2017 0807   ALT 16 11/20/2016 0800   BILITOT 0.5 08/15/2017 0807   BILITOT 0.40 11/20/2016 0800     Results for Jeff Decker (MRN [id number]) as of 08/22/2017 08:08  Ref. Range 02/20/2017 07:43 08/15/2017 08:07  Prostate Specific Ag, Serum Latest Ref Range: 0.0 - 4.0 ng/mL <0.1 <0.1    Impression and Plan:  72 year old man with:  1.  Castration-sensitive prostate cancer diagnosed in April 2018.  He was found to have PSA of 262 and a Gleason score 4+5 = 9 and lymphadenopathy.  He completed Taxotere chemotherapy with excellent response and complete resolution of his lymphadenopathy and limited bone disease.  The natural course of this disease was reviewed today with the patient as well as potential treatment options.  His PSA remains undetectable and second line therapy in the form of Zytiga, Xtandi, and other agents were reviewed.  Given the fact that his PSA remains undetectable we will continue to monitor him closely and reintroduce additional therapy if his PSA starts to rise.   2 Androgen deprivation therapy: He has to continue androgen deprivation therapy at the cancer center.  He will receive Lupron 30 mg subcutaneously every 4 months starting in December 2019.  3. Bone directed therapy: Remains on calcium and vitamin D.  If he develops a worsening bony metastasis, Delton See will be introduced.  4. Follow-up: Be in December 2019 to follow his progress.  15  minutes was spent with the patient face-to-face today.  More than 50% of time was dedicated to patient  counseling, education and coordinating his future plan of care.   Zola Button, MD 7/25/20198:08 AM

## 2017-08-22 NOTE — Telephone Encounter (Signed)
Gave patient avs and calendar of upcoming dec appts.

## 2017-09-24 ENCOUNTER — Ambulatory Visit: Payer: Medicare Other | Admitting: Cardiology

## 2017-09-24 ENCOUNTER — Encounter: Payer: Self-pay | Admitting: Cardiology

## 2017-09-24 VITALS — BP 142/76 | HR 70 | Ht 71.0 in | Wt 191.2 lb

## 2017-09-24 DIAGNOSIS — Z01812 Encounter for preprocedural laboratory examination: Secondary | ICD-10-CM

## 2017-09-24 DIAGNOSIS — I200 Unstable angina: Secondary | ICD-10-CM

## 2017-09-24 DIAGNOSIS — E785 Hyperlipidemia, unspecified: Secondary | ICD-10-CM | POA: Diagnosis not present

## 2017-09-24 MED ORDER — NITROGLYCERIN 0.4 MG SL SUBL: 0.4000 mg | SUBLINGUAL_TABLET | SUBLINGUAL | 3 refills | Status: DC | PRN

## 2017-09-24 NOTE — Progress Notes (Signed)
Cardiology Office Note:    Date:  09/24/2017   ID:  Jeff Decker, DOB August 25, 1945, MRN [id number]  PCP:  Nickola Major, MD  Cardiologist:  No primary care provider on file.   Referring MD: Josefa Half*    History of Present Illness:    Jeff Decker is a 72 y.o. male with hyperlipidemia, 10-year ASCVD risk 23% with prior myalgias on statin therapy taking omega-3, GERD, anemia, prostate cancer here for the evaluation of shortness of breath and chest pain.  Prior hemoglobin 12.9.,  LDL 156, triglycerides 168.  Had CP 3 x in past month. ?if consipation or gas. When lay down, hurting, TUMS, pain did not go away. NTG relieved pain. Had prior prox LAD CAD/Stent. Dr. Glade Lloyd, early stent 1998.  Prior stent squeeze and and balloon under arm.   Cath 02/25/96 - Prox LAD, 4.0 x 15, AVE stent.  Dyspnea with stairs x 1 month. Felt this prior to last episode.   Past Medical History:  Diagnosis Date  . Allergy   . Arthritis   . Cancer ([place]) 03/2016   prostate and bone , chemo completed  . GERD (gastroesophageal reflux disease)   . Myocardial infarction ([place]) 1989    Past Surgical History:  Procedure Laterality Date  . COLONOSCOPY  2006  . CORONARY ANGIOPLASTY WITH STENT PLACEMENT    . HEMORRHOID SURGERY    . ORIF SHOULDER FRACTURE     72 years old  . PROSTATE BIOPSY  03/2016  . TONSILLECTOMY AND ADENOIDECTOMY     72 years old    Current Medications: Current Meds  Medication Sig  . aspirin EC 81 MG tablet Take 81 mg by mouth daily.  . Calcium Carb-Cholecalciferol (CALCIUM 600+D) 600-800 MG-UNIT TABS Take by mouth.  . fexofenadine (ALLEGRA) 180 MG tablet Take 180 mg by mouth daily.  Marland Kitchen glucosamine-chondroitin 500-400 MG tablet Take 1 tablet by mouth 3 (three) times daily.  . Multiple Vitamin (MULTI-VITAMINS) TABS Take by mouth.  . Omega-3 Fatty Acids (FISH OIL) 1200 MG CAPS Take 1 capsule by mouth daily.  Marland Kitchen omeprazole (PRILOSEC) 20 MG capsule Take 20 mg by mouth  daily before breakfast.  . ranitidine (ZANTAC) 75 MG tablet Take 75 mg by mouth daily.   Current Facility-Administered Medications for the 09/24/17 encounter (Office Visit) with Jerline Pain, MD  Medication  . 0.9 %  sodium chloride infusion     Allergies:   Statins   Social History   Socioeconomic History  . Marital status: Widowed    Spouse name: Not on file  . Number of children: Not on file  . Years of education: Not on file  . Highest education level: Not on file  Occupational History  . Not on file  Social Needs  . Financial resource strain: Not on file  . Food insecurity:    Worry: Not on file    Inability: Not on file  . Transportation needs:    Medical: Not on file    Non-medical: Not on file  Tobacco Use  . Smoking status: Former Smoker    Last attempt to quit: 08/01/1986    Years since quitting: 31.1  . Smokeless tobacco: Never Used  Substance and Sexual Activity  . Alcohol use: Yes    Alcohol/week: 4.0 standard drinks    Types: 4 Cans of beer per week  . Drug use: Never  . Sexual activity: Not on file  Lifestyle  . Physical activity:    Days per  week: Not on file    Minutes per session: Not on file  . Stress: Not on file  Relationships  . Social connections:    Talks on phone: Not on file    Gets together: Not on file    Attends religious service: Not on file    Active member of club or organization: Not on file    Attends meetings of clubs or organizations: Not on file    Relationship status: Not on file  Other Topics Concern  . Not on file  Social History Narrative  . Not on file     Family History: The patient's family history includes Breast cancer in his mother; Colon cancer (age of onset: 67) in his father; Prostate cancer in his father. There is no history of Stomach cancer, Esophageal cancer, or Rectal cancer.  ROS:   Please see the history of present illness.     All other systems reviewed and are negative.  EKGs/Labs/Other Studies  Reviewed:    The following studies were reviewed today: Prior cardiac catheterization reviewed, lab work reviewed, normal creatinine, office note referral reviewed.  EKG:  EKG is  ordered today.  The ekg ordered today 09/24/2017 demonstrates sinus rhythm with no other significant abnormalities  Recent Labs: 08/15/2017: ALT 21; BUN 15; Creatinine 0.87; Hemoglobin 12.8; Platelet Count 251; Potassium 4.5; Sodium 139  Recent Lipid Panel No results found for: CHOL, TRIG, HDL, CHOLHDL, VLDL, LDLCALC, LDLDIRECT  Physical Exam:    VS:  BP (!) 142/76   Pulse 70   Ht 5\' 11"  (1.803 m)   Wt 191 lb 3.2 oz (86.7 kg)   BMI 26.67 kg/m     Wt Readings from Last 3 Encounters:  09/24/17 191 lb 3.2 oz (86.7 kg)  08/22/17 189 lb (85.7 kg)  07/12/17 186 lb (84.4 kg)     GEN:  Well nourished, well developed in no acute distress HEENT: Normal NECK: No JVD; No carotid bruits LYMPHATICS: No lymphadenopathy CARDIAC: RRR, no murmurs, rubs, gallops RESPIRATORY:  Clear to auscultation without rales, wheezing or rhonchi  ABDOMEN: Soft, non-tender, non-distended MUSCULOSKELETAL:  No edema; No deformity excellent radial pulses SKIN: Warm and dry NEUROLOGIC:  Alert and oriented x 3 PSYCHIATRIC:  Normal affect   ASSESSMENT:    1. Unstable angina ([place])   2. Pre-procedure lab exam   3. Hyperlipidemia, unspecified hyperlipidemia type    PLAN:    In order of problems listed above:  Unstable angina  -With his escalating symptoms that are somewhat similar to his previous feelings prior to his proximal LAD stent, I will go ahead and set him up for cardiac catheterization.  Risks and benefits of the procedure including stroke heart attack death renal impairment bleeding have been discussed.  Last creatinine was 0.87 in mid July, we should repeat labs.  -Continue with aspirin.  He has nitroglycerin as needed.  He has not been able to tolerate statin therapy in the past.  I would like for him to visit with the  lipid clinic to discuss further options in the future.  -If catheterization is unremarkable, symptoms could be GERD related.  CAD - Proximal LAD stent 1990 4.0 by 15 as above.  Prostate cancer -Status post chemotherapy currently in remission.  Doing well.  Medication Adjustments/Labs and Tests Ordered: Current medicines are reviewed at length with the patient today.  Concerns regarding medicines are outlined above.  Orders Placed This Encounter  Procedures  . CBC  . Basic metabolic panel  .  AMB Referral to [facility]  . EKG 12-Lead   Meds ordered this encounter  Medications  . nitroGLYCERIN (NITROSTAT) 0.4 MG SL tablet    Sig: Place 1 tablet (0.4 mg total) under the tongue every 5 (five) minutes as needed for chest pain.    Dispense:  25 tablet    Refill:  3    Patient Instructions  Medication Instructions:  The current medical regimen is effective;  continue present plan and medications.  Labwork: Please return for blood work prior to your cardiac cath as scheduled. (BMP,CBC)  Testing/Procedures: Your physician has requested that you have a cardiac catheterization. Cardiac catheterization is used to diagnose and/or treat various heart conditions. Doctors may recommend this procedure for a number of different reasons. The most common reason is to evaluate chest pain. Chest pain can be a symptom of coronary artery disease (CAD), and cardiac catheterization can show whether plaque is narrowing or blocking your heart's arteries. This procedure is also used to evaluate the valves, as well as measure the blood flow and oxygen levels in different parts of your heart. For further information please visit [email or web address]. Please follow instruction sheet, as given.  Follow-Up: Follow up with Dr Marlou Porch will be determine after your procedure.  Any Other Special Instructions Will Be Listed Below (If Applicable). You are being referred to the [facility] and will  be contacted to schedule this appointment.  Thank you for choosing [facility]!!       [facility] OFFICE [place], [place] [address] Dept: [phone] Loc: [phone]  Jeff Decker  09/24/2017  You are scheduled for a Cardiac Cath on Tuesday October 01, 2017 with Dr. Peter Martinique.  1. Please arrive at the [facility] (Main Entrance A) at [facility]: [address] [place], [place] [address] at 5:30 am  (This time is two hours before your procedure to ensure your preparation). Free valet parking service is available.   Special note: Every effort is made to have your procedure done on time. Please understand that emergencies sometimes delay scheduled procedures.  2. Diet: Do not eat solid foods after midnight.  The patient may have clear liquids until 5am upon the day of the procedure.  3. Labs: please return for lab work prior to your cardiac cath.  4. Medication instructions in preparation for your procedure: On the morning of your procedure, take your Asprin and any other morning medicines you normal take.  You may use sips of water.  5. Plan for one night stay--bring personal belongings. 6. Bring a current list of your medications and current insurance cards. 7. You MUST have a responsible person to drive you home. 8. Someone MUST be with you the first 24 hours after you arrive home or your discharge will be delayed. 9. Please wear clothes that are easy to get on and off and wear slip-on shoes.  Thank you for allowing [place] to care for you!   -- [facility] Invasive Cardiovascular services     Signed, Candee Furbish, MD  09/24/2017 5:55 PM    [place]

## 2017-09-24 NOTE — H&P (View-Only) (Signed)
Cardiology Office Note:    Date:  09/24/2017   ID:  Jeff Decker, DOB Jun 20, 1945, MRN [id number]  PCP:  Jeff Major, MD  Cardiologist:  No primary care provider on file.   Referring MD: Jeff Decker*    History of Present Illness:    Jeff Decker is a 72 y.o. male with hyperlipidemia, 10-year ASCVD risk 23% with prior myalgias on statin therapy taking omega-3, GERD, anemia, prostate cancer here for the evaluation of shortness of breath and chest pain.  Prior hemoglobin 12.9.,  LDL 156, triglycerides 168.  Had CP 3 x in past month. ?if consipation or gas. When lay down, hurting, TUMS, pain did not go away. NTG relieved pain. Had prior prox LAD CAD/Stent. Dr. Glade Lloyd, early stent 1998.  Prior stent squeeze and and balloon under arm.   Cath 02/25/96 - Prox LAD, 4.0 x 15, AVE stent.  Dyspnea with stairs x 1 month. Felt this prior to last episode.   Past Medical History:  Diagnosis Date  . Allergy   . Arthritis   . Cancer ([place]) 03/2016   prostate and bone , chemo completed  . GERD (gastroesophageal reflux disease)   . Myocardial infarction ([place]) 1989    Past Surgical History:  Procedure Laterality Date  . COLONOSCOPY  2006  . CORONARY ANGIOPLASTY WITH STENT PLACEMENT    . HEMORRHOID SURGERY    . ORIF SHOULDER FRACTURE     72 years old  . PROSTATE BIOPSY  03/2016  . TONSILLECTOMY AND ADENOIDECTOMY     72 years old    Current Medications: Current Meds  Medication Sig  . aspirin EC 81 MG tablet Take 81 mg by mouth daily.  . Calcium Carb-Cholecalciferol (CALCIUM 600+D) 600-800 MG-UNIT TABS Take by mouth.  . fexofenadine (ALLEGRA) 180 MG tablet Take 180 mg by mouth daily.  Marland Kitchen glucosamine-chondroitin 500-400 MG tablet Take 1 tablet by mouth 3 (three) times daily.  . Multiple Vitamin (MULTI-VITAMINS) TABS Take by mouth.  . Omega-3 Fatty Acids (FISH OIL) 1200 MG CAPS Take 1 capsule by mouth daily.  Marland Kitchen omeprazole (PRILOSEC) 20 MG capsule Take 20 mg by mouth  daily before breakfast.  . ranitidine (ZANTAC) 75 MG tablet Take 75 mg by mouth daily.   Current Facility-Administered Medications for the 09/24/17 encounter (Office Visit) with Jerline Pain, MD  Medication  . 0.9 %  sodium chloride infusion     Allergies:   Statins   Social History   Socioeconomic History  . Marital status: Widowed    Spouse name: Not on file  . Number of children: Not on file  . Years of education: Not on file  . Highest education level: Not on file  Occupational History  . Not on file  Social Needs  . Financial resource strain: Not on file  . Food insecurity:    Worry: Not on file    Inability: Not on file  . Transportation needs:    Medical: Not on file    Non-medical: Not on file  Tobacco Use  . Smoking status: Former Smoker    Last attempt to quit: 08/01/1986    Years since quitting: 31.1  . Smokeless tobacco: Never Used  Substance and Sexual Activity  . Alcohol use: Yes    Alcohol/week: 4.0 standard drinks    Types: 4 Cans of beer per week  . Drug use: Never  . Sexual activity: Not on file  Lifestyle  . Physical activity:    Days per  week: Not on file    Minutes per session: Not on file  . Stress: Not on file  Relationships  . Social connections:    Talks on phone: Not on file    Gets together: Not on file    Attends religious service: Not on file    Active member of club or organization: Not on file    Attends meetings of clubs or organizations: Not on file    Relationship status: Not on file  Other Topics Concern  . Not on file  Social History Narrative  . Not on file     Family History: The patient's family history includes Breast cancer in his mother; Colon cancer (age of onset: 32) in his father; Prostate cancer in his father. There is no history of Stomach cancer, Esophageal cancer, or Rectal cancer.  ROS:   Please see the history of present illness.     All other systems reviewed and are negative.  EKGs/Labs/Other Studies  Reviewed:    The following studies were reviewed today: Prior cardiac catheterization reviewed, lab work reviewed, normal creatinine, office note referral reviewed.  EKG:  EKG is  ordered today.  The ekg ordered today 09/24/2017 demonstrates sinus rhythm with no other significant abnormalities  Recent Labs: 08/15/2017: ALT 21; BUN 15; Creatinine 0.87; Hemoglobin 12.8; Platelet Count 251; Potassium 4.5; Sodium 139  Recent Lipid Panel No results found for: CHOL, TRIG, HDL, CHOLHDL, VLDL, LDLCALC, LDLDIRECT  Physical Exam:    VS:  BP (!) 142/76   Pulse 70   Ht 5\' 11"  (1.803 m)   Wt 191 lb 3.2 oz (86.7 kg)   BMI 26.67 kg/m     Wt Readings from Last 3 Encounters:  09/24/17 191 lb 3.2 oz (86.7 kg)  08/22/17 189 lb (85.7 kg)  07/12/17 186 lb (84.4 kg)     GEN:  Well nourished, well developed in no acute distress HEENT: Normal NECK: No JVD; No carotid bruits LYMPHATICS: No lymphadenopathy CARDIAC: RRR, no murmurs, rubs, gallops RESPIRATORY:  Clear to auscultation without rales, wheezing or rhonchi  ABDOMEN: Soft, non-tender, non-distended MUSCULOSKELETAL:  No edema; No deformity excellent radial pulses SKIN: Warm and dry NEUROLOGIC:  Alert and oriented x 3 PSYCHIATRIC:  Normal affect   ASSESSMENT:    1. Unstable angina ([place])   2. Pre-procedure lab exam   3. Hyperlipidemia, unspecified hyperlipidemia type    PLAN:    In order of problems listed above:  Unstable angina  -With his escalating symptoms that are somewhat similar to his previous feelings prior to his proximal LAD stent, I will go ahead and set him up for cardiac catheterization.  Risks and benefits of the procedure including stroke heart attack death renal impairment bleeding have been discussed.  Last creatinine was 0.87 in mid July, we should repeat labs.  -Continue with aspirin.  He has nitroglycerin as needed.  He has not been able to tolerate statin therapy in the past.  I would like for him to visit with the  lipid clinic to discuss further options in the future.  -If catheterization is unremarkable, symptoms could be GERD related.  CAD - Proximal LAD stent 1990 4.0 by 15 as above.  Prostate cancer -Status post chemotherapy currently in remission.  Doing well.  Medication Adjustments/Labs and Tests Ordered: Current medicines are reviewed at length with the patient today.  Concerns regarding medicines are outlined above.  Orders Placed This Encounter  Procedures  . CBC  . Basic metabolic panel  .  AMB Referral to [facility]  . EKG 12-Lead   Meds ordered this encounter  Medications  . nitroGLYCERIN (NITROSTAT) 0.4 MG SL tablet    Sig: Place 1 tablet (0.4 mg total) under the tongue every 5 (five) minutes as needed for chest pain.    Dispense:  25 tablet    Refill:  3    Patient Instructions  Medication Instructions:  The current medical regimen is effective;  continue present plan and medications.  Labwork: Please return for blood work prior to your cardiac cath as scheduled. (BMP,CBC)  Testing/Procedures: Your physician has requested that you have a cardiac catheterization. Cardiac catheterization is used to diagnose and/or treat various heart conditions. Doctors may recommend this procedure for a number of different reasons. The most common reason is to evaluate chest pain. Chest pain can be a symptom of coronary artery disease (CAD), and cardiac catheterization can show whether plaque is narrowing or blocking your heart's arteries. This procedure is also used to evaluate the valves, as well as measure the blood flow and oxygen levels in different parts of your heart. For further information please visit HugeFiesta.[email or web address]. Please follow instruction sheet, as given.  Follow-Up: Follow up with Dr Marlou Porch will be determine after your procedure.  Any Other Special Instructions Will Be Listed Below (If Applicable). You are being referred to the [facility] and will  be contacted to schedule this appointment.  Thank you for choosing [facility]!!       [facility] OFFICE [place], [place] [address] Dept: [phone] Loc: [phone]  BRODIE CORRELL  09/24/2017  You are scheduled for a Cardiac Cath on Tuesday October 01, 2017 with Dr. Peter Martinique.  1. Please arrive at the [facility] (Main Entrance A) at [facility]: [address] [place], [place] [address] at 5:30 am  (This time is two hours before your procedure to ensure your preparation). Free valet parking service is available.   Special note: Every effort is made to have your procedure done on time. Please understand that emergencies sometimes delay scheduled procedures.  2. Diet: Do not eat solid foods after midnight.  The patient may have clear liquids until 5am upon the day of the procedure.  3. Labs: please return for lab work prior to your cardiac cath.  4. Medication instructions in preparation for your procedure: On the morning of your procedure, take your Asprin and any other morning medicines you normal take.  You may use sips of water.  5. Plan for one night stay--bring personal belongings. 6. Bring a current list of your medications and current insurance cards. 7. You MUST have a responsible person to drive you home. 8. Someone MUST be with you the first 24 hours after you arrive home or your discharge will be delayed. 9. Please wear clothes that are easy to get on and off and wear slip-on shoes.  Thank you for allowing [place] to care for you!   -- [facility] Invasive Cardiovascular services     Signed, Candee Furbish, MD  09/24/2017 5:55 PM    [place]

## 2017-09-24 NOTE — Patient Instructions (Addendum)
Medication Instructions:  The current medical regimen is effective;  continue present plan and medications.  Labwork: Please return for blood work prior to your cardiac cath as scheduled. (BMP,CBC)  Testing/Procedures: Your physician has requested that you have a cardiac catheterization. Cardiac catheterization is used to diagnose and/or treat various heart conditions. Doctors may recommend this procedure for a number of different reasons. The most common reason is to evaluate chest pain. Chest pain can be a symptom of coronary artery disease (CAD), and cardiac catheterization can show whether plaque is narrowing or blocking your heart's arteries. This procedure is also used to evaluate the valves, as well as measure the blood flow and oxygen levels in different parts of your heart. For further information please visit HugeFiesta.tn. Please follow instruction sheet, as given.  Follow-Up: Follow up with Dr Marlou Porch will be determine after your procedure.  Any Other Special Instructions Will Be Listed Below (If Applicable). You are being referred to the [facility] and will be contacted to schedule this appointment.  Thank you for choosing [facility]!!       [facility] OFFICE [place], [place] [address] Dept: [phone] Loc: [phone]  Jeff Decker  09/24/2017  You are scheduled for a Cardiac Cath on Tuesday October 01, 2017 with Dr. Peter Martinique.  1. Please arrive at the [facility] (Main Entrance A) at [facility]: [address] [place], [place] [address] at 5:30 am  (This time is two hours before your procedure to ensure your preparation). Free valet parking service is available.   Special note: Every effort is made to have your procedure done on time. Please understand that emergencies sometimes delay scheduled procedures.  2. Diet: Do not eat solid  foods after midnight.  The patient may have clear liquids until 5am upon the day of the procedure.  3. Labs: please return for lab work prior to your cardiac cath.  4. Medication instructions in preparation for your procedure: On the morning of your procedure, take your Asprin and any other morning medicines you normal take.  You may use sips of water.  5. Plan for one night stay--bring personal belongings. 6. Bring a current list of your medications and current insurance cards. 7. You MUST have a responsible person to drive you home. 8. Someone MUST be with you the first 24 hours after you arrive home or your discharge will be delayed. 9. Please wear clothes that are easy to get on and off and wear slip-on shoes.  Thank you for allowing [place] to care for you!   -- [facility] Invasive Cardiovascular services

## 2017-09-25 ENCOUNTER — Other Ambulatory Visit: Payer: Medicare Other | Admitting: *Deleted

## 2017-09-25 DIAGNOSIS — I200 Unstable angina: Secondary | ICD-10-CM

## 2017-09-25 DIAGNOSIS — Z01812 Encounter for preprocedural laboratory examination: Secondary | ICD-10-CM

## 2017-09-25 LAB — CBC
Hematocrit: 36.2 % — ABNORMAL LOW (ref 37.5–51.0)
Hemoglobin: 12.6 g/dL — ABNORMAL LOW (ref 13.0–17.7)
MCH: 29.5 pg (ref 26.6–33.0)
MCHC: 34.8 g/dL (ref 31.5–35.7)
MCV: 85 fL (ref 79–97)
Platelets: 282 10*3/uL (ref 150–450)
RBC: 4.27 x10E6/uL (ref 4.14–5.80)
RDW: 13.8 % (ref 12.3–15.4)
WBC: 5.9 10*3/uL (ref 3.4–10.8)

## 2017-09-25 LAB — BASIC METABOLIC PANEL
BUN/Creatinine Ratio: 16 (ref 10–24)
BUN: 14 mg/dL (ref 8–27)
CO2: 25 mmol/L (ref 20–29)
Calcium: 9.4 mg/dL (ref 8.6–10.2)
Chloride: 100 mmol/L (ref 96–106)
Creatinine, Ser: 0.9 mg/dL (ref 0.76–1.27)
GFR calc Af Amer: 98 mL/min/{1.73_m2} (ref >59)
GFR calc non Af Amer: 85 mL/min/{1.73_m2} (ref >59)
Glucose: 98 mg/dL (ref 65–99)
Potassium: 4.3 mmol/L (ref 3.5–5.2)
Sodium: 139 mmol/L (ref 134–144)

## 2017-09-26 NOTE — Progress Notes (Signed)
Pt has been made aware of normal result and verbalized understanding.  jw 09/26/17

## 2017-10-01 ENCOUNTER — Encounter (HOSPITAL_COMMUNITY): Payer: Self-pay | Admitting: Cardiology

## 2017-10-01 ENCOUNTER — Other Ambulatory Visit: Payer: Self-pay

## 2017-10-01 ENCOUNTER — Inpatient Hospital Stay (HOSPITAL_COMMUNITY)
Admission: AD | Admit: 2017-10-01 | Discharge: 2017-10-07 | DRG: 234 | Disposition: A | Discharge status: Home / Self Care | Payer: Medicare Other | Attending: Surgery | Admitting: Surgery

## 2017-10-01 ENCOUNTER — Inpatient Hospital Stay (HOSPITAL_COMMUNITY)
Admission: AD | Disposition: A | Discharge status: Home / Self Care | Payer: Self-pay | Source: Home / Self Care | Attending: Surgery

## 2017-10-01 ENCOUNTER — Other Ambulatory Visit: Payer: Self-pay | Admitting: *Deleted

## 2017-10-01 DIAGNOSIS — E782 Mixed hyperlipidemia: Secondary | ICD-10-CM | POA: Diagnosis present

## 2017-10-01 DIAGNOSIS — Z87891 Personal history of nicotine dependence: Secondary | ICD-10-CM | POA: Diagnosis not present

## 2017-10-01 DIAGNOSIS — Z9221 Personal history of antineoplastic chemotherapy: Secondary | ICD-10-CM | POA: Diagnosis not present

## 2017-10-01 DIAGNOSIS — I25110 Atherosclerotic heart disease of native coronary artery with unstable angina pectoris: Secondary | ICD-10-CM | POA: Diagnosis present

## 2017-10-01 DIAGNOSIS — D696 Thrombocytopenia, unspecified: Secondary | ICD-10-CM | POA: Diagnosis present

## 2017-10-01 DIAGNOSIS — T82855A Stenosis of coronary artery stent, initial encounter: Secondary | ICD-10-CM | POA: Diagnosis present

## 2017-10-01 DIAGNOSIS — M1990 Unspecified osteoarthritis, unspecified site: Secondary | ICD-10-CM | POA: Diagnosis present

## 2017-10-01 DIAGNOSIS — D62 Acute posthemorrhagic anemia: Secondary | ICD-10-CM | POA: Diagnosis not present

## 2017-10-01 DIAGNOSIS — Z800 Family history of malignant neoplasm of digestive organs: Secondary | ICD-10-CM | POA: Diagnosis not present

## 2017-10-01 DIAGNOSIS — Z7982 Long term (current) use of aspirin: Secondary | ICD-10-CM | POA: Diagnosis not present

## 2017-10-01 DIAGNOSIS — Z951 Presence of aortocoronary bypass graft: Secondary | ICD-10-CM

## 2017-10-01 DIAGNOSIS — Z85830 Personal history of malignant neoplasm of bone: Secondary | ICD-10-CM | POA: Diagnosis not present

## 2017-10-01 DIAGNOSIS — E872 Acidosis: Secondary | ICD-10-CM | POA: Diagnosis not present

## 2017-10-01 DIAGNOSIS — Z8042 Family history of malignant neoplasm of prostate: Secondary | ICD-10-CM

## 2017-10-01 DIAGNOSIS — Z8546 Personal history of malignant neoplasm of prostate: Secondary | ICD-10-CM

## 2017-10-01 DIAGNOSIS — Z79899 Other long term (current) drug therapy: Secondary | ICD-10-CM

## 2017-10-01 DIAGNOSIS — I200 Unstable angina: Secondary | ICD-10-CM | POA: Diagnosis not present

## 2017-10-01 DIAGNOSIS — I252 Old myocardial infarction: Secondary | ICD-10-CM | POA: Diagnosis not present

## 2017-10-01 DIAGNOSIS — I255 Ischemic cardiomyopathy: Secondary | ICD-10-CM | POA: Diagnosis present

## 2017-10-01 DIAGNOSIS — Z803 Family history of malignant neoplasm of breast: Secondary | ICD-10-CM | POA: Diagnosis not present

## 2017-10-01 DIAGNOSIS — E8770 Fluid overload, unspecified: Secondary | ICD-10-CM | POA: Diagnosis not present

## 2017-10-01 DIAGNOSIS — I5030 Unspecified diastolic (congestive) heart failure: Secondary | ICD-10-CM | POA: Diagnosis not present

## 2017-10-01 DIAGNOSIS — K219 Gastro-esophageal reflux disease without esophagitis: Secondary | ICD-10-CM | POA: Diagnosis present

## 2017-10-01 DIAGNOSIS — I319 Disease of pericardium, unspecified: Secondary | ICD-10-CM | POA: Diagnosis present

## 2017-10-01 DIAGNOSIS — Y831 Surgical operation with implant of artificial internal device as the cause of abnormal reaction of the patient, or of later complication, without mention of misadventure at the time of the procedure: Secondary | ICD-10-CM | POA: Diagnosis present

## 2017-10-01 DIAGNOSIS — Z888 Allergy status to other drugs, medicaments and biological substances status: Secondary | ICD-10-CM | POA: Diagnosis not present

## 2017-10-01 DIAGNOSIS — R7303 Prediabetes: Secondary | ICD-10-CM | POA: Diagnosis present

## 2017-10-01 DIAGNOSIS — I2510 Atherosclerotic heart disease of native coronary artery without angina pectoris: Secondary | ICD-10-CM

## 2017-10-01 DIAGNOSIS — Z01810 Encounter for preprocedural cardiovascular examination: Secondary | ICD-10-CM | POA: Diagnosis not present

## 2017-10-01 DIAGNOSIS — Z01818 Encounter for other preprocedural examination: Secondary | ICD-10-CM

## 2017-10-01 HISTORY — DX: Atherosclerotic heart disease of native coronary artery without angina pectoris: I25.10

## 2017-10-01 HISTORY — PX: LEFT HEART CATH AND CORONARY ANGIOGRAPHY: CATH118249

## 2017-10-01 LAB — HEPARIN LEVEL (UNFRACTIONATED): Heparin Unfractionated: 0.12 IU/mL — ABNORMAL LOW (ref 0.30–0.70)

## 2017-10-01 SURGERY — LEFT HEART CATH AND CORONARY ANGIOGRAPHY
Anesthesia: LOCAL

## 2017-10-01 MED ORDER — SODIUM CHLORIDE 0.9 % WEIGHT BASED INFUSION
3.0000 mL/kg/h | INTRAVENOUS | Status: DC
  Administered 2017-10-01: 3 mL/kg/h via INTRAVENOUS

## 2017-10-01 MED ORDER — HEPARIN (PORCINE) IN NACL 1000-0.9 UT/500ML-% IV SOLN
INTRAVENOUS | Status: DC | PRN
  Administered 2017-10-01 (×2): 500 mL

## 2017-10-01 MED ORDER — SODIUM CHLORIDE 0.9 % WEIGHT BASED INFUSION: 1.0000 mL/kg/h | INTRAVENOUS | Status: DC

## 2017-10-01 MED ORDER — ACETAMINOPHEN 325 MG PO TABS: 650.0000 mg | ORAL_TABLET | ORAL | Status: DC | PRN

## 2017-10-01 MED ORDER — PANTOPRAZOLE SODIUM 40 MG PO TBEC
40.0000 mg | DELAYED_RELEASE_TABLET | Freq: Every day | ORAL | Status: DC
  Administered 2017-10-02: 40 mg via ORAL
  Filled 2017-10-01: qty 1

## 2017-10-01 MED ORDER — LORATADINE 10 MG PO TABS
10.0000 mg | ORAL_TABLET | Freq: Every day | ORAL | Status: DC
  Administered 2017-10-02 – 2017-10-07 (×5): 10 mg via ORAL
  Filled 2017-10-01 (×5): qty 1

## 2017-10-01 MED ORDER — SODIUM CHLORIDE 0.9 % WEIGHT BASED INFUSION
1.0000 mL/kg/h | INTRAVENOUS | Status: AC
  Administered 2017-10-01: 1 mL/kg/h via INTRAVENOUS

## 2017-10-01 MED ORDER — IOHEXOL 350 MG/ML SOLN
INTRAVENOUS | Status: DC | PRN
  Administered 2017-10-01: 90 mL via INTRA_ARTERIAL

## 2017-10-01 MED ORDER — VERAPAMIL HCL 2.5 MG/ML IV SOLN
INTRAVENOUS | Status: AC
  Filled 2017-10-01: qty 2

## 2017-10-01 MED ORDER — NITROGLYCERIN 0.4 MG SL SUBL: 0.4000 mg | SUBLINGUAL_TABLET | SUBLINGUAL | Status: DC | PRN

## 2017-10-01 MED ORDER — MIDAZOLAM HCL 2 MG/2ML IJ SOLN
INTRAMUSCULAR | Status: AC
  Filled 2017-10-01: qty 2

## 2017-10-01 MED ORDER — LIDOCAINE HCL (PF) 1 % IJ SOLN
INTRAMUSCULAR | Status: AC
  Filled 2017-10-01: qty 30

## 2017-10-01 MED ORDER — SODIUM CHLORIDE 0.9% FLUSH: 3.0000 mL | INTRAVENOUS | Status: DC | PRN

## 2017-10-01 MED ORDER — CALCIUM CARBONATE-VITAMIN D 500-200 MG-UNIT PO TABS
1.0000 | ORAL_TABLET | Freq: Every day | ORAL | Status: DC
  Administered 2017-10-02 – 2017-10-07 (×4): 1 via ORAL
  Filled 2017-10-01 (×5): qty 1

## 2017-10-01 MED ORDER — ONDANSETRON HCL 4 MG/2ML IJ SOLN: 4.0000 mg | Freq: Four times a day (QID) | INTRAMUSCULAR | Status: DC | PRN

## 2017-10-01 MED ORDER — ASPIRIN 81 MG PO CHEW: 81.0000 mg | CHEWABLE_TABLET | ORAL | Status: DC

## 2017-10-01 MED ORDER — FENTANYL CITRATE (PF) 100 MCG/2ML IJ SOLN
INTRAMUSCULAR | Status: DC | PRN
  Administered 2017-10-01: 25 ug via INTRAVENOUS

## 2017-10-01 MED ORDER — SODIUM CHLORIDE 0.9% FLUSH: 3.0000 mL | Freq: Two times a day (BID) | INTRAVENOUS | Status: DC

## 2017-10-01 MED ORDER — HEPARIN (PORCINE) IN NACL 100-0.45 UNIT/ML-% IJ SOLN
1450.0000 [IU]/h | INTRAMUSCULAR | Status: DC
  Administered 2017-10-01: 1100 [IU]/h via INTRAVENOUS
  Administered 2017-10-02 – 2017-10-03 (×2): 1450 [IU]/h via INTRAVENOUS
  Filled 2017-10-01 (×3): qty 250

## 2017-10-01 MED ORDER — HEPARIN SODIUM (PORCINE) 1000 UNIT/ML IJ SOLN
INTRAMUSCULAR | Status: AC
  Filled 2017-10-01: qty 1

## 2017-10-01 MED ORDER — MIDAZOLAM HCL 2 MG/2ML IJ SOLN
INTRAMUSCULAR | Status: DC | PRN
  Administered 2017-10-01: 1 mg via INTRAVENOUS

## 2017-10-01 MED ORDER — SODIUM CHLORIDE 0.9 % IV SOLN: 250.0000 mL | INTRAVENOUS | Status: DC | PRN

## 2017-10-01 MED ORDER — ADULT MULTIVITAMIN W/MINERALS CH
1.0000 | ORAL_TABLET | Freq: Every day | ORAL | Status: DC
  Administered 2017-10-01 – 2017-10-07 (×5): 1 via ORAL
  Filled 2017-10-01 (×6): qty 1

## 2017-10-01 MED ORDER — HEPARIN (PORCINE) IN NACL 1000-0.9 UT/500ML-% IV SOLN
INTRAVENOUS | Status: AC
  Filled 2017-10-01: qty 1000

## 2017-10-01 MED ORDER — ASPIRIN EC 81 MG PO TBEC
81.0000 mg | DELAYED_RELEASE_TABLET | Freq: Every day | ORAL | Status: DC
  Administered 2017-10-02: 81 mg via ORAL
  Filled 2017-10-01: qty 1

## 2017-10-01 MED ORDER — HEPARIN SODIUM (PORCINE) 1000 UNIT/ML IJ SOLN
INTRAMUSCULAR | Status: DC | PRN
  Administered 2017-10-01: 4500 [IU] via INTRAVENOUS

## 2017-10-01 MED ORDER — SODIUM CHLORIDE 0.9% FLUSH
3.0000 mL | Freq: Two times a day (BID) | INTRAVENOUS | Status: DC
  Administered 2017-10-01 – 2017-10-02 (×2): 3 mL via INTRAVENOUS

## 2017-10-01 MED ORDER — FENTANYL CITRATE (PF) 100 MCG/2ML IJ SOLN
INTRAMUSCULAR | Status: AC
  Filled 2017-10-01: qty 2

## 2017-10-01 MED ORDER — VERAPAMIL HCL 2.5 MG/ML IV SOLN
INTRAVENOUS | Status: DC | PRN
  Administered 2017-10-01: 10 mL via INTRA_ARTERIAL

## 2017-10-01 SURGICAL SUPPLY — 10 items
CATH IMPULSE 5F ANG/FL3.5 (CATHETERS) ×1 IMPLANT
DEVICE RAD COMP TR BAND LRG (VASCULAR PRODUCTS) ×1 IMPLANT
GLIDESHEATH SLEND SS 6F .021 (SHEATH) ×1 IMPLANT
GUIDEWIRE INQWIRE 1.5J.035X260 (WIRE) IMPLANT
INQWIRE 1.5J .035X260CM (WIRE) ×2
KIT HEART LEFT (KITS) ×2 IMPLANT
PACK CARDIAC CATHETERIZATION (CUSTOM PROCEDURE TRAY) ×2 IMPLANT
SYR MEDRAD MARK V 150ML (SYRINGE) ×2 IMPLANT
TRANSDUCER W/STOPCOCK (MISCELLANEOUS) ×2 IMPLANT
TUBING CIL FLEX 10 FLL-RA (TUBING) ×2 IMPLANT

## 2017-10-01 NOTE — Interval H&P Note (Signed)
History and Physical Interval Note:  10/01/2017 7:21 AM  Jeff Decker  has presented today for surgery, with the diagnosis of ua  The various methods of treatment have been discussed with the patient and family. After consideration of risks, benefits and other options for treatment, the patient has consented to  Procedure(s): LEFT HEART CATH AND CORONARY ANGIOGRAPHY (N/A) as a surgical intervention .  The patient's history has been reviewed, patient examined, no change in status, stable for surgery.  I have reviewed the patient's chart and labs.  Questions were answered to the patient's satisfaction.    Cath Lab Visit (complete for each Cath Lab visit)  Clinical Evaluation Leading to the Procedure:   ACS: Yes.    Non-ACS:    Anginal Classification: CCS III  Anti-ischemic medical therapy: No Therapy  Non-Invasive Test Results: No non-invasive testing performed  Prior CABG: No previous CABG       Tiberius Loftus Martinique MD,FACC 10/01/2017 7:22 AM

## 2017-10-01 NOTE — Progress Notes (Signed)
ANTICOAGULATION CONSULT NOTE - Initial Consult  Pharmacy Consult for heparin Indication: chest pain/ACS  Allergies  Allergen Reactions  . Statins Other (See Comments)    Muscle pain Tried 3 different statins    Patient Measurements: Height: 5\' 11"  (180.3 cm) Weight: 188 lb (85.3 kg) IBW/kg (Calculated) : 75.3 HEPARIN DW (KG): 85.3  Vital Signs: Temp: 97.3 F (36.3 C) (09/03 0548) Temp Source: Temporal (09/03 0548) BP: 144/83 (09/03 0756) Pulse Rate: 69 (09/03 0756)  Labs: No results for input(s): HGB, HCT, PLT, APTT, LABPROT, INR, HEPARINUNFRC, HEPRLOWMOCWT, CREATININE, CKTOTAL, CKMB, TROPONINI in the last 72 hours.  Estimated Creatinine Clearance: 79 mL/min (by C-G formula based on SCr of 0.9 mg/dL).   Medical History: Past Medical History:  Diagnosis Date  . Allergy   . Arthritis   . Cancer ([place]) 03/2016   prostate and bone , chemo completed  . GERD (gastroesophageal reflux disease)   . Myocardial infarction [facility]) 1989    Assessment: Jeff Decker is a 72 y.o. Male [facility] for unstable angina. No anticoagulation PTA. Pharmacy consulted for heparin infusion management to start 8 hours after sheath removal. Will not bolus d/t risk of bleeding.  Goal of Therapy:  Heparin level 0.3-0.7 units/ml Monitor platelets by anticoagulation protocol: Yes   Plan:  Start heparin 1100 units/hr 9/3 @ 1600 Will check heparin level 6 hours after heparin initiation Monitor daily heparin level, CBC, s/sx of bleeding  Thank you for involving pharmacy in this patient's care.  Janae Bridgeman, PharmD PGY1 Pharmacy Resident Phone: [phone] 10/01/2017 8:44 AM

## 2017-10-01 NOTE — Consult Note (Signed)
[address]       [place] [address]             [phone]      Cardiothoracic Surgery Consultation  Reason for Consult: Left main and severe multivessel coronary artery disease Referring Physician: Dr. Peter Martinique  Kipling Graser is an 72 y.o. male.  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  The patient reports being in his usual state of health until the past 3 weeks when he began having episodes of substernal chest discomfort and shortness of breath with exertion.  His last episode was yesterday.  He reports a normal energy level and said that a few weeks ago he completed a 5600 mile motorcycle trip to [place].  Cardiac catheterization today showed a 50% distal left main stenosis extending into the ostium of the LAD with significant calcification.  The ostial to proximal LAD had 90% stenosis.  The right coronary artery had 60% mid vessel stenosis and 99% stenosis at the ostium of the PDA branch.  The left circumflex was a large dominant vessel with no significant stenoses but compromised by the distal left main stenosis.  Left ventricular end-diastolic pressure is normal.  Left ventricular ejection fraction was reduced to 35 to 45%.  Past Medical History:  Diagnosis Date  . Allergy   . Arthritis   . Cancer ([facility]) 03/2016   prostate and bone , chemo completed  . Coronary artery disease   . GERD (gastroesophageal reflux disease)   . Myocardial infarction ([facility] [place]) 1989    Past Surgical History:  Procedure Laterality Date  . COLONOSCOPY  2006  . CORONARY ANGIOPLASTY WITH STENT PLACEMENT    . HEMORRHOID SURGERY    . LEFT HEART CATH AND CORONARY ANGIOGRAPHY N/A 10/01/2017   Procedure: LEFT HEART CATH AND CORONARY ANGIOGRAPHY;  Surgeon: Martinique, Peter M, MD;  Location: [facility];  Service: Cardiovascular;  Laterality: N/A;  . ORIF SHOULDER FRACTURE     72 years old  . PROSTATE BIOPSY  03/2016  . TONSILLECTOMY AND ADENOIDECTOMY     72 years old    Family History  Problem Relation Age of Onset  . Breast cancer Mother   . Colon cancer Father 41  . Prostate cancer Father   . Stomach cancer Neg Hx   . Esophageal cancer Neg Hx   . Rectal cancer Neg Hx     Social History:  reports that he quit smoking about 31 years ago. He has never used smokeless tobacco. He reports that he drinks about 4.0 standard drinks of alcohol per week. He reports that he does not use drugs.  Allergies:  Allergies  Allergen Reactions  . Statins Other (See Comments)    Muscle pain Tried 3 different statins    Medications:  I have reviewed the patient's current medications. Prior to Admission:  Facility-Administered Medications Prior to Admission  Medication Dose Route Frequency Provider Last Rate Last Dose  . 0.9 %  sodium chloride infusion  500 mL Intravenous Once Doran Stabler, MD       Medications Prior to Admission  Medication Sig Dispense Refill Last Dose  . aspirin EC 81 MG tablet Take 81 mg by mouth daily.   10/01/2017 at 0500  . Calcium Carb-Cholecalciferol (CALCIUM 600+D) 600-800  MG-UNIT TABS Take 1 tablet by mouth daily.    09/30/2017 at Unknown time  . fexofenadine (ALLEGRA) 180 MG tablet Take 180 mg by mouth daily.   10/01/2017 at Unknown time  . glucosamine-chondroitin 500-400 MG tablet Take 1 tablet by mouth daily.    09/30/2017 at Unknown time  . Multiple Vitamin (MULTI-VITAMINS) TABS Take by mouth.   09/30/2017 at Unknown time  . nitroGLYCERIN (NITROSTAT) 0.4 MG SL tablet Place 1 tablet (0.4 mg total) under the tongue every 5 (five) minutes as needed for chest pain. 25 tablet 3 09/30/2017 at 2130  . Omega-3 Fatty Acids (FISH OIL) 1200 MG CAPS Take 1,200 capsules by mouth daily.    09/30/2017 at Unknown time  . omeprazole (PRILOSEC) 20 MG capsule Take 20 mg by mouth  daily before breakfast.  1 10/01/2017 at Unknown time   Scheduled: . [START ON 10/02/2017] aspirin EC  81 mg Oral Daily  . calcium-vitamin D  1 tablet Oral Daily  . [START ON 10/02/2017] loratadine  10 mg Oral Daily  . [START ON 10/02/2017] pantoprazole  40 mg Oral Daily  . sodium chloride flush  3 mL Intravenous Q12H   Continuous: . sodium chloride    . heparin     CBJ:SEGBTD chloride, acetaminophen, nitroGLYCERIN, ondansetron (ZOFRAN) IV, sodium chloride flush Anti-infectives (From admission, onward)   None      No results found for this or any previous visit (from the past 48 hour(s)).  No results found.  Review of Systems  Constitutional: Negative for chills, fever and malaise/fatigue.  HENT: Negative.   Eyes: Negative.   Respiratory: Positive for shortness of breath.   Cardiovascular: Positive for chest pain. Negative for orthopnea, leg swelling and PND.  Gastrointestinal: Negative.   Genitourinary: Negative.   Musculoskeletal: Positive for joint pain.  Skin: Negative.   Neurological: Negative.   Endo/Heme/Allergies: Negative.   Psychiatric/Behavioral: Negative.    Blood pressure 138/65, pulse 62, temperature (!) 97.3 F (36.3 C), temperature source Temporal, resp. rate 12, height 5\' 11"  (1.803 m), weight 85.3 kg, SpO2 98 %. Physical Exam  Constitutional: He is oriented to person, place, and time. He appears well-developed and well-nourished. No distress.  HENT:  Head: Normocephalic and atraumatic.  Mouth/Throat: Oropharynx is clear and moist.  Eyes: Pupils are equal, round, and reactive to light. Conjunctivae and EOM are normal.  Neck: Normal range of motion. Neck supple. No JVD present. No thyromegaly present.  Cardiovascular: Normal rate, regular rhythm, normal heart sounds and intact distal pulses.  No murmur heard. Respiratory: Effort normal and breath sounds normal. No respiratory distress.  GI: Soft. Bowel sounds are normal. He exhibits no distension. There is no  tenderness.  Musculoskeletal: Normal range of motion. He exhibits no edema.  Lymphadenopathy:    He has no cervical adenopathy.  Neurological: He is alert and oriented to person, place, and time. He has normal strength. No cranial nerve deficit or sensory deficit.  Skin: Skin is warm and dry.  Psychiatric: He has a normal mood and affect.   [name]  CARDIAC CATHETERIZATION  Order# [id number]  Reading physician: Martinique, Peter M, MD Ordering physician: Martinique, Peter M, MD Study date: 10/01/17  Physicians   Panel Physicians Referring Physician Case Authorizing Physician  Martinique, Peter M, MD (Primary)    Procedures   LEFT HEART CATH AND CORONARY ANGIOGRAPHY  Conclusion     Prox LAD lesion is 75% stenosed.  Ost LAD to Prox LAD lesion is 90% stenosed.  Prox  LAD to Mid LAD lesion is 90% stenosed.  Dist LM to Ost LAD lesion is 50% stenosed.  Mid RCA lesion is 60% stenosed.  Ost RPDA to RPDA lesion is 99% stenosed.  There is moderate left ventricular systolic dysfunction.  LV end diastolic pressure is normal.  The left ventricular ejection fraction is 35-45% by visual estimate.   1. Severe 2 vessel and left main CAD.     - 50% distal left main    - 90% ostial and mid LAD, diffuse 75% in stent restenosis    - 99% PDA 2. Moderate LV dysfunction 3. Normal LVEDP  Plan: Recommend CT surgery consult for CABG.  Recommend Aspirin 81mg  daily for moderate CAD.  Indications   Unstable angina (HCC) [I20.0 (ICD-10-CM)]  Procedural Details/Technique   Technical Details Indication: 73 yo WM with remote stenting of the proximal LAD in 1998 presents with unstable angina  Procedural Details: The right wrist was prepped, draped, and anesthetized with 1% lidocaine. Using the modified Seldinger technique, a 6 French slender sheath was introduced into the right radial artery. 3 mg of verapamil was administered through the sheath, weight-based unfractionated heparin was  administered intravenously. Standard Judkins catheters were used for selective coronary angiography and left ventriculography. Catheter exchanges were performed over an exchange length guidewire. There were no immediate procedural complications. A TR band was used for radial hemostasis at the completion of the procedure. The patient was transferred to the post catheterization recovery area for further monitoring.  Contrast: 90 cc   Estimated blood loss <50 mL.  During this procedure the patient was administered the following to achieve and maintain moderate conscious sedation: Versed 1 mg, Fentanyl 25 mcg, while the patient's heart rate, blood pressure, and oxygen saturation were continuously monitored. The period of conscious sedation was 15 minutes, of which I was present face-to-face 100% of this time.  Complications   Complications documented before study signed (10/01/2017 8:08 AM EDT)    No complications were associated with this study.  Documented by Martinique, Peter M, MD - 10/01/2017 8:06 AM EDT    Coronary Findings   Diagnostic  Dominance: Right  Left Main  Dist LM to Ost LAD lesion 50% stenosed  Dist LM to Ost LAD lesion is 50% stenosed. The lesion is eccentric.  Left Anterior Descending  Ost LAD to Prox LAD lesion 90% stenosed  Ost LAD to Prox LAD lesion is 90% stenosed.  Prox LAD lesion 75% stenosed  Prox LAD lesion is 75% stenosed. The lesion was previously treated using a bare metal stent over 2 years ago.  Prox LAD to Mid LAD lesion 90% stenosed  Prox LAD to Mid LAD lesion is 90% stenosed.  Right Coronary Artery  Mid RCA lesion 60% stenosed  Mid RCA lesion is 60% stenosed.  Right Posterior Descending Artery  Ost RPDA to RPDA lesion 99% stenosed  Ost RPDA to RPDA lesion is 99% stenosed.  Inferior Septal  Collaterals  Inf Sept filled by collaterals from 3rd Sept.    Intervention   No interventions have been documented.  Wall Motion   Resting       The basilar  anterior and basilar inferior segments are normal. The mid anterior segment is hypokinetic. The mid inferior, apical anterior and apical inferior segments are akinetic.           Left Heart   Left Ventricle The left ventricular size is normal. There is moderate left ventricular systolic dysfunction. LV end diastolic pressure is normal.  The left ventricular ejection fraction is 35-45% by visual estimate. There are LV function abnormalities due to segmental dysfunction.  Coronary Diagrams   Diagnostic Diagram       Implants    No implant documentation for this case.  MERGE Images   Show images for CARDIAC CATHETERIZATION   Link to Procedure Log   Procedure Log    Hemo Data    Most Recent Value  AO Systolic Pressure 892 mmHg  AO Diastolic Pressure 69 mmHg  AO Mean 96 mmHg  LV Systolic Pressure 119 mmHg  LV Diastolic Pressure 10 mmHg  LV EDP 18 mmHg  AOp Systolic Pressure 417 mmHg  AOp Diastolic Pressure 67 mmHg  AOp Mean Pressure 98 mmHg  LVp Systolic Pressure 408 mmHg  LVp Diastolic Pressure 12 mmHg  LVp EDP Pressure 20 mmHg    Assessment/Plan:  This 72 year old gentleman has left main and severe multivessel coronary disease with accelerating anginal symptoms.  He has moderate left ventricular systolic dysfunction.  I agree that coronary bypass graft surgery is the best treatment to prevent further ischemia and infarction and improve his quality of life.  I reviewed the cardiac catheterization findings with the patient and his family and answered their questions.  I discussed the operative procedure with the patient and family including alternatives, benefits and risks; including but not limited to bleeding, blood transfusion, infection, stroke, myocardial infarction, graft failure, heart block requiring a permanent pacemaker, organ dysfunction, and death.  [name] understands and agrees to proceed.  We will schedule surgery for Thursday, 10/03/2017.   I spent  60 minutes performing this consultation and > 50% of this time was spent face to face counseling and coordinating the care of this patient's severe multivessel coronary disease.   Gaye Pollack 10/01/2017, 3:44 PM

## 2017-10-01 NOTE — Progress Notes (Signed)
[place] for heparin Indication: chest pain/ACS  Allergies  Allergen Reactions  . Statins Other (See Comments)    Muscle pain Tried 3 different statins    Patient Measurements: Height: 5\' 11"  (180.3 cm) Weight: 188 lb (85.3 kg) IBW/kg (Calculated) : 75.3 HEPARIN DW (KG): 85.3  Vital Signs: Temp: 97.9 F (36.6 C) (09/03 1954) Temp Source: Oral (09/03 1954) BP: 132/71 (09/03 1954) Pulse Rate: 63 (09/03 1954)  Labs: Recent Labs    10/01/17 2234  HEPARINUNFRC 0.12*    Estimated Creatinine Clearance: 79 mL/min (by C-G formula based on SCr of 0.9 mg/dL).  Assessment: 72 y.o. male with CAD awaiting CABG for heparin  Goal of Therapy:  Heparin level 0.3-0.7 units/ml Monitor platelets by anticoagulation protocol: Yes   Plan:  Increase Heparin 1350 units/hr Follow-up am labs.  Phillis Knack, PharmD, BCPS    10/01/2017 11:20 PM

## 2017-10-02 ENCOUNTER — Inpatient Hospital Stay (HOSPITAL_COMMUNITY): Payer: Medicare Other

## 2017-10-02 DIAGNOSIS — Z01810 Encounter for preprocedural cardiovascular examination: Secondary | ICD-10-CM

## 2017-10-02 DIAGNOSIS — I5030 Unspecified diastolic (congestive) heart failure: Secondary | ICD-10-CM

## 2017-10-02 DIAGNOSIS — I200 Unstable angina: Secondary | ICD-10-CM

## 2017-10-02 LAB — BLOOD GAS, ARTERIAL
Acid-base deficit: 1 mmol/L (ref 0.0–2.0)
Bicarbonate: 22.9 mmol/L (ref 20.0–28.0)
Drawn by: 519031
FIO2: 21
O2 Saturation: 97.9 %
Patient temperature: 98.6
pCO2 arterial: 36.5 mmHg (ref 32.0–48.0)
pH, Arterial: 7.414 (ref 7.350–7.450)
pO2, Arterial: 100 mmHg (ref 83.0–108.0)

## 2017-10-02 LAB — COMPREHENSIVE METABOLIC PANEL
ALT: 21 U/L (ref 0–44)
AST: 20 U/L (ref 15–41)
Albumin: 3.8 g/dL (ref 3.5–5.0)
Alkaline Phosphatase: 70 U/L (ref 38–126)
Anion gap: 11 (ref 5–15)
BUN: 13 mg/dL (ref 8–23)
CO2: 24 mmol/L (ref 22–32)
Calcium: 9.4 mg/dL (ref 8.9–10.3)
Chloride: 104 mmol/L (ref 98–111)
Creatinine, Ser: 0.8 mg/dL (ref 0.61–1.24)
GFR calc Af Amer: >60 mL/min (ref >60)
GFR calc non Af Amer: >60 mL/min (ref >60)
Glucose, Bld: 119 mg/dL — ABNORMAL HIGH (ref 70–99)
Potassium: 3.5 mmol/L (ref 3.5–5.1)
Sodium: 139 mmol/L (ref 135–145)
Total Bilirubin: 0.8 mg/dL (ref 0.3–1.2)
Total Protein: 6.8 g/dL (ref 6.5–8.1)

## 2017-10-02 LAB — BASIC METABOLIC PANEL
Anion gap: 9 (ref 5–15)
BUN: 10 mg/dL (ref 8–23)
CO2: 25 mmol/L (ref 22–32)
Calcium: 9.1 mg/dL (ref 8.9–10.3)
Chloride: 105 mmol/L (ref 98–111)
Creatinine, Ser: 0.74 mg/dL (ref 0.61–1.24)
GFR calc Af Amer: >60 mL/min (ref >60)
GFR calc non Af Amer: >60 mL/min (ref >60)
Glucose, Bld: 118 mg/dL — ABNORMAL HIGH (ref 70–99)
Potassium: 3.7 mmol/L (ref 3.5–5.1)
Sodium: 139 mmol/L (ref 135–145)

## 2017-10-02 LAB — CBC
HCT: 37.7 % — ABNORMAL LOW (ref 39.0–52.0)
Hemoglobin: 12.6 g/dL — ABNORMAL LOW (ref 13.0–17.0)
MCH: 29.4 pg (ref 26.0–34.0)
MCHC: 33.4 g/dL (ref 30.0–36.0)
MCV: 88.1 fL (ref 78.0–100.0)
Platelets: 228 10*3/uL (ref 150–400)
RBC: 4.28 MIL/uL (ref 4.22–5.81)
RDW: 12.4 % (ref 11.5–15.5)
WBC: 6.4 10*3/uL (ref 4.0–10.5)

## 2017-10-02 LAB — PULMONARY FUNCTION TEST
FEF 25-75 Pre: 3.97 L/sec
FEF2575-%Pred-Pre: 163 %
FEV1-%Pred-Pre: 103 %
FEV1-Pre: 3.38 L
FEV1FVC-%Pred-Pre: 120 %
FEV6-%Pred-Pre: 91 %
FEV6-Pre: 3.86 L
FEV6FVC-%Pred-Pre: 106 %
FVC-%Pred-Pre: 85 %
FVC-Pre: 3.86 L
Pre FEV1/FVC ratio: 88 %
Pre FEV6/FVC Ratio: 100 %

## 2017-10-02 LAB — URINALYSIS, ROUTINE W REFLEX MICROSCOPIC
Bilirubin Urine: NEGATIVE
Glucose, UA: NEGATIVE mg/dL
Hgb urine dipstick: NEGATIVE
Ketones, ur: NEGATIVE mg/dL
Leukocytes, UA: NEGATIVE
Nitrite: NEGATIVE
Protein, ur: NEGATIVE mg/dL
Specific Gravity, Urine: 1.005 (ref 1.005–1.030)
pH: 6 (ref 5.0–8.0)

## 2017-10-02 LAB — HEPARIN LEVEL (UNFRACTIONATED)
Heparin Unfractionated: 0.29 IU/mL — ABNORMAL LOW (ref 0.30–0.70)
Heparin Unfractionated: 0.51 IU/mL (ref 0.30–0.70)

## 2017-10-02 LAB — PROTIME-INR
INR: 0.96
Prothrombin Time: 12.7 seconds (ref 11.4–15.2)

## 2017-10-02 LAB — ECHOCARDIOGRAM COMPLETE
Height: 71 in
Weight: 2982.4 oz

## 2017-10-02 LAB — HEMOGLOBIN A1C
Hgb A1c MFr Bld: 5.7 % — ABNORMAL HIGH (ref 4.8–5.6)
Mean Plasma Glucose: 116.89 mg/dL

## 2017-10-02 LAB — SURGICAL PCR SCREEN
MRSA, PCR: NEGATIVE
Staphylococcus aureus: NEGATIVE

## 2017-10-02 LAB — TYPE AND SCREEN
ABO/RH(D): A NEG
Antibody Screen: NEGATIVE

## 2017-10-02 LAB — ABO/RH: ABO/RH(D): A NEG

## 2017-10-02 LAB — APTT: aPTT: 92 seconds — ABNORMAL HIGH (ref 24–36)

## 2017-10-02 MED ORDER — NITROGLYCERIN IN D5W 200-5 MCG/ML-% IV SOLN
2.0000 ug/min | INTRAVENOUS | Status: DC
  Filled 2017-10-02: qty 250

## 2017-10-02 MED ORDER — TRANEXAMIC ACID 1000 MG/10ML IV SOLN: 1.5000 mg/kg/h | INTRAVENOUS | Status: DC

## 2017-10-02 MED ORDER — BISACODYL 5 MG PO TBEC: 5.0000 mg | DELAYED_RELEASE_TABLET | Freq: Once | ORAL | Status: DC

## 2017-10-02 MED ORDER — DEXMEDETOMIDINE HCL IN NACL 400 MCG/100ML IV SOLN
0.1000 ug/kg/h | INTRAVENOUS | Status: DC
  Filled 2017-10-02: qty 100

## 2017-10-02 MED ORDER — TEMAZEPAM 15 MG PO CAPS
15.0000 mg | ORAL_CAPSULE | Freq: Once | ORAL | Status: DC | PRN
  Filled 2017-10-02: qty 1

## 2017-10-02 MED ORDER — SODIUM CHLORIDE 0.9 % IV SOLN: INTRAVENOUS | Status: DC

## 2017-10-02 MED ORDER — TRANEXAMIC ACID (OHS) BOLUS VIA INFUSION
15.0000 mg/kg | INTRAVENOUS | Status: DC
  Filled 2017-10-02: qty 1269

## 2017-10-02 MED ORDER — MAGNESIUM SULFATE 50 % IJ SOLN
40.0000 meq | INTRAMUSCULAR | Status: DC
  Filled 2017-10-02: qty 9.85

## 2017-10-02 MED ORDER — TRANEXAMIC ACID 1000 MG/10ML IV SOLN
1.5000 mg/kg/h | INTRAVENOUS | Status: DC
  Filled 2017-10-02: qty 25

## 2017-10-02 MED ORDER — SODIUM CHLORIDE 0.9 % IV SOLN: 1.5000 g | INTRAVENOUS | Status: DC

## 2017-10-02 MED ORDER — VANCOMYCIN HCL 10 G IV SOLR: 1500.0000 mg | INTRAVENOUS | Status: DC

## 2017-10-02 MED ORDER — SODIUM CHLORIDE 0.9 % IV SOLN
INTRAVENOUS | Status: DC
  Filled 2017-10-02: qty 30

## 2017-10-02 MED ORDER — SODIUM CHLORIDE 0.9 % IV SOLN
750.0000 mg | INTRAVENOUS | Status: DC
  Filled 2017-10-02: qty 750

## 2017-10-02 MED ORDER — TRANEXAMIC ACID (OHS) PUMP PRIME SOLUTION
2.0000 mg/kg | INTRAVENOUS | Status: DC
  Filled 2017-10-02: qty 1.69

## 2017-10-02 MED ORDER — MILRINONE LACTATE IN DEXTROSE 20-5 MG/100ML-% IV SOLN
0.1250 ug/kg/min | INTRAVENOUS | Status: DC
  Filled 2017-10-02: qty 100

## 2017-10-02 MED ORDER — TRANEXAMIC ACID (OHS) BOLUS VIA INFUSION: 15.0000 mg/kg | INTRAVENOUS | Status: DC

## 2017-10-02 MED ORDER — SODIUM CHLORIDE 0.9 % IV SOLN: 750.0000 mg | INTRAVENOUS | Status: DC

## 2017-10-02 MED ORDER — DOPAMINE-DEXTROSE 3.2-5 MG/ML-% IV SOLN: 0.0000 ug/kg/min | INTRAVENOUS | Status: DC

## 2017-10-02 MED ORDER — NITROGLYCERIN IN D5W 200-5 MCG/ML-% IV SOLN: 2.0000 ug/min | INTRAVENOUS | Status: DC

## 2017-10-02 MED ORDER — CHLORHEXIDINE GLUCONATE CLOTH 2 % EX PADS
6.0000 | MEDICATED_PAD | Freq: Once | CUTANEOUS | Status: AC
  Administered 2017-10-03: 6 via TOPICAL

## 2017-10-02 MED ORDER — PLASMA-LYTE 148 IV SOLN: INTRAVENOUS | Status: DC

## 2017-10-02 MED ORDER — SODIUM CHLORIDE 0.9 % IV SOLN
INTRAVENOUS | Status: DC
  Filled 2017-10-02: qty 1

## 2017-10-02 MED ORDER — POTASSIUM CHLORIDE 2 MEQ/ML IV SOLN
80.0000 meq | INTRAVENOUS | Status: DC
  Filled 2017-10-02: qty 40

## 2017-10-02 MED ORDER — CHLORHEXIDINE GLUCONATE CLOTH 2 % EX PADS
6.0000 | MEDICATED_PAD | Freq: Once | CUTANEOUS | Status: AC
  Administered 2017-10-02: 6 via TOPICAL

## 2017-10-02 MED ORDER — DOPAMINE-DEXTROSE 3.2-5 MG/ML-% IV SOLN
0.0000 ug/kg/min | INTRAVENOUS | Status: DC
  Filled 2017-10-02: qty 250

## 2017-10-02 MED ORDER — CARVEDILOL 3.125 MG PO TABS
3.1250 mg | ORAL_TABLET | Freq: Two times a day (BID) | ORAL | Status: DC
  Administered 2017-10-02 (×2): 3.125 mg via ORAL
  Filled 2017-10-02 (×2): qty 1

## 2017-10-02 MED ORDER — VANCOMYCIN HCL 10 G IV SOLR
1500.0000 mg | INTRAVENOUS | Status: DC
  Filled 2017-10-02: qty 1500

## 2017-10-02 MED ORDER — DEXMEDETOMIDINE HCL IN NACL 400 MCG/100ML IV SOLN: 0.1000 ug/kg/h | INTRAVENOUS | Status: DC

## 2017-10-02 MED ORDER — CHLORHEXIDINE GLUCONATE 0.12 % MT SOLN
15.0000 mL | Freq: Once | OROMUCOSAL | Status: AC
  Administered 2017-10-03: 15 mL via OROMUCOSAL
  Filled 2017-10-02: qty 15

## 2017-10-02 MED ORDER — POTASSIUM CHLORIDE 2 MEQ/ML IV SOLN: 80.0000 meq | INTRAVENOUS | Status: DC

## 2017-10-02 MED ORDER — EPINEPHRINE PF 1 MG/ML IJ SOLN
0.0000 ug/min | INTRAVENOUS | Status: DC
  Filled 2017-10-02: qty 4

## 2017-10-02 MED ORDER — SODIUM CHLORIDE 0.9 % IV SOLN
1.5000 g | INTRAVENOUS | Status: DC
  Filled 2017-10-02: qty 1.5

## 2017-10-02 MED ORDER — EPINEPHRINE PF 1 MG/ML IJ SOLN: 0.0000 ug/min | INTRAVENOUS | Status: DC

## 2017-10-02 MED ORDER — PLASMA-LYTE 148 IV SOLN
INTRAVENOUS | Status: DC
  Filled 2017-10-02: qty 2.5

## 2017-10-02 MED ORDER — SODIUM CHLORIDE 0.9 % IV SOLN
30.0000 ug/min | INTRAVENOUS | Status: DC
  Filled 2017-10-02: qty 2

## 2017-10-02 MED ORDER — DIAZEPAM 5 MG PO TABS
5.0000 mg | ORAL_TABLET | Freq: Once | ORAL | Status: AC
  Administered 2017-10-03: 5 mg via ORAL
  Filled 2017-10-02: qty 1

## 2017-10-02 MED ORDER — SODIUM CHLORIDE 0.9 % IV SOLN: 30.0000 ug/min | INTRAVENOUS | Status: DC

## 2017-10-02 MED ORDER — METOPROLOL TARTRATE 12.5 MG HALF TABLET: 12.5000 mg | ORAL_TABLET | Freq: Once | ORAL | Status: DC

## 2017-10-02 NOTE — Progress Notes (Signed)
[place] for heparin Indication: chest pain/ACS  Allergies  Allergen Reactions  . Statins Other (See Comments)    Muscle pain Tried 3 different statins    Patient Measurements: Height: 5\' 11"  (180.3 cm) Weight: 186 lb 6.4 oz (84.6 kg) IBW/kg (Calculated) : 75.3 HEPARIN DW (KG): 85.3  Vital Signs:    Labs: Recent Labs    10/01/17 2234 10/02/17 0356 10/02/17 1712  HGB  --  12.6*  --   HCT  --  37.7*  --   PLT  --  228  --   APTT  --   --  92*  LABPROT  --   --  12.7  INR  --   --  0.96  HEPARINUNFRC 0.12* 0.29* 0.51  CREATININE  --  0.74 0.80    Estimated Creatinine Clearance: 88.9 mL/min (by C-G formula based on SCr of 0.8 mg/dL).   Medical History: Past Medical History:  Diagnosis Date  . Allergy   . Arthritis   . Cancer ([place]) 03/2016   prostate and bone , chemo completed  . Coronary artery disease   . GERD (gastroesophageal reflux disease)   . Myocardial infarction [facility]) 1989    Assessment: Jeff Decker is a 72 y.o. Male on Heparin for chest pain/ACS. No anticoagulation PTA.  HL therapeutic at 0.51 this evening, aptt also within goal at 92s. No bleeding issues noted.   Goal of Therapy:  Heparin level 0.3-0.7 units/ml Monitor platelets by anticoagulation protocol: Yes  Plan:  Continue heparin to 1450 units/hr  Monitor daily heparin level, CBC, s/sx of bleeding  Thank you for involving pharmacy in this patient's care.  Erin Hearing PharmD., BCPS Clinical Pharmacist 10/02/2017 7:09 PM

## 2017-10-02 NOTE — Progress Notes (Signed)
[id number] Gave pt care guide and discussed the importance of IS and mobility after surgery. Pt stated he has been using IS and can get to 2535ml. Wrote down how to view pre op video for pt. He stated he has [facility] booklet. Discussed staying in the tube and sternal precautions. Pt stated he has someone available 24/7 after discharge. Pt has attended CRP 2 in the past and he would like to do again. Will refer to program at discharge. Graylon Good RN BSN 10/02/2017 3:17 PM

## 2017-10-02 NOTE — Progress Notes (Signed)
Patient ID: Jeff Decker, male   DOB: Jun 11, 1945, 72 y.o.   MRN: [id number] CT Surgery:  He has been stable today and ambulating in the halls without chest pain or shortness of breath. Plan CABG in am. He and family have no further questions.

## 2017-10-02 NOTE — Progress Notes (Signed)
ANTICOAGULATION CONSULT NOTE - Follow Up Consult  Pharmacy Consult for Heparin Indication: awaiting CABG  Allergies  Allergen Reactions  . Statins Other (See Comments)    Muscle pain Tried 3 different statins    Patient Measurements: Height: 5\' 11"  (180.3 cm) Weight: 186 lb 6.4 oz (84.6 kg) IBW/kg (Calculated) : 75.3 Heparin Dosing Weight:   Vital Signs:    Labs: Recent Labs    10/01/17 2234 10/02/17 0356 10/02/17 1712  HGB  --  12.6*  --   HCT  --  37.7*  --   PLT  --  228  --   APTT  --   --  92*  LABPROT  --   --  12.7  INR  --   --  0.96  HEPARINUNFRC 0.12* 0.29* 0.51  CREATININE  --  0.74 0.80    Estimated Creatinine Clearance: 88.9 mL/min (by C-G formula based on SCr of 0.8 mg/dL).  Assessment: Anticoag: hep for ACS, awaiting CABG. No AC pta. Hg 12.6, plt wnl. Heparin level 0.29>0.51 now in goal range.  Goal of Therapy:  Heparin level 0.3-0.7 units/ml Monitor platelets by anticoagulation protocol: Yes   Plan:  Continue heparin 1450 units/hr Monitor daily heparin level, CBC, s/sx of bleeding CABG 9/5  Aoki Wedemeyer S. Alford Highland, PharmD, BCPS Clinical Staff Pharmacist  Eilene Ghazi Stillinger 10/02/2017,7:08 PM

## 2017-10-02 NOTE — Progress Notes (Signed)
2D Echocardiogram has been performed.  Jeff Decker 10/02/2017, 4:11 PM

## 2017-10-02 NOTE — Progress Notes (Signed)
[place] for heparin Indication: chest pain/ACS  Allergies  Allergen Reactions  . Statins Other (See Comments)    Muscle pain Tried 3 different statins    Patient Measurements: Height: 5\' 11"  (180.3 cm) Weight: 186 lb 6.4 oz (84.6 kg) IBW/kg (Calculated) : 75.3 HEPARIN DW (KG): 85.3  Vital Signs: Temp: 98.2 F (36.8 C) (09/04 0548) Temp Source: Oral (09/04 0548) BP: 113/73 (09/04 0548) Pulse Rate: 58 (09/04 0548)  Labs: Recent Labs    10/01/17 2234 10/02/17 0356  HGB  --  12.6*  HCT  --  37.7*  PLT  --  228  HEPARINUNFRC 0.12* 0.29*  CREATININE  --  0.74    Estimated Creatinine Clearance: 88.9 mL/min (by C-G formula based on SCr of 0.74 mg/dL).   Medical History: Past Medical History:  Diagnosis Date  . Allergy   . Arthritis   . Cancer ([place]) 03/2016   prostate and bone , chemo completed  . Coronary artery disease   . GERD (gastroesophageal reflux disease)   . Myocardial infarction [facility]) 1989    Assessment: Jeff Decker is a 72 y.o. Male on Heparin for chest pain/ACS. No anticoagulation PTA. HL subtherapeutic at 0.29. Hgb slightly low, Plt WNL. No bleeding or infusion issues per discussion with RN.  Goal of Therapy:  Heparin level 0.3-0.7 units/ml Monitor platelets by anticoagulation protocol: Yes  Plan:  Increase heparin to 1450 units/hr 9/4 @ 0930 Will check heparin level 6 hours after heparin increase Monitor daily heparin level, CBC, s/sx of bleeding CABG scheduled for 9/5.   Thank you for involving pharmacy in this patient's care.  Kerby Less, PharmD Student 3rd Year Pharmacy Student 10/02/2017 8:54 AM

## 2017-10-02 NOTE — Progress Notes (Signed)
   Progress Note  Patient Name: Giulian Goldring Date of Encounter: 10/02/2017  Primary Cardiologist: Dr Marlou Porch   Subjective   No Cp or dyspnea  Inpatient Medications    Scheduled Meds: . aspirin EC  81 mg Oral Daily  . calcium-vitamin D  1 tablet Oral Daily  . loratadine  10 mg Oral Daily  . multivitamin with minerals  1 tablet Oral Daily  . pantoprazole  40 mg Oral Daily  . sodium chloride flush  3 mL Intravenous Q12H   Continuous Infusions: . sodium chloride    . heparin 1,350 Units/hr (10/01/17 2326)   PRN Meds: sodium chloride, acetaminophen, nitroGLYCERIN, ondansetron (ZOFRAN) IV, sodium chloride flush   Vital Signs    Vitals:   10/01/17 0900 10/01/17 0905 10/01/17 1954 10/02/17 0548  BP: (!) 142/65 138/65 132/71 113/73  Pulse: (!) 59 62 63 (!) 58  Resp: 13 12  19   Temp:   97.9 F (36.6 C) 98.2 F (36.8 C)  TempSrc:   Oral Oral  SpO2: 98% 98% 97% 97%  Weight:    84.6 kg  Height:        Intake/Output Summary (Last 24 hours) at 10/02/2017 0804 Last data filed at 10/02/2017 0500 Gross per 24 hour  Intake 290.01 ml  Output 2250 ml  Net -1959.99 ml   Filed Weights   10/01/17 0548 10/02/17 0548  Weight: 85.3 kg 84.6 kg    Telemetry    Sinus- Personally Reviewed  Physical Exam   GEN: No acute distress.   Neck: No JVD Cardiac: RRR, no murmurs, rubs, or gallops.  Respiratory: Clear to auscultation bilaterally. GI: Soft, nontender, non-distended  MS: No edema, radial cath site with no hematoma. Neuro:  Nonfocal  Psych: Normal affect   Labs    Chemistry Recent Labs  Lab 09/25/17 0909 10/02/17 0356  NA 139 139  K 4.3 3.7  CL 100 105  CO2 25 25  GLUCOSE 98 118*  BUN 14 10  CREATININE 0.90 0.74  CALCIUM 9.4 9.1  GFRNONAA 85 >60  GFRAA 98 >60  ANIONGAP  --  9     Hematology Recent Labs  Lab 09/25/17 0909 10/02/17 0356  WBC 5.9 6.4  RBC 4.27 4.28  HGB 12.6* 12.6*  HCT 36.2* 37.7*  MCV 85 88.1  MCH 29.5 29.4  MCHC 34.8 33.4    RDW 13.8 12.4  PLT 282 228    Patient Profile     72 y.o. male admitted with unstable angina.  Catheterization reveals severe coronary disease and ejection fraction 35 to 40%.  Assessment & Plan    1 coronary artery disease-continue aspirin and heparin.  Patient is intolerant to statins.  For coronary artery bypass and graft on Thursday.  2 ischemic cardiomyopathy-echocardiogram today to better assess LV function.  Add carvedilol 3.125 mg twice daily.  Add ACE inhibitor or ARB postoperatively as blood pressure allows.  Repeat echocardiogram 3 months after revascularization.  3 hyperlipidemia-intolerant to statins.  Following discharge needs lipid clinic evaluation for consideration of Repatha.  For questions or updates, please contact [place] Please consult [email or web address] for contact info under Cardiology/STEMI.      Signed, Kirk Ruths, MD  10/02/2017, 8:04 AM

## 2017-10-02 NOTE — Progress Notes (Signed)
Pre-op Cardiac Surgery  Carotid Findings:  Bilateral ICAs 1-39% stenosis. Bilateral vertebral arteries patent with antegrade flow.   Upper Extremity Right Left  Brachial Pressures 118 123  Radial Waveforms Biphasic Biphasic  Ulnar Waveforms Biphasic Triphasic  Palmar Arch (Allen's Test) Within normal limits with compression in radial, Doppler waveforms decrease <50% with right ulnar compression.    Doppler waveforms remain within normal limits with left radial compression. Doppler waveform obliterate with left ulnar compression.        Lower  Extremity Right Left  Dorsalis Pedis Triphasic Triphasic  Anterior Tibial Triphasic Triphasic  Posterior Tibial Triphasic Triphasic  Ankle/Brachial Indices 1.29 1.23   Jeff Decker (RDMS RVT) 10/02/17 4:05 PM

## 2017-10-03 ENCOUNTER — Inpatient Hospital Stay (HOSPITAL_COMMUNITY): Payer: Medicare Other | Admitting: Anesthesiology

## 2017-10-03 ENCOUNTER — Inpatient Hospital Stay (HOSPITAL_COMMUNITY)
Admission: AD | Disposition: A | Discharge status: Home / Self Care | Payer: Self-pay | Source: Home / Self Care | Attending: Surgery

## 2017-10-03 ENCOUNTER — Inpatient Hospital Stay (HOSPITAL_COMMUNITY): Payer: Medicare Other

## 2017-10-03 DIAGNOSIS — I2510 Atherosclerotic heart disease of native coronary artery without angina pectoris: Secondary | ICD-10-CM | POA: Diagnosis present

## 2017-10-03 HISTORY — PX: CORONARY ARTERY BYPASS GRAFT: SHX141

## 2017-10-03 HISTORY — PX: TEE WITHOUT CARDIOVERSION: SHX5443

## 2017-10-03 LAB — POCT I-STAT 3, ART BLOOD GAS (G3+)
Acid-Base Excess: 1 mmol/L (ref 0.0–2.0)
Acid-Base Excess: 1 mmol/L (ref 0.0–2.0)
Acid-base deficit: 1 mmol/L (ref 0.0–2.0)
Acid-base deficit: 1 mmol/L (ref 0.0–2.0)
Acid-base deficit: 1 mmol/L (ref 0.0–2.0)
Bicarbonate: 25.6 mmol/L (ref 20.0–28.0)
Bicarbonate: 26.3 mmol/L (ref 20.0–28.0)
Bicarbonate: 27 mmol/L (ref 20.0–28.0)
Bicarbonate: 24.4 mmol/L (ref 20.0–28.0)
Bicarbonate: 25.2 mmol/L (ref 20.0–28.0)
O2 Saturation: 98 %
O2 Saturation: 99 %
O2 Saturation: 99 %
O2 Saturation: 100 %
O2 Saturation: 97 %
Patient temperature: 35.4
Patient temperature: 37.8
Patient temperature: 37.9
Patient temperature: 37.8
Patient temperature: 37.8
TCO2: 27 mmol/L (ref 22–32)
TCO2: 28 mmol/L (ref 22–32)
TCO2: 28 mmol/L (ref 22–32)
TCO2: 25 mmol/L (ref 22–32)
TCO2: 27 mmol/L (ref 22–32)
pCO2 arterial: 46.8 mmHg (ref 32.0–48.0)
pCO2 arterial: 56.8 mmHg — ABNORMAL HIGH (ref 32.0–48.0)
pCO2 arterial: 48.8 mmHg — ABNORMAL HIGH (ref 32.0–48.0)
pCO2 arterial: 35.6 mmHg (ref 32.0–48.0)
pCO2 arterial: 49.5 mmHg — ABNORMAL HIGH (ref 32.0–48.0)
pH, Arterial: 7.339 — ABNORMAL LOW (ref 7.350–7.450)
pH, Arterial: 7.277 — ABNORMAL LOW (ref 7.350–7.450)
pH, Arterial: 7.354 (ref 7.350–7.450)
pH, Arterial: 7.447 (ref 7.350–7.450)
pH, Arterial: 7.319 — ABNORMAL LOW (ref 7.350–7.450)
pO2, Arterial: 98 mmHg (ref 83.0–108.0)
pO2, Arterial: 149 mmHg — ABNORMAL HIGH (ref 83.0–108.0)
pO2, Arterial: 176 mmHg — ABNORMAL HIGH (ref 83.0–108.0)
pO2, Arterial: 164 mmHg — ABNORMAL HIGH (ref 83.0–108.0)
pO2, Arterial: 109 mmHg — ABNORMAL HIGH (ref 83.0–108.0)

## 2017-10-03 LAB — CBC
HCT: 38.7 % — ABNORMAL LOW (ref 39.0–52.0)
HCT: 30.2 % — ABNORMAL LOW (ref 39.0–52.0)
HCT: 31.3 % — ABNORMAL LOW (ref 39.0–52.0)
Hemoglobin: 12.7 g/dL — ABNORMAL LOW (ref 13.0–17.0)
Hemoglobin: 10.2 g/dL — ABNORMAL LOW (ref 13.0–17.0)
Hemoglobin: 10.3 g/dL — ABNORMAL LOW (ref 13.0–17.0)
MCH: 29.1 pg (ref 26.0–34.0)
MCH: 29.9 pg (ref 26.0–34.0)
MCH: 29.3 pg (ref 26.0–34.0)
MCHC: 32.8 g/dL (ref 30.0–36.0)
MCHC: 33.8 g/dL (ref 30.0–36.0)
MCHC: 32.9 g/dL (ref 30.0–36.0)
MCV: 88.8 fL (ref 78.0–100.0)
MCV: 88.6 fL (ref 78.0–100.0)
MCV: 89.2 fL (ref 78.0–100.0)
Platelets: 240 10*3/uL (ref 150–400)
Platelets: 155 10*3/uL (ref 150–400)
Platelets: 180 10*3/uL (ref 150–400)
RBC: 4.36 MIL/uL (ref 4.22–5.81)
RBC: 3.41 MIL/uL — ABNORMAL LOW (ref 4.22–5.81)
RBC: 3.51 MIL/uL — ABNORMAL LOW (ref 4.22–5.81)
RDW: 12.4 % (ref 11.5–15.5)
RDW: 12.4 % (ref 11.5–15.5)
RDW: 12.6 % (ref 11.5–15.5)
WBC: 6.7 10*3/uL (ref 4.0–10.5)
WBC: 11.1 10*3/uL — ABNORMAL HIGH (ref 4.0–10.5)
WBC: 10.7 10*3/uL — ABNORMAL HIGH (ref 4.0–10.5)

## 2017-10-03 LAB — POCT I-STAT, CHEM 8
BUN: 12 mg/dL (ref 8–23)
Calcium, Ion: 1.26 mmol/L (ref 1.15–1.40)
Chloride: 106 mmol/L (ref 98–111)
Creatinine, Ser: 0.7 mg/dL (ref 0.61–1.24)
Glucose, Bld: 122 mg/dL — ABNORMAL HIGH (ref 70–99)
HCT: 29 % — ABNORMAL LOW (ref 39.0–52.0)
Hemoglobin: 9.9 g/dL — ABNORMAL LOW (ref 13.0–17.0)
Potassium: 4.2 mmol/L (ref 3.5–5.1)
Sodium: 136 mmol/L (ref 135–145)
TCO2: 25 mmol/L (ref 22–32)

## 2017-10-03 LAB — BASIC METABOLIC PANEL
Anion gap: 10 (ref 5–15)
BUN: 13 mg/dL (ref 8–23)
CO2: 25 mmol/L (ref 22–32)
Calcium: 9 mg/dL (ref 8.9–10.3)
Chloride: 103 mmol/L (ref 98–111)
Creatinine, Ser: 0.85 mg/dL (ref 0.61–1.24)
GFR calc Af Amer: >60 mL/min (ref >60)
GFR calc non Af Amer: >60 mL/min (ref >60)
Glucose, Bld: 127 mg/dL — ABNORMAL HIGH (ref 70–99)
Potassium: 3.6 mmol/L (ref 3.5–5.1)
Sodium: 138 mmol/L (ref 135–145)

## 2017-10-03 LAB — APTT: aPTT: 33 seconds (ref 24–36)

## 2017-10-03 LAB — CREATININE, SERUM
Creatinine, Ser: 0.73 mg/dL (ref 0.61–1.24)
GFR calc Af Amer: >60 mL/min (ref >60)
GFR calc non Af Amer: >60 mL/min (ref >60)

## 2017-10-03 LAB — MAGNESIUM: Magnesium: 2.7 mg/dL — ABNORMAL HIGH (ref 1.7–2.4)

## 2017-10-03 LAB — GLUCOSE, CAPILLARY
Glucose-Capillary: 108 mg/dL — ABNORMAL HIGH (ref 70–99)
Glucose-Capillary: 110 mg/dL — ABNORMAL HIGH (ref 70–99)
Glucose-Capillary: 114 mg/dL — ABNORMAL HIGH (ref 70–99)
Glucose-Capillary: 119 mg/dL — ABNORMAL HIGH (ref 70–99)
Glucose-Capillary: 122 mg/dL — ABNORMAL HIGH (ref 70–99)
Glucose-Capillary: 134 mg/dL — ABNORMAL HIGH (ref 70–99)

## 2017-10-03 LAB — POCT I-STAT 4, (NA,K, GLUC, HGB,HCT)
Glucose, Bld: 102 mg/dL — ABNORMAL HIGH (ref 70–99)
HCT: 24 % — ABNORMAL LOW (ref 39.0–52.0)
Hemoglobin: 8.2 g/dL — ABNORMAL LOW (ref 13.0–17.0)
Potassium: 3.5 mmol/L (ref 3.5–5.1)
Sodium: 141 mmol/L (ref 135–145)

## 2017-10-03 LAB — PROTIME-INR
INR: 1.22
Prothrombin Time: 15.3 seconds — ABNORMAL HIGH (ref 11.4–15.2)

## 2017-10-03 LAB — HEMOGLOBIN AND HEMATOCRIT, BLOOD
HCT: 27.2 % — ABNORMAL LOW (ref 39.0–52.0)
Hemoglobin: 9.2 g/dL — ABNORMAL LOW (ref 13.0–17.0)

## 2017-10-03 LAB — PLATELET COUNT: Platelets: 175 10*3/uL (ref 150–400)

## 2017-10-03 SURGERY — CORONARY ARTERY BYPASS GRAFTING (CABG)
Anesthesia: General | Site: Chest

## 2017-10-03 MED ORDER — SODIUM CHLORIDE 0.9% FLUSH: 3.0000 mL | INTRAVENOUS | Status: DC | PRN

## 2017-10-03 MED ORDER — INSULIN REGULAR BOLUS VIA INFUSION
0.0000 [IU] | Freq: Three times a day (TID) | INTRAVENOUS | Status: DC
  Filled 2017-10-03: qty 10

## 2017-10-03 MED ORDER — SODIUM CHLORIDE 0.9 % IV SOLN: INTRAVENOUS | Status: DC

## 2017-10-03 MED ORDER — LACTATED RINGERS IV SOLN
INTRAVENOUS | Status: DC
  Administered 2017-10-04: 05:00:00 via INTRAVENOUS

## 2017-10-03 MED ORDER — LACTATED RINGERS IV SOLN
INTRAVENOUS | Status: DC | PRN
  Administered 2017-10-03 (×5): via INTRAVENOUS

## 2017-10-03 MED ORDER — MIDAZOLAM HCL 2 MG/2ML IJ SOLN: 2.0000 mg | INTRAMUSCULAR | Status: DC | PRN

## 2017-10-03 MED ORDER — OXYCODONE HCL 5 MG PO TABS
5.0000 mg | ORAL_TABLET | ORAL | Status: DC | PRN
  Administered 2017-10-04 – 2017-10-05 (×6): 5 mg via ORAL
  Filled 2017-10-03 (×6): qty 1

## 2017-10-03 MED ORDER — PROTAMINE SULFATE 10 MG/ML IV SOLN
INTRAVENOUS | Status: AC
  Filled 2017-10-03: qty 10

## 2017-10-03 MED ORDER — METOPROLOL TARTRATE 5 MG/5ML IV SOLN: 2.5000 mg | INTRAVENOUS | Status: DC | PRN

## 2017-10-03 MED ORDER — METOPROLOL TARTRATE 12.5 MG HALF TABLET
12.5000 mg | ORAL_TABLET | Freq: Two times a day (BID) | ORAL | Status: DC
  Administered 2017-10-04 – 2017-10-05 (×2): 12.5 mg via ORAL
  Filled 2017-10-03 (×2): qty 1

## 2017-10-03 MED ORDER — PHENYLEPHRINE HCL-NACL 20-0.9 MG/250ML-% IV SOLN
0.0000 ug/min | INTRAVENOUS | Status: DC
  Administered 2017-10-03: 10 ug/min via INTRAVENOUS
  Filled 2017-10-03: qty 250

## 2017-10-03 MED ORDER — POTASSIUM CHLORIDE 10 MEQ/50ML IV SOLN
10.0000 meq | INTRAVENOUS | Status: AC
  Administered 2017-10-03 (×3): 10 meq via INTRAVENOUS

## 2017-10-03 MED ORDER — VANCOMYCIN HCL IN DEXTROSE 1-5 GM/200ML-% IV SOLN
1000.0000 mg | Freq: Once | INTRAVENOUS | Status: AC
  Administered 2017-10-03: 1000 mg via INTRAVENOUS
  Filled 2017-10-03 (×2): qty 200

## 2017-10-03 MED ORDER — NITROGLYCERIN IN D5W 200-5 MCG/ML-% IV SOLN: 0.0000 ug/min | INTRAVENOUS | Status: DC

## 2017-10-03 MED ORDER — ONDANSETRON HCL 4 MG/2ML IJ SOLN: 4.0000 mg | Freq: Four times a day (QID) | INTRAMUSCULAR | Status: DC | PRN

## 2017-10-03 MED ORDER — FAMOTIDINE IN NACL 20-0.9 MG/50ML-% IV SOLN
20.0000 mg | Freq: Two times a day (BID) | INTRAVENOUS | Status: DC
  Administered 2017-10-03: 20 mg via INTRAVENOUS

## 2017-10-03 MED ORDER — VANCOMYCIN HCL 10 G IV SOLR
1500.0000 mg | INTRAVENOUS | Status: AC
  Administered 2017-10-03: 1500 mg via INTRAVENOUS

## 2017-10-03 MED ORDER — ORAL CARE MOUTH RINSE
15.0000 mL | OROMUCOSAL | Status: DC
  Administered 2017-10-03 – 2017-10-04 (×2): 15 mL via OROMUCOSAL

## 2017-10-03 MED ORDER — PROTAMINE SULFATE 10 MG/ML IV SOLN
INTRAVENOUS | Status: AC
  Filled 2017-10-03: qty 25

## 2017-10-03 MED ORDER — DOPAMINE-DEXTROSE 3.2-5 MG/ML-% IV SOLN: 0.0000 ug/kg/min | INTRAVENOUS | Status: DC

## 2017-10-03 MED ORDER — MIDAZOLAM HCL 5 MG/ML IJ SOLN
INTRAMUSCULAR | Status: DC | PRN
  Administered 2017-10-03 (×2): 2 mg via INTRAVENOUS
  Administered 2017-10-03: 6 mg via INTRAVENOUS

## 2017-10-03 MED ORDER — BISACODYL 5 MG PO TBEC
10.0000 mg | DELAYED_RELEASE_TABLET | Freq: Every day | ORAL | Status: DC
  Administered 2017-10-04 – 2017-10-05 (×2): 10 mg via ORAL
  Filled 2017-10-03 (×2): qty 2

## 2017-10-03 MED ORDER — LACTATED RINGERS IV SOLN: 500.0000 mL | Freq: Once | INTRAVENOUS | Status: DC | PRN

## 2017-10-03 MED ORDER — SODIUM CHLORIDE 0.9 % IV SOLN
INTRAVENOUS | Status: DC | PRN
  Administered 2017-10-03: 750 mg via INTRAVENOUS

## 2017-10-03 MED ORDER — PLASMA-LYTE 148 IV SOLN
INTRAVENOUS | Status: AC
  Administered 2017-10-03: 09:00:00
  Filled 2017-10-03: qty 2.5

## 2017-10-03 MED ORDER — TRAMADOL HCL 50 MG PO TABS: 50.0000 mg | ORAL_TABLET | ORAL | Status: DC | PRN

## 2017-10-03 MED ORDER — ASPIRIN 81 MG PO CHEW: 324.0000 mg | CHEWABLE_TABLET | Freq: Every day | ORAL | Status: DC

## 2017-10-03 MED ORDER — SODIUM CHLORIDE 0.9 % IV SOLN
INTRAVENOUS | Status: DC
  Administered 2017-10-03: 13:00:00 via INTRAVENOUS

## 2017-10-03 MED ORDER — POTASSIUM CHLORIDE 10 MEQ/50ML IV SOLN
10.0000 meq | INTRAVENOUS | Status: AC
  Administered 2017-10-03: 10 meq via INTRAVENOUS
  Filled 2017-10-03: qty 50

## 2017-10-03 MED ORDER — EPINEPHRINE PF 1 MG/ML IJ SOLN: 0.0000 ug/min | INTRAVENOUS | Status: DC

## 2017-10-03 MED ORDER — FENTANYL CITRATE (PF) 250 MCG/5ML IJ SOLN
INTRAMUSCULAR | Status: AC
  Filled 2017-10-03: qty 5

## 2017-10-03 MED ORDER — HEMOSTATIC AGENTS (NO CHARGE) OPTIME
TOPICAL | Status: DC | PRN
  Administered 2017-10-03 (×2): 1 via TOPICAL

## 2017-10-03 MED ORDER — ASPIRIN EC 325 MG PO TBEC
325.0000 mg | DELAYED_RELEASE_TABLET | Freq: Every day | ORAL | Status: DC
  Administered 2017-10-04 – 2017-10-05 (×2): 325 mg via ORAL
  Filled 2017-10-03 (×2): qty 1

## 2017-10-03 MED ORDER — PROPOFOL 10 MG/ML IV BOLUS
INTRAVENOUS | Status: DC | PRN
  Administered 2017-10-03: 80 mg via INTRAVENOUS
  Administered 2017-10-03: 100 mg via INTRAVENOUS
  Administered 2017-10-03: 20 mg via INTRAVENOUS

## 2017-10-03 MED ORDER — LACTATED RINGERS IV SOLN: INTRAVENOUS | Status: DC

## 2017-10-03 MED ORDER — TRANEXAMIC ACID (OHS) BOLUS VIA INFUSION
15.0000 mg/kg | INTRAVENOUS | Status: AC
  Administered 2017-10-03: 1335 mg via INTRAVENOUS
  Filled 2017-10-03: qty 1335

## 2017-10-03 MED ORDER — FENTANYL CITRATE (PF) 250 MCG/5ML IJ SOLN
INTRAMUSCULAR | Status: AC
  Filled 2017-10-03: qty 20

## 2017-10-03 MED ORDER — ROCURONIUM BROMIDE 10 MG/ML (PF) SYRINGE
PREFILLED_SYRINGE | INTRAVENOUS | Status: DC | PRN
  Administered 2017-10-03 (×4): 50 mg via INTRAVENOUS

## 2017-10-03 MED ORDER — SODIUM CHLORIDE 0.9 % IV SOLN
1.5000 g | Freq: Two times a day (BID) | INTRAVENOUS | Status: AC
  Administered 2017-10-03 – 2017-10-05 (×4): 1.5 g via INTRAVENOUS
  Filled 2017-10-03 (×4): qty 1.5

## 2017-10-03 MED ORDER — MIDAZOLAM HCL 10 MG/2ML IJ SOLN
INTRAMUSCULAR | Status: AC
  Filled 2017-10-03: qty 2

## 2017-10-03 MED ORDER — SODIUM CHLORIDE 0.9 % IV SOLN
INTRAVENOUS | Status: DC | PRN
  Administered 2017-10-03: 1 [IU]/h via INTRAVENOUS

## 2017-10-03 MED ORDER — PROTAMINE SULFATE 10 MG/ML IV SOLN
INTRAVENOUS | Status: DC | PRN
  Administered 2017-10-03: 80 mg via INTRAVENOUS
  Administered 2017-10-03: 50 mg via INTRAVENOUS
  Administered 2017-10-03: 10 mg via INTRAVENOUS
  Administered 2017-10-03: 80 mg via INTRAVENOUS
  Administered 2017-10-03: 20 mg via INTRAVENOUS
  Administered 2017-10-03: 30 mg via INTRAVENOUS
  Administered 2017-10-03: 50 mg via INTRAVENOUS

## 2017-10-03 MED ORDER — ACETAMINOPHEN 650 MG RE SUPP: 650.0000 mg | Freq: Once | RECTAL | Status: AC

## 2017-10-03 MED ORDER — SODIUM CHLORIDE 0.9% FLUSH
3.0000 mL | Freq: Two times a day (BID) | INTRAVENOUS | Status: DC
  Administered 2017-10-04 – 2017-10-05 (×3): 3 mL via INTRAVENOUS

## 2017-10-03 MED ORDER — MAGNESIUM SULFATE 50 % IJ SOLN: 40.0000 meq | INTRAMUSCULAR | Status: DC

## 2017-10-03 MED ORDER — ROCURONIUM BROMIDE 50 MG/5ML IV SOSY
PREFILLED_SYRINGE | INTRAVENOUS | Status: AC
  Filled 2017-10-03: qty 10

## 2017-10-03 MED ORDER — HEPARIN SODIUM (PORCINE) 1000 UNIT/ML IJ SOLN
INTRAMUSCULAR | Status: DC | PRN
  Administered 2017-10-03: 32000 [IU] via INTRAVENOUS
  Administered 2017-10-03: 5000 [IU] via INTRAVENOUS

## 2017-10-03 MED ORDER — PANTOPRAZOLE SODIUM 40 MG PO TBEC
40.0000 mg | DELAYED_RELEASE_TABLET | Freq: Every day | ORAL | Status: DC
  Administered 2017-10-05: 40 mg via ORAL
  Filled 2017-10-03: qty 1

## 2017-10-03 MED ORDER — HEPARIN SODIUM (PORCINE) 1000 UNIT/ML IJ SOLN
INTRAMUSCULAR | Status: AC
  Filled 2017-10-03: qty 1

## 2017-10-03 MED ORDER — DEXMEDETOMIDINE HCL IN NACL 200 MCG/50ML IV SOLN
INTRAVENOUS | Status: AC
  Filled 2017-10-03: qty 50

## 2017-10-03 MED ORDER — ALBUMIN HUMAN 5 % IV SOLN
250.0000 mL | INTRAVENOUS | Status: DC | PRN
  Administered 2017-10-03 (×2): 12.5 g via INTRAVENOUS

## 2017-10-03 MED ORDER — FENTANYL CITRATE (PF) 250 MCG/5ML IJ SOLN
INTRAMUSCULAR | Status: DC | PRN
  Administered 2017-10-03 (×5): 250 ug via INTRAVENOUS
  Administered 2017-10-03: 150 ug via INTRAVENOUS
  Administered 2017-10-03: 250 ug via INTRAVENOUS
  Administered 2017-10-03: 100 ug via INTRAVENOUS

## 2017-10-03 MED ORDER — THROMBIN 5000 UNITS EX SOLR
CUTANEOUS | Status: DC | PRN
  Administered 2017-10-03 (×3): 5000 [IU] via TOPICAL

## 2017-10-03 MED ORDER — ALBUMIN HUMAN 5 % IV SOLN
INTRAVENOUS | Status: DC | PRN
  Administered 2017-10-03 (×2): via INTRAVENOUS

## 2017-10-03 MED ORDER — MAGNESIUM SULFATE 4 GM/100ML IV SOLN
4.0000 g | Freq: Once | INTRAVENOUS | Status: AC
  Administered 2017-10-03: 4 g via INTRAVENOUS

## 2017-10-03 MED ORDER — ACETAMINOPHEN 160 MG/5ML PO SOLN: 1000.0000 mg | Freq: Four times a day (QID) | ORAL | Status: DC

## 2017-10-03 MED ORDER — THROMBIN 5000 UNITS EX SOLR
CUTANEOUS | Status: AC
  Filled 2017-10-03: qty 20000

## 2017-10-03 MED ORDER — NITROGLYCERIN IN D5W 200-5 MCG/ML-% IV SOLN
INTRAVENOUS | Status: DC | PRN
  Administered 2017-10-03: 16.6 ug/min via INTRAVENOUS

## 2017-10-03 MED ORDER — SODIUM CHLORIDE 0.9 % IV SOLN
250.0000 mL | INTRAVENOUS | Status: DC
  Administered 2017-10-04: 250 mL via INTRAVENOUS

## 2017-10-03 MED ORDER — 0.9 % SODIUM CHLORIDE (POUR BTL) OPTIME
TOPICAL | Status: DC | PRN
  Administered 2017-10-03: 5000 mL

## 2017-10-03 MED ORDER — CHLORHEXIDINE GLUCONATE 0.12 % MT SOLN
15.0000 mL | OROMUCOSAL | Status: AC
  Administered 2017-10-03: 15 mL via OROMUCOSAL

## 2017-10-03 MED ORDER — SODIUM CHLORIDE 0.45 % IV SOLN
INTRAVENOUS | Status: DC | PRN
  Administered 2017-10-03: 13:00:00 via INTRAVENOUS

## 2017-10-03 MED ORDER — DEXMEDETOMIDINE HCL IN NACL 200 MCG/50ML IV SOLN
0.0000 ug/kg/h | INTRAVENOUS | Status: DC
  Administered 2017-10-03: 0.7 ug/kg/h via INTRAVENOUS
  Filled 2017-10-03 (×2): qty 50

## 2017-10-03 MED ORDER — POTASSIUM CHLORIDE 2 MEQ/ML IV SOLN: 80.0000 meq | INTRAVENOUS | Status: DC

## 2017-10-03 MED ORDER — TRANEXAMIC ACID 1000 MG/10ML IV SOLN
1.5000 mg/kg/h | INTRAVENOUS | Status: AC
  Administered 2017-10-03: 1.5 mg/kg/h via INTRAVENOUS

## 2017-10-03 MED ORDER — METOPROLOL TARTRATE 25 MG/10 ML ORAL SUSPENSION: 12.5000 mg | Freq: Two times a day (BID) | ORAL | Status: DC

## 2017-10-03 MED ORDER — TRANEXAMIC ACID (OHS) PUMP PRIME SOLUTION
2.0000 mg/kg | INTRAVENOUS | Status: DC
  Filled 2017-10-03: qty 1.78

## 2017-10-03 MED ORDER — PROPOFOL 10 MG/ML IV BOLUS
INTRAVENOUS | Status: AC
  Filled 2017-10-03: qty 20

## 2017-10-03 MED ORDER — DOCUSATE SODIUM 100 MG PO CAPS
200.0000 mg | ORAL_CAPSULE | Freq: Every day | ORAL | Status: DC
  Administered 2017-10-04 – 2017-10-05 (×2): 200 mg via ORAL
  Filled 2017-10-03 (×2): qty 2

## 2017-10-03 MED ORDER — SODIUM CHLORIDE 0.9 % IV SOLN
1.5000 g | INTRAVENOUS | Status: AC
  Administered 2017-10-03: 1.5 g via INTRAVENOUS

## 2017-10-03 MED ORDER — NITROGLYCERIN IN D5W 200-5 MCG/ML-% IV SOLN: 2.0000 ug/min | INTRAVENOUS | Status: DC

## 2017-10-03 MED ORDER — DEXMEDETOMIDINE HCL IN NACL 400 MCG/100ML IV SOLN
0.1000 ug/kg/h | INTRAVENOUS | Status: AC
  Administered 2017-10-03: 0.7 ug/kg/h via INTRAVENOUS

## 2017-10-03 MED ORDER — SODIUM CHLORIDE 0.9 % IV SOLN: 750.0000 mg | INTRAVENOUS | Status: DC

## 2017-10-03 MED ORDER — ACETAMINOPHEN 500 MG PO TABS
1000.0000 mg | ORAL_TABLET | Freq: Four times a day (QID) | ORAL | Status: DC
  Administered 2017-10-03 – 2017-10-05 (×7): 1000 mg via ORAL
  Filled 2017-10-03 (×6): qty 2

## 2017-10-03 MED ORDER — ORAL CARE MOUTH RINSE
15.0000 mL | OROMUCOSAL | Status: DC
  Administered 2017-10-03: 15 mL via OROMUCOSAL

## 2017-10-03 MED ORDER — SODIUM CHLORIDE 0.9 % IV SOLN
INTRAVENOUS | Status: DC | PRN
  Administered 2017-10-03: 25 ug/min via INTRAVENOUS

## 2017-10-03 MED ORDER — MORPHINE SULFATE (PF) 2 MG/ML IV SOLN
2.0000 mg | INTRAVENOUS | Status: DC | PRN
  Administered 2017-10-04 (×2): 2 mg via INTRAVENOUS
  Administered 2017-10-05: 4 mg via INTRAVENOUS
  Filled 2017-10-03: qty 1
  Filled 2017-10-03: qty 2
  Filled 2017-10-03: qty 1

## 2017-10-03 MED ORDER — BISACODYL 10 MG RE SUPP: 10.0000 mg | Freq: Every day | RECTAL | Status: DC

## 2017-10-03 MED ORDER — CHLORHEXIDINE GLUCONATE 0.12% ORAL RINSE (MEDLINE KIT)
15.0000 mL | Freq: Two times a day (BID) | OROMUCOSAL | Status: DC
  Administered 2017-10-03: 15 mL via OROMUCOSAL

## 2017-10-03 MED ORDER — SODIUM CHLORIDE 0.9 % IV SOLN
INTRAVENOUS | Status: DC
  Filled 2017-10-03: qty 1

## 2017-10-03 MED ORDER — SODIUM CHLORIDE 0.9 % IV SOLN
30.0000 ug/min | INTRAVENOUS | Status: DC
  Filled 2017-10-03: qty 2

## 2017-10-03 MED ORDER — MORPHINE SULFATE (PF) 2 MG/ML IV SOLN: 1.0000 mg | INTRAVENOUS | Status: AC | PRN

## 2017-10-03 MED ORDER — ACETAMINOPHEN 160 MG/5ML PO SOLN
650.0000 mg | Freq: Once | ORAL | Status: AC
  Administered 2017-10-03: 650 mg

## 2017-10-03 SURGICAL SUPPLY — 105 items
ADH SKN CLS APL DERMABOND .7 (GAUZE/BANDAGES/DRESSINGS) ×2
BAG DECANTER FOR FLEXI CONT (MISCELLANEOUS) ×3 IMPLANT
BANDAGE ACE 4X5 VEL STRL LF (GAUZE/BANDAGES/DRESSINGS) ×3 IMPLANT
BANDAGE ACE 6X5 VEL STRL LF (GAUZE/BANDAGES/DRESSINGS) ×3 IMPLANT
BANDAGE ELASTIC 4 VELCRO ST LF (GAUZE/BANDAGES/DRESSINGS) ×1 IMPLANT
BANDAGE ELASTIC 6 VELCRO ST LF (GAUZE/BANDAGES/DRESSINGS) ×1 IMPLANT
BASKET HEART (ORDER IN 25'S) (MISCELLANEOUS) ×1
BASKET HEART (ORDER IN 25S) (MISCELLANEOUS) ×2 IMPLANT
BLADE STERNUM SYSTEM 6 (BLADE) ×3 IMPLANT
BLADE SURG 11 STRL SS (BLADE) ×1 IMPLANT
BNDG GAUZE ELAST 4 BULKY (GAUZE/BANDAGES/DRESSINGS) ×3 IMPLANT
CANISTER SUCT 3000ML PPV (MISCELLANEOUS) ×3 IMPLANT
CATH ROBINSON RED A/P 18FR (CATHETERS) ×6 IMPLANT
CATH THORACIC 28FR (CATHETERS) ×3 IMPLANT
CATH THORACIC 36FR (CATHETERS) ×3 IMPLANT
CATH THORACIC 36FR RT ANG (CATHETERS) ×3 IMPLANT
CLIP VESOCCLUDE MED 24/CT (CLIP) IMPLANT
CLIP VESOCCLUDE SM WIDE 24/CT (CLIP) ×2 IMPLANT
CRADLE DONUT ADULT HEAD (MISCELLANEOUS) ×3 IMPLANT
DERMABOND ADVANCED (GAUZE/BANDAGES/DRESSINGS) ×1
DERMABOND ADVANCED .7 DNX12 (GAUZE/BANDAGES/DRESSINGS) IMPLANT
DRAPE CARDIOVASCULAR INCISE (DRAPES) ×3
DRAPE SLUSH/WARMER DISC (DRAPES) ×3 IMPLANT
DRAPE SRG 135X102X78XABS (DRAPES) ×2 IMPLANT
DRSG COVADERM 4X14 (GAUZE/BANDAGES/DRESSINGS) ×3 IMPLANT
ELECT CAUTERY BLADE 6.4 (BLADE) ×3 IMPLANT
ELECT REM PT RETURN 9FT ADLT (ELECTROSURGICAL) ×6
ELECTRODE REM PT RTRN 9FT ADLT (ELECTROSURGICAL) ×4 IMPLANT
FELT TEFLON 1X6 (MISCELLANEOUS) ×6 IMPLANT
GAUZE SPONGE 4X4 12PLY STRL (GAUZE/BANDAGES/DRESSINGS) ×6 IMPLANT
GAUZE SPONGE 4X4 12PLY STRL LF (GAUZE/BANDAGES/DRESSINGS) ×2 IMPLANT
GLOVE BIO SURGEON STRL SZ 6 (GLOVE) ×2 IMPLANT
GLOVE BIO SURGEON STRL SZ 6.5 (GLOVE) IMPLANT
GLOVE BIO SURGEON STRL SZ7 (GLOVE) IMPLANT
GLOVE BIO SURGEON STRL SZ7.5 (GLOVE) IMPLANT
GLOVE BIOGEL PI IND STRL 6 (GLOVE) IMPLANT
GLOVE BIOGEL PI IND STRL 6.5 (GLOVE) IMPLANT
GLOVE BIOGEL PI IND STRL 7.0 (GLOVE) IMPLANT
GLOVE BIOGEL PI INDICATOR 6 (GLOVE)
GLOVE BIOGEL PI INDICATOR 6.5 (GLOVE)
GLOVE BIOGEL PI INDICATOR 7.0 (GLOVE) ×2
GLOVE EUDERMIC 7 POWDERFREE (GLOVE) ×6 IMPLANT
GLOVE INDICATOR 6.5 STRL GRN (GLOVE) ×5 IMPLANT
GLOVE ORTHO TXT STRL SZ7.5 (GLOVE) IMPLANT
GOWN STRL REUS W/ TWL LRG LVL3 (GOWN DISPOSABLE) ×8 IMPLANT
GOWN STRL REUS W/ TWL XL LVL3 (GOWN DISPOSABLE) ×2 IMPLANT
GOWN STRL REUS W/TWL LRG LVL3 (GOWN DISPOSABLE) ×22 IMPLANT
GOWN STRL REUS W/TWL XL LVL3 (GOWN DISPOSABLE) ×3
HEMOSTAT ARISTA ABSORB 3G PWDR (MISCELLANEOUS) ×2 IMPLANT
HEMOSTAT SURGICEL 2X14 (HEMOSTASIS) ×3 IMPLANT
INSERT FOGARTY 61MM (MISCELLANEOUS) IMPLANT
INSERT FOGARTY XLG (MISCELLANEOUS) IMPLANT
KIT BASIN OR (CUSTOM PROCEDURE TRAY) ×3 IMPLANT
KIT CATH CPB BARTLE (MISCELLANEOUS) ×3 IMPLANT
KIT SUCTION CATH 14FR (SUCTIONS) ×3 IMPLANT
KIT TURNOVER KIT B (KITS) ×3 IMPLANT
KIT VASOVIEW HEMOPRO VH 3000 (KITS) ×4 IMPLANT
NS IRRIG 1000ML POUR BTL (IV SOLUTION) ×15 IMPLANT
PACK E OPEN HEART (SUTURE) ×3 IMPLANT
PACK OPEN HEART (CUSTOM PROCEDURE TRAY) ×3 IMPLANT
PAD ARMBOARD 7.5X6 YLW CONV (MISCELLANEOUS) ×6 IMPLANT
PAD ELECT DEFIB RADIOL ZOLL (MISCELLANEOUS) ×3 IMPLANT
PENCIL BUTTON HOLSTER BLD 10FT (ELECTRODE) ×3 IMPLANT
PUNCH AORTIC ROTATE 4.0MM (MISCELLANEOUS) IMPLANT
PUNCH AORTIC ROTATE 4.5MM 8IN (MISCELLANEOUS) ×3 IMPLANT
PUNCH AORTIC ROTATE 5MM 8IN (MISCELLANEOUS) IMPLANT
SET CARDIOPLEGIA MPS 5001102 (MISCELLANEOUS) ×1 IMPLANT
SPONGE INTESTINAL PEANUT (DISPOSABLE) IMPLANT
SPONGE LAP 18X18 X RAY DECT (DISPOSABLE) IMPLANT
SPONGE LAP 4X18 RFD (DISPOSABLE) ×3 IMPLANT
SUT BONE WAX W31G (SUTURE) ×3 IMPLANT
SUT MNCRL AB 4-0 PS2 18 (SUTURE) ×1 IMPLANT
SUT PROLENE 3 0 SH DA (SUTURE) IMPLANT
SUT PROLENE 3 0 SH1 36 (SUTURE) ×3 IMPLANT
SUT PROLENE 4 0 RB 1 (SUTURE)
SUT PROLENE 4 0 SH DA (SUTURE) IMPLANT
SUT PROLENE 4-0 RB1 .5 CRCL 36 (SUTURE) IMPLANT
SUT PROLENE 5 0 C 1 36 (SUTURE) IMPLANT
SUT PROLENE 6 0 C 1 30 (SUTURE) ×2 IMPLANT
SUT PROLENE 7 0 BV 1 (SUTURE) IMPLANT
SUT PROLENE 7 0 BV1 MDA (SUTURE) ×4 IMPLANT
SUT PROLENE 8 0 BV175 6 (SUTURE) IMPLANT
SUT SILK  1 MH (SUTURE)
SUT SILK 1 MH (SUTURE) IMPLANT
SUT STEEL STERNAL CCS#1 18IN (SUTURE) IMPLANT
SUT STEEL SZ 6 DBL 3X14 BALL (SUTURE) IMPLANT
SUT VIC AB 1 CTX 36 (SUTURE) ×6
SUT VIC AB 1 CTX36XBRD ANBCTR (SUTURE) ×4 IMPLANT
SUT VIC AB 2-0 CT1 27 (SUTURE) ×3
SUT VIC AB 2-0 CT1 TAPERPNT 27 (SUTURE) IMPLANT
SUT VIC AB 2-0 CTX 27 (SUTURE) IMPLANT
SUT VIC AB 3-0 SH 27 (SUTURE)
SUT VIC AB 3-0 SH 27X BRD (SUTURE) IMPLANT
SUT VIC AB 3-0 X1 27 (SUTURE) IMPLANT
SUT VICRYL 4-0 PS2 18IN ABS (SUTURE) IMPLANT
SYSTEM SAHARA CHEST DRAIN ATS (WOUND CARE) ×3 IMPLANT
TAPE CLOTH SURG 4X10 WHT LF (GAUZE/BANDAGES/DRESSINGS) ×1 IMPLANT
TAPE CLOTH SURG 6X10 WHT LF (GAUZE/BANDAGES/DRESSINGS) ×1 IMPLANT
TAPE PAPER 2X10 WHT MICROPORE (GAUZE/BANDAGES/DRESSINGS) ×1 IMPLANT
TOWEL GREEN STERILE (TOWEL DISPOSABLE) ×3 IMPLANT
TOWEL GREEN STERILE FF (TOWEL DISPOSABLE) ×3 IMPLANT
TRAY FOLEY SLVR 16FR TEMP STAT (SET/KITS/TRAYS/PACK) ×3 IMPLANT
TUBING INSUFFLATION (TUBING) ×3 IMPLANT
UNDERPAD 30X30 (UNDERPADS AND DIAPERS) ×3 IMPLANT
WATER STERILE IRR 1000ML POUR (IV SOLUTION) ×6 IMPLANT

## 2017-10-03 NOTE — Anesthesia Procedure Notes (Signed)
Arterial Line Insertion Start/End9/05/2017 6:50 AM, 10/03/2017 7:05 AM Performed by: Josephine Igo, CRNA, CRNA  Preanesthetic checklist: patient identified, IV checked, site marked, risks and benefits discussed, surgical consent, monitors and equipment checked, pre-op evaluation, timeout performed and anesthesia consent Left, radial was placed Catheter size: 20 G Hand hygiene performed  and maximum sterile barriers used   Attempts: 4 Procedure performed without using ultrasound guided technique. Following insertion, dressing applied and Biopatch. Post procedure assessment: normal  Patient tolerated the procedure well with no immediate complications.

## 2017-10-03 NOTE — Op Note (Signed)
CARDIOVASCULAR SURGERY OPERATIVE NOTE  10/03/2017  Surgeon:  Gaye Pollack, MD  First Assistant: Lars Pinks,  PA-C   Preoperative Diagnosis:  Severe left main and multi-vessel coronary artery disease   Postoperative Diagnosis:  Same   Procedure:  1. Median Sternotomy 2. Extracorporeal circulation 3.   Coronary artery bypass grafting x 4   Left internal mammary graft to the LAD  SVG to diagonal  SVG to distal LCX  SVG to PDA 4.   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  The patient reports being in his usual state of health until the past 3 weeks when he began having episodes of substernal chest discomfort and shortness of breath with exertion.  His last episode was yesterday.  He reports a normal energy level and said that a few weeks ago he completed a 5600 mile motorcycle trip to [place].  Cardiac catheterization today showed a 50% distal left main stenosis extending into the ostium of the LAD with significant calcification.  The ostial to proximal LAD had 90% stenosis.  The right coronary artery had 60% mid vessel stenosis and 99% stenosis at the ostium of the PDA branch.  The left circumflex was a large dominant vessel with no significant stenoses but compromised by the distal left main stenosis.  Left ventricular end-diastolic pressure is normal.  Left ventricular ejection fraction was reduced to 35 to 45%. I agree that coronary bypass graft surgery is the best treatment to prevent further ischemia and infarction and improve his quality of life.  I reviewed the cardiac  catheterization findings with the patient and his family and answered their questions.  I discussed the operative procedure with the patient and family including alternatives, benefits and risks; including but not limited to bleeding, blood transfusion, infection, stroke, myocardial infarction, graft failure, heart block requiring a permanent pacemaker, organ dysfunction, and death.  [name] understands and agrees to proceed.    Preparation:  The patient was seen in the preoperative holding area and the correct patient, correct operation were confirmed with the patient after reviewing the medical record and catheterization. The consent was signed by me. Preoperative antibiotics were given. A pulmonary arterial line and radial arterial line were placed by the anesthesia team. The patient was taken back to the operating room and positioned supine on the operating room table. After being placed under general endotracheal anesthesia by the anesthesia team a foley catheter was placed. The neck, chest, abdomen, and both legs were prepped with betadine soap and solution and draped in the usual sterile manner. A surgical time-out was taken and the correct patient and operative procedure were confirmed with the nursing and anesthesia staff.   Cardiopulmonary Bypass:  A median sternotomy was performed. The pericardium was opened in the midline. Right ventricular function appeared normal. The ascending aorta was of normal size and had no palpable plaque. There were no contraindications to aortic cannulation or cross-clamping. The patient was fully systemically heparinized and the ACT was maintained > 400 sec. The proximal aortic arch was cannulated with a 20 F aortic cannula for arterial inflow. Venous cannulation was performed via the right atrial appendage using a two-staged venous cannula. An antegrade cardioplegia/vent cannula was inserted into the mid-ascending  aorta. Aortic occlusion was performed  with a single cross-clamp. Systemic cooling to 32 degrees Centigrade and topical cooling of the heart with iced saline were used. Hyperkalemic antegrade cold blood cardioplegia was used to induce diastolic arrest and was then given at about 20 minute intervals throughout the period of arrest to maintain myocardial temperature at or below 10 degrees centigrade. A temperature probe was inserted into the interventricular septum and an insulating pad was placed in the pericardium.   Left internal mammary harvest:  The left side of the sternum was retracted using the Rultract retractor. The left internal mammary artery was harvested as a pedicle graft. All side branches were clipped. It was a medium-sized vessel of good quality with excellent blood flow. It was ligated distally and divided. It was sprayed with topical papaverine solution to prevent vasospasm.   Endoscopic vein harvest:  The right greater saphenous vein was harvested endoscopically through a 2 cm incision medial to the right knee. It was harvested from the upper thigh to below the knee. It was a medium-sized vein of good quality. The side branches were all ligated with 4-0 silk ties.    Coronary arteries:  The coronary arteries were examined. He has diffuse coronary plaque.   LAD:  Large vessel that is diffusely disease extending to the apex. The diagonal is a moderate sized vessel with proximal disease but the distal vessel was free of significant plaque.  LCX:  OM1 and OM2 are intramyocardial and not seen at all. The distal LCX is a moderate sized vessel with no significant disease in it.  RCA:  Severely and diffusely diseased with calcific plaque and this extends out into the mid PDA. The very distal PDA was graft-able.   Grafts:  1. LIMA to the LAD: 2.0 mm. It was sewn end to side using 8-0 prolene continuous suture. 2. SVG to diagonal:  1.6 mm. It was sewn end to side using 7-0 prolene continuous suture. 3. SVG to distal  LCX:  1.75 mm. It was sewn end to side using 7-0 prolene continuous suture. 4. SVG to distal PDA:  1.6 mm. It was sewn end to side using 7-0 prolene continuous suture.  The proximal vein graft anastomoses were performed to the mid-ascending aorta using continuous 6-0 prolene suture. Graft markers were placed around the proximal anastomoses.   Completion:  The patient was rewarmed to 37 degrees Centigrade. The clamp was removed from the LIMA pedicle and there was rapid warming of the septum and return of ventricular fibrillation. The crossclamp was removed with a time of 89 minutes. There was spontaneous return of sinus rhythm. The distal and proximal anastomoses were checked for hemostasis. The position of the grafts was satisfactory. Two temporary epicardial pacing wires were placed on the right atrium and two on the right ventricle. The patient was weaned from CPB without difficulty on no inotropes. CPB time was 106 minutes. Cardiac output was 4.5 LPM. Heparin was fully reversed with protamine and the aortic and venous cannulas removed. Hemostasis was achieved. Mediastinal and left pleural drainage tubes were placed. The sternum was closed with double #6 stainless steel wires. The fascia was closed with continuous # 1 vicryl suture. The subcutaneous tissue was closed with 2-0 vicryl continuous suture. The skin was closed with 3-0 vicryl subcuticular suture. All sponge, needle, and instrument counts were reported correct at the end of the case. Dry sterile dressings were placed over the incisions and around the chest tubes which were connected to pleurevac  suction. The patient was then transported to the surgical intensive care unit in stable condition.

## 2017-10-03 NOTE — Progress Notes (Signed)
  Echocardiogram Echocardiogram Transesophageal has been performed.  Jeff Decker 10/03/2017, 8:46 AM 

## 2017-10-03 NOTE — Anesthesia Procedure Notes (Signed)
Procedure Name: Intubation Date/Time: 10/03/2017 7:48 AM Performed by: Mariea Clonts, CRNA Pre-anesthesia Checklist: Patient identified, Emergency Drugs available, Suction available and Patient being monitored Patient Re-evaluated:Patient Re-evaluated prior to induction Oxygen Delivery Method: Circle System Utilized Preoxygenation: Pre-oxygenation with 100% oxygen Induction Type: IV induction Ventilation: Mask ventilation without difficulty Laryngoscope Size: Miller and 3 Grade View: Grade II Tube type: Oral Tube size: 8.0 mm Number of attempts: 1 Airway Equipment and Method: Stylet and Oral airway Placement Confirmation: ETT inserted through vocal cords under direct vision,  positive ETCO2 and breath sounds checked- equal and bilateral Tube secured with: Tape Dental Injury: Teeth and Oropharynx as per pre-operative assessment

## 2017-10-03 NOTE — Progress Notes (Signed)
RT note: rapid wean protocol initiated at 1825.

## 2017-10-03 NOTE — Progress Notes (Signed)
      [address]       [place] [address]             [phone]     S/p CABG x 4  Awake intubated, failed 1st wean attempt due to respiratory acidosis  BP (!) 113/54   Pulse 90   Temp 100.2 F (37.9 C)   Resp 16   Ht 5\' 11"  (1.803 m)   Wt 89 kg   SpO2 100%   BMI 27.38 kg/m   Intake/Output Summary (Last 24 hours) at 10/03/2017 1822 Last data filed at 10/03/2017 1600 Gross per 24 hour  Intake 4816.35 ml  Output 4295 ml  Net 521.35 ml   Stable early postop  Remo Lipps C. Roxan Hockey, MD [facility] [phone]

## 2017-10-03 NOTE — Anesthesia Procedure Notes (Signed)
Central Venous Catheter Insertion Performed by: Roberts Gaudy, MD, anesthesiologist Start/End9/05/2017 6:40 AM, 10/03/2017 6:50 AM Patient location: Pre-op. Preanesthetic checklist: patient identified, IV checked, site marked, risks and benefits discussed, surgical consent, monitors and equipment checked, pre-op evaluation, timeout performed and anesthesia consent Hand hygiene performed  and maximum sterile barriers used  PA cath was placed.Swan type:thermodilution Procedure performed without using ultrasound guided technique. Attempts: 1 Patient tolerated the procedure well with no immediate complications.

## 2017-10-03 NOTE — Anesthesia Preprocedure Evaluation (Signed)
Anesthesia Evaluation  Patient identified by MRN, date of birth, ID band Patient awake    Reviewed: Allergy & Precautions, NPO status , Patient's Chart, lab work & pertinent test results  Airway Mallampati: II  TM Distance: >3 FB     Dental   Pulmonary former smoker,    breath sounds clear to auscultation       Cardiovascular + angina + CAD and + Past MI   Rhythm:Regular Rate:Normal     Neuro/Psych    GI/Hepatic Neg liver ROS, GERD  ,  Endo/Other  negative endocrine ROS  Renal/GU negative Renal ROS     Musculoskeletal  (+) Arthritis ,   Abdominal   Peds  Hematology  (+) anemia ,   Anesthesia Other Findings   Reproductive/Obstetrics                             Anesthesia Physical Anesthesia Plan  ASA: III  Anesthesia Plan: General   Post-op Pain Management:    Induction: Intravenous  PONV Risk Score and Plan: 2 and Ondansetron, Dexamethasone and Midazolam  Airway Management Planned: Oral ETT  Additional Equipment: Arterial line, PA Cath and TEE  Intra-op Plan:   Post-operative Plan: Post-operative intubation/ventilation  Informed Consent: I have reviewed the patients History and Physical, chart, labs and discussed the procedure including the risks, benefits and alternatives for the proposed anesthesia with the patient or authorized representative who has indicated his/her understanding and acceptance.   Dental advisory given  Plan Discussed with: CRNA and Anesthesiologist  Anesthesia Plan Comments:         Anesthesia Quick Evaluation

## 2017-10-03 NOTE — Progress Notes (Signed)
RT note: patient placed back on full support due to patient's ABG results showing respiratory acidosis.  Will attempt around 1800.  Patient tolerating well.

## 2017-10-03 NOTE — Brief Op Note (Signed)
10/01/2017 - 10/03/2017  11:05 AM  PATIENT:  [name]  72 y.o. male  PRE-OPERATIVE DIAGNOSIS:  CAD  POST-OPERATIVE DIAGNOSIS:  CAD  PROCEDURE:  TRANSESOPHAGEAL ECHOCARDIOGRAM (TEE), MEDIAN STERNOTOMY for CORONARY ARTERY BYPASS GRAFTING (CABG) x 4 (LIMA to LAD, SVG to DIAGONAL, SVG to OM, SVG to PDA)  USING LEFT INTERNAL MAMMARY ARTERY AND RIGHT GREATER SAPHENOUS VEIN HARVESTED ENDOSCOPICALLY.   SURGEON:  Surgeon(s) and Role:    Bartle, Fernande Boyden, MD - Primary  PHYSICIAN ASSISTANT: Lars Pinks PA-C   ANESTHESIA:   general  EBL:  As recorded by perfusion and anesthesia   DRAINS: Chest tubes placed in the mediastinal and pleural spaces   COUNTS CORRECT:  YES  DICTATION: .Dragon Dictation  PLAN OF CARE: Admit to inpatient   PATIENT DISPOSITION:  ICU - intubated and hemodynamically stable.   Delay start of Pharmacological VTE agent (>24hrs) due to surgical blood loss or risk of bleeding: yes  BASELINE WEIGHT: 89 kg

## 2017-10-03 NOTE — Anesthesia Procedure Notes (Signed)
Central Venous Catheter Insertion Performed by: Roberts Gaudy, MD, anesthesiologist Start/End9/05/2017 6:40 AM, 10/03/2017 6:50 AM Patient location: Pre-op. Preanesthetic checklist: patient identified, IV checked, site marked, risks and benefits discussed, surgical consent, monitors and equipment checked, pre-op evaluation, timeout performed and anesthesia consent Lidocaine 1% used for infiltration and patient sedated Hand hygiene performed  and maximum sterile barriers used  Catheter size: 9 Fr Sheath introducer Procedure performed using ultrasound guided technique. Ultrasound Notes:anatomy identified, needle tip was noted to be adjacent to the nerve/plexus identified, no ultrasound evidence of intravascular and/or intraneural injection and image(s) printed for medical record Attempts: 1 Following insertion, line sutured and dressing applied. Post procedure assessment: blood return through all ports, free fluid flow and no air  Patient tolerated the procedure well with no immediate complications.

## 2017-10-03 NOTE — Transfer of Care (Signed)
Immediate Anesthesia Transfer of Care Note  Patient: Jeff Decker  Procedure(s) Performed: CORONARY ARTERY BYPASS GRAFTING (CABG) x 4, USING LEFT INTERNAL MAMMARY ARTERY AND RIGHT GREATER SAPHENOUS VEIN HARVESTED ENDOSCOPICALLY. LIMA TO LAD, SVG TO DIAG, SVG TO OM, SVG TO PDA. (N/A Chest) TRANSESOPHAGEAL ECHOCARDIOGRAM (TEE) (N/A )  Patient Location: SICU  Anesthesia Type:General  Level of Consciousness: sedated and unresponsive  Airway & Oxygen Therapy: Patient remains intubated per anesthesia plan and Patient placed on Ventilator (see vital sign flow sheet for setting)  Post-op Assessment: Report given to RN and Post -op Vital signs reviewed and stable  Post vital signs: Reviewed and stable  Last Vitals:  Vitals Value Taken Time  BP 109/53 10/03/2017  1:30 PM  Temp 35.4 C 10/03/2017  1:31 PM  Pulse 71 10/03/2017  1:31 PM  Resp 14 10/03/2017  1:31 PM  SpO2 97 % 10/03/2017  1:31 PM  Vitals shown include unvalidated device data.  Last Pain:  Vitals:   10/03/17 0433  TempSrc: Oral  PainSc:       Patients Stated Pain Goal: 0 (74/12/87 8676)  Complications: No apparent anesthesia complications

## 2017-10-03 NOTE — Progress Notes (Signed)
RT note: rapid wean protocol initiated.

## 2017-10-03 NOTE — Procedures (Signed)
Extubation Procedure Note  Patient Details:   Name: Jeff Decker DOB: Apr 28, 1945 MRN: [id number]   Airway Documentation:    Vent end date: 10/03/17 Vent end time: 1910   Evaluation  O2 sats: stable throughout Complications: No apparent complications Patient did tolerate procedure well. Bilateral Breath Sounds: Clear   Yes   Patient extubated to 3L nasal cannula per protocol.  Positive cuff leak noted.  No evidence stridor.  NIF of -34, VC of 2.4L .  Patient able to speak post extubation.  Sats currently 100%.  No complications noted.  Philomena Doheny 10/03/2017, 7:18 PM

## 2017-10-04 ENCOUNTER — Encounter (HOSPITAL_COMMUNITY): Payer: Self-pay | Admitting: Surgery

## 2017-10-04 ENCOUNTER — Inpatient Hospital Stay (HOSPITAL_COMMUNITY): Payer: Medicare Other

## 2017-10-04 DIAGNOSIS — Z951 Presence of aortocoronary bypass graft: Secondary | ICD-10-CM

## 2017-10-04 LAB — MAGNESIUM
Magnesium: 2.3 mg/dL (ref 1.7–2.4)
Magnesium: 2.2 mg/dL (ref 1.7–2.4)

## 2017-10-04 LAB — POCT I-STAT 3, ART BLOOD GAS (G3+)
Acid-Base Excess: 3 mmol/L — ABNORMAL HIGH (ref 0.0–2.0)
Bicarbonate: 27.5 mmol/L (ref 20.0–28.0)
Bicarbonate: 24.1 mmol/L (ref 20.0–28.0)
O2 Saturation: 100 %
O2 Saturation: 100 %
TCO2: 29 mmol/L (ref 22–32)
TCO2: 25 mmol/L (ref 22–32)
pCO2 arterial: 41.5 mmHg (ref 32.0–48.0)
pCO2 arterial: 35.9 mmHg (ref 32.0–48.0)
pH, Arterial: 7.43 (ref 7.350–7.450)
pH, Arterial: 7.434 (ref 7.350–7.450)
pO2, Arterial: 400 mmHg — ABNORMAL HIGH (ref 83.0–108.0)
pO2, Arterial: 311 mmHg — ABNORMAL HIGH (ref 83.0–108.0)

## 2017-10-04 LAB — POCT I-STAT, CHEM 8
BUN: 14 mg/dL (ref 8–23)
BUN: 13 mg/dL (ref 8–23)
BUN: 11 mg/dL (ref 8–23)
BUN: 12 mg/dL (ref 8–23)
BUN: 11 mg/dL (ref 8–23)
BUN: 12 mg/dL (ref 8–23)
BUN: 15 mg/dL (ref 8–23)
Calcium, Ion: 1.21 mmol/L (ref 1.15–1.40)
Calcium, Ion: 1.23 mmol/L (ref 1.15–1.40)
Calcium, Ion: 0.96 mmol/L — ABNORMAL LOW (ref 1.15–1.40)
Calcium, Ion: 1.1 mmol/L — ABNORMAL LOW (ref 1.15–1.40)
Calcium, Ion: 1.16 mmol/L (ref 1.15–1.40)
Calcium, Ion: 1.17 mmol/L (ref 1.15–1.40)
Calcium, Ion: 1.22 mmol/L (ref 1.15–1.40)
Chloride: 104 mmol/L (ref 98–111)
Chloride: 104 mmol/L (ref 98–111)
Chloride: 101 mmol/L (ref 98–111)
Chloride: 101 mmol/L (ref 98–111)
Chloride: 101 mmol/L (ref 98–111)
Chloride: 103 mmol/L (ref 98–111)
Chloride: 99 mmol/L (ref 98–111)
Creatinine, Ser: 0.6 mg/dL — ABNORMAL LOW (ref 0.61–1.24)
Creatinine, Ser: 0.6 mg/dL — ABNORMAL LOW (ref 0.61–1.24)
Creatinine, Ser: 0.4 mg/dL — ABNORMAL LOW (ref 0.61–1.24)
Creatinine, Ser: 0.5 mg/dL — ABNORMAL LOW (ref 0.61–1.24)
Creatinine, Ser: 0.4 mg/dL — ABNORMAL LOW (ref 0.61–1.24)
Creatinine, Ser: 0.4 mg/dL — ABNORMAL LOW (ref 0.61–1.24)
Creatinine, Ser: 0.8 mg/dL (ref 0.61–1.24)
Glucose, Bld: 111 mg/dL — ABNORMAL HIGH (ref 70–99)
Glucose, Bld: 112 mg/dL — ABNORMAL HIGH (ref 70–99)
Glucose, Bld: 115 mg/dL — ABNORMAL HIGH (ref 70–99)
Glucose, Bld: 155 mg/dL — ABNORMAL HIGH (ref 70–99)
Glucose, Bld: 129 mg/dL — ABNORMAL HIGH (ref 70–99)
Glucose, Bld: 149 mg/dL — ABNORMAL HIGH (ref 70–99)
Glucose, Bld: 137 mg/dL — ABNORMAL HIGH (ref 70–99)
HCT: 32 % — ABNORMAL LOW (ref 39.0–52.0)
HCT: 29 % — ABNORMAL LOW (ref 39.0–52.0)
HCT: 28 % — ABNORMAL LOW (ref 39.0–52.0)
HCT: 26 % — ABNORMAL LOW (ref 39.0–52.0)
HCT: 27 % — ABNORMAL LOW (ref 39.0–52.0)
HCT: 28 % — ABNORMAL LOW (ref 39.0–52.0)
HCT: 29 % — ABNORMAL LOW (ref 39.0–52.0)
Hemoglobin: 10.9 g/dL — ABNORMAL LOW (ref 13.0–17.0)
Hemoglobin: 9.9 g/dL — ABNORMAL LOW (ref 13.0–17.0)
Hemoglobin: 9.5 g/dL — ABNORMAL LOW (ref 13.0–17.0)
Hemoglobin: 8.8 g/dL — ABNORMAL LOW (ref 13.0–17.0)
Hemoglobin: 9.2 g/dL — ABNORMAL LOW (ref 13.0–17.0)
Hemoglobin: 9.5 g/dL — ABNORMAL LOW (ref 13.0–17.0)
Hemoglobin: 9.9 g/dL — ABNORMAL LOW (ref 13.0–17.0)
Potassium: 4 mmol/L (ref 3.5–5.1)
Potassium: 4.1 mmol/L (ref 3.5–5.1)
Potassium: 4.2 mmol/L (ref 3.5–5.1)
Potassium: 5.3 mmol/L — ABNORMAL HIGH (ref 3.5–5.1)
Potassium: 4.1 mmol/L (ref 3.5–5.1)
Potassium: 4.9 mmol/L (ref 3.5–5.1)
Potassium: 4.3 mmol/L (ref 3.5–5.1)
Sodium: 139 mmol/L (ref 135–145)
Sodium: 138 mmol/L (ref 135–145)
Sodium: 139 mmol/L (ref 135–145)
Sodium: 133 mmol/L — ABNORMAL LOW (ref 135–145)
Sodium: 135 mmol/L (ref 135–145)
Sodium: 137 mmol/L (ref 135–145)
Sodium: 134 mmol/L — ABNORMAL LOW (ref 135–145)
TCO2: 26 mmol/L (ref 22–32)
TCO2: 25 mmol/L (ref 22–32)
TCO2: 29 mmol/L (ref 22–32)
TCO2: 26 mmol/L (ref 22–32)
TCO2: 26 mmol/L (ref 22–32)
TCO2: 26 mmol/L (ref 22–32)
TCO2: 24 mmol/L (ref 22–32)

## 2017-10-04 LAB — BASIC METABOLIC PANEL
Anion gap: 6 (ref 5–15)
BUN: 13 mg/dL (ref 8–23)
CO2: 25 mmol/L (ref 22–32)
Calcium: 8.4 mg/dL — ABNORMAL LOW (ref 8.9–10.3)
Chloride: 105 mmol/L (ref 98–111)
Creatinine, Ser: 0.63 mg/dL (ref 0.61–1.24)
GFR calc Af Amer: >60 mL/min (ref >60)
GFR calc non Af Amer: >60 mL/min (ref >60)
Glucose, Bld: 117 mg/dL — ABNORMAL HIGH (ref 70–99)
Potassium: 4.1 mmol/L (ref 3.5–5.1)
Sodium: 136 mmol/L (ref 135–145)

## 2017-10-04 LAB — CBC
HCT: 30.9 % — ABNORMAL LOW (ref 39.0–52.0)
HCT: 31.8 % — ABNORMAL LOW (ref 39.0–52.0)
Hemoglobin: 10.1 g/dL — ABNORMAL LOW (ref 13.0–17.0)
Hemoglobin: 10.2 g/dL — ABNORMAL LOW (ref 13.0–17.0)
MCH: 29.4 pg (ref 26.0–34.0)
MCH: 29.5 pg (ref 26.0–34.0)
MCHC: 32.7 g/dL (ref 30.0–36.0)
MCHC: 32.1 g/dL (ref 30.0–36.0)
MCV: 89.8 fL (ref 78.0–100.0)
MCV: 91.9 fL (ref 78.0–100.0)
Platelets: 177 10*3/uL (ref 150–400)
Platelets: 169 10*3/uL (ref 150–400)
RBC: 3.44 MIL/uL — ABNORMAL LOW (ref 4.22–5.81)
RBC: 3.46 MIL/uL — ABNORMAL LOW (ref 4.22–5.81)
RDW: 12.6 % (ref 11.5–15.5)
RDW: 12.9 % (ref 11.5–15.5)
WBC: 10.2 10*3/uL (ref 4.0–10.5)
WBC: 11.3 10*3/uL — ABNORMAL HIGH (ref 4.0–10.5)

## 2017-10-04 LAB — GLUCOSE, CAPILLARY
Glucose-Capillary: 124 mg/dL — ABNORMAL HIGH (ref 70–99)
Glucose-Capillary: 124 mg/dL — ABNORMAL HIGH (ref 70–99)
Glucose-Capillary: 118 mg/dL — ABNORMAL HIGH (ref 70–99)
Glucose-Capillary: 111 mg/dL — ABNORMAL HIGH (ref 70–99)
Glucose-Capillary: 120 mg/dL — ABNORMAL HIGH (ref 70–99)
Glucose-Capillary: 103 mg/dL — ABNORMAL HIGH (ref 70–99)
Glucose-Capillary: 110 mg/dL — ABNORMAL HIGH (ref 70–99)
Glucose-Capillary: 117 mg/dL — ABNORMAL HIGH (ref 70–99)
Glucose-Capillary: 104 mg/dL — ABNORMAL HIGH (ref 70–99)
Glucose-Capillary: 107 mg/dL — ABNORMAL HIGH (ref 70–99)
Glucose-Capillary: 113 mg/dL — ABNORMAL HIGH (ref 70–99)
Glucose-Capillary: 132 mg/dL — ABNORMAL HIGH (ref 70–99)
Glucose-Capillary: 119 mg/dL — ABNORMAL HIGH (ref 70–99)
Glucose-Capillary: 136 mg/dL — ABNORMAL HIGH (ref 70–99)

## 2017-10-04 LAB — CREATININE, SERUM
Creatinine, Ser: 0.83 mg/dL (ref 0.61–1.24)
GFR calc Af Amer: >60 mL/min (ref >60)
GFR calc non Af Amer: >60 mL/min (ref >60)

## 2017-10-04 MED ORDER — INSULIN DETEMIR 100 UNIT/ML ~~LOC~~ SOLN
15.0000 [IU] | Freq: Once | SUBCUTANEOUS | Status: AC
  Administered 2017-10-04: 15 [IU] via SUBCUTANEOUS
  Filled 2017-10-04: qty 0.15

## 2017-10-04 MED ORDER — POTASSIUM CHLORIDE CRYS ER 20 MEQ PO TBCR
20.0000 meq | EXTENDED_RELEASE_TABLET | Freq: Two times a day (BID) | ORAL | Status: AC
  Administered 2017-10-04 (×2): 20 meq via ORAL
  Filled 2017-10-04 (×2): qty 1

## 2017-10-04 MED ORDER — ORAL CARE MOUTH RINSE
15.0000 mL | Freq: Two times a day (BID) | OROMUCOSAL | Status: DC
  Administered 2017-10-04 – 2017-10-07 (×3): 15 mL via OROMUCOSAL

## 2017-10-04 MED ORDER — INSULIN ASPART 100 UNIT/ML ~~LOC~~ SOLN
0.0000 [IU] | SUBCUTANEOUS | Status: DC
  Administered 2017-10-04: 2 [IU] via SUBCUTANEOUS

## 2017-10-04 MED ORDER — ENOXAPARIN SODIUM 40 MG/0.4ML ~~LOC~~ SOLN
40.0000 mg | Freq: Every day | SUBCUTANEOUS | Status: DC
  Administered 2017-10-04 – 2017-10-06 (×3): 40 mg via SUBCUTANEOUS
  Filled 2017-10-04 (×3): qty 0.4

## 2017-10-04 MED ORDER — FUROSEMIDE 10 MG/ML IJ SOLN
40.0000 mg | Freq: Once | INTRAMUSCULAR | Status: AC
  Administered 2017-10-04: 40 mg via INTRAVENOUS
  Filled 2017-10-04: qty 4

## 2017-10-04 NOTE — Progress Notes (Signed)
1 Day Post-Op Procedure(s) (LRB): CORONARY ARTERY BYPASS GRAFTING (CABG) x 4, USING LEFT INTERNAL MAMMARY ARTERY AND RIGHT GREATER SAPHENOUS VEIN HARVESTED ENDOSCOPICALLY. LIMA TO LAD, SVG TO DIAG, SVG TO OM, SVG TO PDA. (N/A) TRANSESOPHAGEAL ECHOCARDIOGRAM (TEE) (N/A) Subjective: No complaints  Objective: Vital signs in last 24 hours: Temp:  [95.7 F (35.4 C)-100.2 F (37.9 C)] 99.7 F (37.6 C) (09/06 0700) Pulse Rate:  [71-92] 78 (09/06 0700) Cardiac Rhythm: Normal sinus rhythm (09/06 0000) Resp:  [9-17] 12 (09/06 0700) BP: (85-113)/(51-68) 94/62 (09/06 0700) SpO2:  [97 %-100 %] 98 % (09/06 0700) FiO2 (%):  [40 %-50 %] 40 % (09/05 1849) Weight:  [87.4 kg] 87.4 kg (09/06 0500)  Hemodynamic parameters for last 24 hours: PAP: (17-33)/(6-20) 20/8 CO:  [4 L/min-6.6 L/min] 4.5 L/min CI:  [1.9 L/min/m2-3.2 L/min/m2] 2.2 L/min/m2  Intake/Output from previous day: 09/05 0701 - 09/06 0700 In: 5291.3 [P.O.:180; I.V.:3691.4; Blood:200; IV Piggyback:1219.9] Out: 4610 [Urine:3510; Emesis/NG output:300; Blood:400; Chest Tube:400] Intake/Output this shift: No intake/output data recorded.  General appearance: alert and cooperative Neurologic: intact Heart: regular rate and rhythm and rub from tubes Lungs: clear to auscultation bilaterally Extremities: edema mild Wound: dressings dry  Lab Results: Recent Labs    10/03/17 2038 10/03/17 2040 10/04/17 0435  WBC 10.7*  --  10.2  HGB 10.3* 9.9* 10.1*  HCT 31.3* 29.0* 30.9*  PLT 180  --  177   BMET:  Recent Labs    10/03/17 0326  10/03/17 2040 10/04/17 0435  NA 138   < > 136 136  K 3.6   < > 4.2 4.1  CL 103  --  106 105  CO2 25  --   --  25  GLUCOSE 127*   < > 122* 117*  BUN 13  --  12 13  CREATININE 0.85   < > 0.70 0.63  CALCIUM 9.0  --   --  8.4*   < > = values in this interval not displayed.    PT/INR:  Recent Labs    10/03/17 1328  LABPROT 15.3*  INR 1.22   ABG    Component Value Date/Time   PHART 7.319 (L)  10/03/2017 2036   HCO3 25.2 10/03/2017 2036   TCO2 25 10/03/2017 2040   ACIDBASEDEF 1.0 10/03/2017 2036   O2SAT 97.0 10/03/2017 2036   CBG (last 3)  Recent Labs    10/04/17 0249 10/04/17 0418 10/04/17 0529  GLUCAP 118* 111* 120*   CXR: clear  ECG: sinus, mild diffuse ST elevation most likely due to pericarditis.  Assessment/Plan: S/P Procedure(s) (LRB): CORONARY ARTERY BYPASS GRAFTING (CABG) x 4, USING LEFT INTERNAL MAMMARY ARTERY AND RIGHT GREATER SAPHENOUS VEIN HARVESTED ENDOSCOPICALLY. LIMA TO LAD, SVG TO DIAG, SVG TO OM, SVG TO PDA. (N/A) TRANSESOPHAGEAL ECHOCARDIOGRAM (TEE) (N/A)  POD 1 Hemodynamically stable in sinus rhythm. Continue low dose Lopressor. DM: preop Hgb A1c 5.7. Still on insulin drip so will give him a dose of Levemir this am and continue SSI for now. Volume excess: weight is 6 lbs over preop. Will diurese some. DC chest tubes, swan , arterial line Mobilize, IS.   LOS: 3 days    Gaye Pollack 10/04/2017

## 2017-10-04 NOTE — Plan of Care (Signed)
  Problem: Clinical Measurements: Goal: Ability to maintain clinical measurements within normal limits will improve Outcome: Progressing Goal: Respiratory complications will improve Outcome: Progressing Goal: Cardiovascular complication will be avoided Outcome: Progressing   Problem: Activity: Goal: Risk for activity intolerance will decrease Outcome: Progressing   Problem: Pain Managment: Goal: General experience of comfort will improve Outcome: Progressing   Problem: Cardiac: Goal: Will achieve and/or maintain hemodynamic stability Outcome: Progressing   Problem: Clinical Measurements: Goal: Postoperative complications will be avoided or minimized Outcome: Progressing   Problem: Urinary Elimination: Goal: Ability to achieve and maintain adequate renal perfusion and functioning will improve Outcome: Progressing

## 2017-10-04 NOTE — Plan of Care (Signed)
  Problem: Respiratory: Goal: Respiratory status will improve Outcome: Progressing  Pt extubated to 2 liters Problem: Cardiac: Goal: Will achieve and/or maintain hemodynamic stability Outcome: Progressing  CO/CI WNL no cardiac gtts Problem: Urinary Elimination: Goal: Ability to achieve and maintain adequate renal perfusion and functioning will improve Outcome: Progressing  Pt with adequate UOP Problem: Pain Management: Goal: General experience of comfort will improve Outcome: Progressing  Pt with minimal complaints of pain. PO tylenol started

## 2017-10-04 NOTE — Progress Notes (Signed)
Patient ID: Jeff Decker, male   DOB: [birth year], 72 y.o.   MRN: [id number] [facility] Evening Rounds:  Hemodynamically stable. Ambulated today Urine output good  BMET    Component Value Date/Time   NA 134 (L) 10/04/2017 1526   NA 139 09/25/2017 0909   NA 139 11/20/2016 0800   K 4.3 10/04/2017 1526   K 4.2 11/20/2016 0800   CL 99 10/04/2017 1526   CO2 25 10/04/2017 0435   CO2 24 11/20/2016 0800   GLUCOSE 137 (H) 10/04/2017 1526   GLUCOSE 110 11/20/2016 0800   BUN 15 10/04/2017 1526   BUN 14 09/25/2017 0909   BUN 16.7 11/20/2016 0800   CREATININE 0.80 10/04/2017 1526   CREATININE 0.87 08/15/2017 0807   CREATININE 0.8 11/20/2016 0800   CALCIUM 8.4 (L) 10/04/2017 0435   CALCIUM 9.1 11/20/2016 0800   GFRNONAA >60 10/04/2017 1522   GFRNONAA >60 08/15/2017 0807   GFRAA >60 10/04/2017 1522   GFRAA >60 08/15/2017 0807   CBC    Component Value Date/Time   WBC 11.3 (H) 10/04/2017 1522   RBC 3.46 (L) 10/04/2017 1522   HGB 9.9 (L) 10/04/2017 1526   HGB 12.6 (L) 09/25/2017 0909   HGB 12.6 (L) 11/20/2016 0800   HCT 29.0 (L) 10/04/2017 1526   HCT 36.2 (L) 09/25/2017 0909   HCT 37.6 (L) 11/20/2016 0800   PLT 169 10/04/2017 1522   PLT 282 09/25/2017 0909   MCV 91.9 10/04/2017 1522   MCV 85 09/25/2017 0909   MCV 89.2 11/20/2016 0800   MCH 29.5 10/04/2017 1522   MCHC 32.1 10/04/2017 1522   RDW 12.9 10/04/2017 1522   RDW 13.8 09/25/2017 0909   RDW 13.7 11/20/2016 0800   LYMPHSABS 2.1 08/15/2017 0807   LYMPHSABS 1.4 11/20/2016 0800   MONOABS 0.7 08/15/2017 0807   MONOABS 0.4 11/20/2016 0800   EOSABS 0.2 08/15/2017 0807   EOSABS 0.1 11/20/2016 0800   BASOSABS 0.1 08/15/2017 0807   BASOSABS 0.0 11/20/2016 0800

## 2017-10-04 NOTE — Progress Notes (Signed)
EKG CRITICAL VALUE     12 lead EKG performed.  Critical value noted.  Gwyndolyn Saxon, RN notified.   Genia Plants, CCT 10/04/2017 7:29 AM

## 2017-10-04 NOTE — Discharge Summary (Signed)
Physician Discharge Summary       [address]       [place] [address]             [phone]    Patient ID: Jeff Decker MRN: [id number] DOB/AGE: Feb 07, 1945 72 y.o.  Admit date: 10/01/2017 Discharge date: 10/07/2017  Admission Diagnoses: 1. Unstable angina ([place]) 2. Coronary artery disease  Discharge Diagnoses:  1. S/p CABG x 4 2. ABL anemia 3. Pre diabetes-HGA1C 5.7 4. History of Myocardial infarction (HCC)-1989 5. History of mixed hyperlipidemia 6. History of GERD (gastroesophageal reflux disease) 7. History of remote tobacco abuse 8. History of Cancer (HCC)-prostate and bone , chemo completed 9. History of arthritis   Procedure (s):  Martinique, Peter M, MD (Primary)    Procedures   LEFT HEART CATH AND CORONARY ANGIOGRAPHY on 10/01/2017:  Conclusion     Prox LAD lesion is 75% stenosed.  Ost LAD to Prox LAD lesion is 90% stenosed.  Prox LAD to Mid LAD lesion is 90% stenosed.  Dist LM to Ost LAD lesion is 50% stenosed.  Mid RCA lesion is 60% stenosed.  Ost RPDA to RPDA lesion is 99% stenosed.  There is moderate left ventricular systolic dysfunction.  LV end diastolic pressure is normal.  The left ventricular ejection fraction is 35-45% by visual estimate.   1. Severe 2 vessel and left main CAD.     - 50% distal left main    - 90% ostial and mid LAD, diffuse 75% in stent restenosis    - 99% PDA 2. Moderate LV dysfunction 3. Normal LVEDP  Plan: Recommend CT surgery consult for CABG.     1. Median Sternotomy 2. Extracorporeal circulation 3.   Coronary artery bypass grafting x 4   Left internal mammary graft to the LAD  SVG to diagonal  SVG to distal LCX  SVG to PDA 4.   Endoscopic vein harvest from the right leg by Dr. 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  The patient reports being in his usual state of health until the past 3 weeks when he began having episodes of substernal chest discomfort and shortness of breath with exertion.  His last episode was yesterday.  He reports a normal energy level and said that a few weeks ago he completed a 5600 mile motorcycle trip to [place].  Cardiac catheterization today showed a 50% distal left main stenosis extending into the ostium of the LAD with significant calcification.  The ostial to proximal LAD had 90% stenosis.  The right coronary artery had 60% mid vessel stenosis and 99% stenosis at the ostium of the PDA branch.  The left circumflex was a large dominant vessel with no significant stenoses but compromised by the distal left main stenosis.  Left ventricular end-diastolic pressure is normal.  Left ventricular ejection fraction was reduced to 35 to 45%. This 72 year old gentleman has left main and severe multivessel coronary disease with accelerating anginal symptoms.  He has moderate left ventricular systolic dysfunction.  Dr. Cyndia Bent agreed that coronary bypass graft surgery is the best treatment to prevent further ischemia and infarction and improve his quality of life.  Dr. Cyndia Bent reviewed the cardiac catheterization findings with the patient and his family and answered their questions.  Dr. Cyndia Bent discussed the operative procedure  with the patient and family including alternatives, benefits and risks. Patient agreed to proceed with surgery. Pre operative carotid duplex showed no significant internal carotid artery stenosis bilaterally. He underwent a CABG x 4 on 10/03/2017.   Brief Hospital Course:  The patient was extubated the evening of surgery without difficulty. He remained afebrile and hemodynamically stable. EKG showed mild, diffuse ST elevation most likely due to pericarditis. Jeff Decker, a line, chest tubes, and foley were removed  early in the post operative course. Lopressor was started and titrated accordingly. He was volume over loaded and diuresed. He had ABL anemia. He did not require a post op transfusion. Last H and H was 9.1 and 28.2. He was weaned off the insulin drip. The patient's HGA1C pre op was 5.7.  He likely has pre diabetes and will need further surveillance with his medical doctor as an outpatient. Nutrition recommendations will be included with is discharge paperwork. The patient was felt surgically stable for transfer from the ICU to [facility] for further convalescence on 10/05/2017. He continues to progress with cardiac rehab. He was ambulating on room air. He has been tolerating a diet and has had a bowel movement. Epicardial pacing wires were removed on 10/06/2017. Chest tube sutures will be removed in the office after discharge. The patient is felt surgically stable for discharge today.   Latest Vital Signs: Blood pressure 113/65, pulse (!) 53, temperature 97.8 F (36.6 C), temperature source Oral, resp. rate 14, height 5\' 11"  (1.803 m), weight 82.6 kg, SpO2 95 %.  Physical Exam: General appearance: alert, cooperative and no distress Heart: regular rate and rhythm, S1, S2 normal, no murmur, click, rub or gallop Lungs: clear to auscultation bilaterally Abdomen: soft, non-tender; bowel sounds normal; no masses,  no organomegaly Extremities: extremities normal, atraumatic, no cyanosis or edema Wound: clean and dry   Discharge Condition:Stable and discharged to home.  Recent laboratory studies:  Lab Results  Component Value Date   WBC 10.8 (H) 10/05/2017   HGB 9.1 (L) 10/05/2017   HCT 28.2 (L) 10/05/2017   MCV 91.6 10/05/2017   PLT 145 (L) 10/05/2017   Lab Results  Component Value Date   NA 135 10/06/2017   K 4.2 10/06/2017   CL 96 (L) 10/06/2017   CO2 30 10/06/2017   CREATININE 0.83 10/06/2017   GLUCOSE 132 (H) 10/06/2017      Diagnostic Studies: Dg Chest 2 View  Result Date:  10/02/2017 CLINICAL DATA:  Preoperative chest x-ray. History of coronary artery disease and previous MI, gastroesophageal reflux, former smoker. No current complaints. EXAM: CHEST - 2 VIEW COMPARISON:  Chest x-ray of February 12, 2005 FINDINGS: The lungs are adequately inflated and clear. There is no pleural effusion. The heart and pulmonary vascularity are normal. There is calcification in the wall of the aortic arch. Coronary artery calcifications are visible. The bony thorax is unremarkable. IMPRESSION: Coronary artery calcifications and thoracic aortic calcification consistent with the patient's history of coronary artery disease. No pneumonia, CHF, nor other acute cardiopulmonary abnormality. Electronically Signed   By: David  Martinique M.D.   On: 10/02/2017 13:49   Dg Chest Port 1 View  Result Date: 10/05/2017 CLINICAL DATA:  CABG. EXAM: PORTABLE CHEST 1 VIEW COMPARISON:  Chest x-ray from yesterday. FINDINGS: Interval removal of the Swan-Ganz catheter. Unchanged right internal jugular sheath. Interval removal of the mediastinal and left-sided chest tubes. Stable cardiomediastinal silhouette status post CABG. Normal pulmonary vascularity. Unchanged small bilateral pleural effusions. Retrocardiac density appears slightly increased. Unchanged right  basilar atelectasis. No pneumothorax. No acute osseous abnormality. IMPRESSION: 1. Slightly increased retrocardiac atelectasis versus infiltrate. 2. Unchanged small bilateral pleural effusions. Electronically Signed   By: Titus Dubin M.D.   On: 10/05/2017 07:54   Dg Chest Port 1 View  Result Date: 10/04/2017 CLINICAL DATA:  Status post CABG yesterday. EXAM: PORTABLE CHEST 1 VIEW COMPARISON:  Portable chest x-ray of October 03, 2017 FINDINGS: The lungs are mildly hypoinflated. The interstitial markings are mildly increased but stable. There is no pneumothorax. A small amount of pleural fluid blunts the costophrenic angles. A mediastinal drain and 2 left-sided  chest tubes are in stable position. The Swan-Ganz catheter tip projects at the proximal right main pulmonary artery. The sternal wires are intact. The trachea and esophagus have been extubated. There is mild gaseous distention of the stomach. IMPRESSION: Fairly stable appearance of the chest. Mild hypoinflation. Low-grade CHF with trace bilateral pleural effusions. No pneumothorax nor pneumonia. The remaining support tubes are in reasonable position. Electronically Signed   By: David  Martinique M.D.   On: 10/04/2017 09:38   Dg Chest Port 1 View  Result Date: 10/03/2017 CLINICAL DATA:  72 y/o  M; status post CABG. EXAM: PORTABLE CHEST 1 VIEW COMPARISON:  10/02/2017 chest radiograph FINDINGS: Endotracheal tube tip projects 3.6 cm above the carina. Enteric tube tip extends into the stomach. Multiple mediastinal/chest tubes noted. Swan catheter tip projects over left mediastinum. Pulmonary vascular congestion and small left effusion. No pneumothorax. Normal cardiac silhouette given projection and technique. Status post CABG and median sternotomy. IMPRESSION: 1. Endotracheal tube tip projects 3.6 cm above the carina. Enteric tube tip in stomach. 2. Pulmonary vascular congestion and small left effusion. Electronically Signed   By: Kristine Garbe M.D.   On: 10/03/2017 14:12         Discharge Medications: Allergies as of 10/07/2017      Reactions   Statins Other (See Comments)   Muscle pain Tried 3 different statins      Medication List    STOP taking these medications   Fish Oil 1200 MG Caps   nitroGLYCERIN 0.4 MG SL tablet Commonly known as:  NITROSTAT     TAKE these medications   acetaminophen 325 MG tablet Commonly known as:  TYLENOL Take 2 tablets (650 mg total) by mouth every 6 (six) hours as needed for mild pain.   aspirin EC 81 MG tablet Take 81 mg by mouth daily.   CALCIUM 600+D 600-800 MG-UNIT Tabs Generic drug:  Calcium Carb-Cholecalciferol Take 1 tablet by mouth daily.    fexofenadine 180 MG tablet Commonly known as:  ALLEGRA Take 180 mg by mouth daily.   glucosamine-chondroitin 500-400 MG tablet Take 1 tablet by mouth daily.   metoprolol tartrate 25 MG tablet Commonly known as:  LOPRESSOR Take 0.5 tablets (12.5 mg total) by mouth 2 (two) times daily.   MULTI-VITAMINS Tabs Take by mouth.   omeprazole 20 MG capsule Commonly known as:  PRILOSEC Take 20 mg by mouth daily before breakfast.   oxyCODONE 5 MG immediate release tablet Commonly known as:  Oxy IR/ROXICODONE Take 1 tablet (5 mg total) by mouth every 6 (six) hours as needed for severe pain.      The patient has been discharged on:   1.Beta Blocker:  Yes [ x  ]                              No   [   ]  If No, reason:  2.Ace Inhibitor/ARB: Yes [   ]                                     No  [  x  ]                                     If No, reason: normotensive  3.Statin:   Yes [   ]                  No  [  x ]                  If No, reason:Allergy  4.Shela Commons:  Yes  [ x  ]                  No   [   ]                  If No, reason:  Follow Up Appointments: Follow-up Information    Gaye Pollack, MD Follow up on 10/04/2017.   Specialty:  Cardiothoracic Surgery Why:  PA/LAT CXR to be taken (at [facility] which is in the same building as Dr. Vivi Martens office) on at;Appointment time is at  Contact information: [place] [address] [phone]        Nickola Major, MD. Call.   Specialty:  Family Medicine Why:  for a follow up appointment regarding furhter surveillance of HGA1C 5.7 (pre diabetes) Contact information: [address] [place] [address] [phone]        Burtis Junes, NP. Go on 10/22/2017.   Specialties:  Nurse Practitioner, Interventional Cardiology, Cardiology, Radiology Why:  Appointment time is at 11:00 am  Contact information: [facility]  [place] [address] [phone]        nursing suture removal appointment Follow up.   Why:  PLease arrive at 10:00am for your suture removal appointment on 10/14/2017.  Contact information: Dr. Vivi Martens office          Signed: Terance Hart ContePA-C 10/07/2017, 8:13 AM

## 2017-10-05 ENCOUNTER — Inpatient Hospital Stay (HOSPITAL_COMMUNITY): Payer: Medicare Other

## 2017-10-05 LAB — CBC
HCT: 28.2 % — ABNORMAL LOW (ref 39.0–52.0)
Hemoglobin: 9.1 g/dL — ABNORMAL LOW (ref 13.0–17.0)
MCH: 29.5 pg (ref 26.0–34.0)
MCHC: 32.3 g/dL (ref 30.0–36.0)
MCV: 91.6 fL (ref 78.0–100.0)
Platelets: 145 10*3/uL — ABNORMAL LOW (ref 150–400)
RBC: 3.08 MIL/uL — ABNORMAL LOW (ref 4.22–5.81)
RDW: 12.8 % (ref 11.5–15.5)
WBC: 10.8 10*3/uL — ABNORMAL HIGH (ref 4.0–10.5)

## 2017-10-05 LAB — BASIC METABOLIC PANEL
Anion gap: 7 (ref 5–15)
BUN: 11 mg/dL (ref 8–23)
CO2: 27 mmol/L (ref 22–32)
Calcium: 8.2 mg/dL — ABNORMAL LOW (ref 8.9–10.3)
Chloride: 99 mmol/L (ref 98–111)
Creatinine, Ser: 0.69 mg/dL (ref 0.61–1.24)
GFR calc Af Amer: >60 mL/min (ref >60)
GFR calc non Af Amer: >60 mL/min (ref >60)
Glucose, Bld: 128 mg/dL — ABNORMAL HIGH (ref 70–99)
Potassium: 4.1 mmol/L (ref 3.5–5.1)
Sodium: 133 mmol/L — ABNORMAL LOW (ref 135–145)

## 2017-10-05 LAB — GLUCOSE, CAPILLARY: Glucose-Capillary: 122 mg/dL — ABNORMAL HIGH (ref 70–99)

## 2017-10-05 MED ORDER — TRAMADOL HCL 50 MG PO TABS
50.0000 mg | ORAL_TABLET | ORAL | Status: DC | PRN
  Administered 2017-10-06 (×3): 50 mg via ORAL
  Filled 2017-10-05 (×3): qty 1

## 2017-10-05 MED ORDER — BISACODYL 5 MG PO TBEC: 10.0000 mg | DELAYED_RELEASE_TABLET | Freq: Every day | ORAL | Status: DC | PRN

## 2017-10-05 MED ORDER — ACETAMINOPHEN 325 MG PO TABS
650.0000 mg | ORAL_TABLET | Freq: Four times a day (QID) | ORAL | Status: DC | PRN
  Administered 2017-10-07: 650 mg via ORAL
  Filled 2017-10-05: qty 2

## 2017-10-05 MED ORDER — POTASSIUM CHLORIDE CRYS ER 20 MEQ PO TBCR
20.0000 meq | EXTENDED_RELEASE_TABLET | Freq: Two times a day (BID) | ORAL | Status: DC
  Administered 2017-10-05 – 2017-10-07 (×5): 20 meq via ORAL
  Filled 2017-10-05 (×5): qty 1

## 2017-10-05 MED ORDER — SODIUM CHLORIDE 0.9% FLUSH: 3.0000 mL | INTRAVENOUS | Status: DC | PRN

## 2017-10-05 MED ORDER — METOPROLOL TARTRATE 12.5 MG HALF TABLET
12.5000 mg | ORAL_TABLET | Freq: Two times a day (BID) | ORAL | Status: DC
  Administered 2017-10-05 – 2017-10-07 (×4): 12.5 mg via ORAL
  Filled 2017-10-05 (×4): qty 1

## 2017-10-05 MED ORDER — SODIUM CHLORIDE 0.9% FLUSH
3.0000 mL | Freq: Two times a day (BID) | INTRAVENOUS | Status: DC
  Administered 2017-10-05 – 2017-10-06 (×3): 3 mL via INTRAVENOUS

## 2017-10-05 MED ORDER — BISACODYL 10 MG RE SUPP: 10.0000 mg | Freq: Every day | RECTAL | Status: DC | PRN

## 2017-10-05 MED ORDER — ONDANSETRON HCL 4 MG/2ML IJ SOLN: 4.0000 mg | Freq: Four times a day (QID) | INTRAMUSCULAR | Status: DC | PRN

## 2017-10-05 MED ORDER — ONDANSETRON HCL 4 MG PO TABS: 4.0000 mg | ORAL_TABLET | Freq: Four times a day (QID) | ORAL | Status: DC | PRN

## 2017-10-05 MED ORDER — MOVING RIGHT ALONG BOOK
Freq: Once | Status: AC
  Administered 2017-10-05: 19:00:00
  Filled 2017-10-05: qty 1

## 2017-10-05 MED ORDER — OXYCODONE HCL 5 MG PO TABS
5.0000 mg | ORAL_TABLET | ORAL | Status: DC | PRN
  Administered 2017-10-05: 10 mg via ORAL
  Administered 2017-10-06 (×4): 5 mg via ORAL
  Filled 2017-10-05 (×3): qty 1
  Filled 2017-10-05: qty 2
  Filled 2017-10-05: qty 1

## 2017-10-05 MED ORDER — SODIUM CHLORIDE 0.9 % IV SOLN: 250.0000 mL | INTRAVENOUS | Status: DC | PRN

## 2017-10-05 MED ORDER — FUROSEMIDE 40 MG PO TABS
40.0000 mg | ORAL_TABLET | Freq: Every day | ORAL | Status: DC
  Administered 2017-10-05 – 2017-10-06 (×2): 40 mg via ORAL
  Filled 2017-10-05 (×2): qty 1

## 2017-10-05 MED ORDER — METOLAZONE 5 MG PO TABS
2.5000 mg | ORAL_TABLET | Freq: Every day | ORAL | Status: AC
  Administered 2017-10-05 – 2017-10-06 (×2): 2.5 mg via ORAL
  Filled 2017-10-05 (×2): qty 1

## 2017-10-05 MED ORDER — PANTOPRAZOLE SODIUM 40 MG PO TBEC
40.0000 mg | DELAYED_RELEASE_TABLET | Freq: Every day | ORAL | Status: DC
  Administered 2017-10-06 – 2017-10-07 (×2): 40 mg via ORAL
  Filled 2017-10-05 (×2): qty 1

## 2017-10-05 MED ORDER — DOCUSATE SODIUM 100 MG PO CAPS
200.0000 mg | ORAL_CAPSULE | Freq: Every day | ORAL | Status: DC
  Administered 2017-10-07: 200 mg via ORAL
  Filled 2017-10-05: qty 2

## 2017-10-05 NOTE — Progress Notes (Addendum)
2 Days Post-Op Procedure(s) (LRB): CORONARY ARTERY BYPASS GRAFTING (CABG) x 4, USING LEFT INTERNAL MAMMARY ARTERY AND RIGHT GREATER SAPHENOUS VEIN HARVESTED ENDOSCOPICALLY. LIMA TO LAD, SVG TO DIAG, SVG TO OM, SVG TO PDA. (N/A) TRANSESOPHAGEAL ECHOCARDIOGRAM (TEE) (N/A) Subjective: No complaints  Objective: Vital signs in last 24 hours: Temp:  [97.9 F (36.6 C)-99.5 F (37.5 C)] 99 F (37.2 C) (09/07 0400) Pulse Rate:  [69-83] 76 (09/07 0700) Cardiac Rhythm: Normal sinus rhythm (09/07 0400) Resp:  [11-22] 15 (09/07 0700) BP: (95-119)/(51-70) 101/52 (09/07 0700) SpO2:  [90 %-99 %] 95 % (09/07 0700) Weight:  [88 kg] 88 kg (09/07 0500)  Hemodynamic parameters for last 24 hours: PAP: (23)/(9) 23/9 CO:  [5.4 L/min] 5.4 L/min CI:  [2.6 L/min/m2] 2.6 L/min/m2  Intake/Output from previous day: 09/06 0701 - 09/07 0700 In: 1669.4 [P.O.:960; I.V.:509.3; IV Piggyback:200.1] Out: 1835 [IWPYK:9983; Chest Tube:100] Intake/Output this shift: No intake/output data recorded.  General appearance: alert and cooperative Neurologic: intact Heart: regular rate and rhythm, S1, S2 normal, no murmur, click, rub or gallop Lungs: clear to auscultation bilaterally Extremities: edema mild Wound: dressings dry  Lab Results: Recent Labs    10/04/17 1522 10/04/17 1526 10/05/17 0217  WBC 11.3*  --  10.8*  HGB 10.2* 9.9* 9.1*  HCT 31.8* 29.0* 28.2*  PLT 169  --  145*   BMET:  Recent Labs    10/04/17 0435  10/04/17 1526 10/05/17 0217  NA 136  --  134* 133*  K 4.1  --  4.3 4.1  CL 105  --  99 99  CO2 25  --   --  27  GLUCOSE 117*  --  137* 128*  BUN 13  --  15 11  CREATININE 0.63   < > 0.80 0.69  CALCIUM 8.4*  --   --  8.2*   < > = values in this interval not displayed.    PT/INR:  Recent Labs    10/03/17 1328  LABPROT 15.3*  INR 1.22   ABG    Component Value Date/Time   PHART 7.319 (L) 10/03/2017 2036   HCO3 25.2 10/03/2017 2036   TCO2 24 10/04/2017 1526   ACIDBASEDEF 1.0  10/03/2017 2036   O2SAT 97.0 10/03/2017 2036   CBG (last 3)  Recent Labs    10/04/17 1312 10/04/17 1520 10/05/17 0602  GLUCAP 119* 136* 122*   ECG: sinus, non-specific ST changes  Assessment/Plan: S/P Procedure(s) (LRB): CORONARY ARTERY BYPASS GRAFTING (CABG) x 4, USING LEFT INTERNAL MAMMARY ARTERY AND RIGHT GREATER SAPHENOUS VEIN HARVESTED ENDOSCOPICALLY. LIMA TO LAD, SVG TO DIAG, SVG TO OM, SVG TO PDA. (N/A) TRANSESOPHAGEAL ECHOCARDIOGRAM (TEE) (N/A)  POD 2 Hemodynamically stable in sinus rhythm. Continue low dose Lopressor.  Mild volume excess: weight is 8 lbs over preop. Continue diuretic and KCL.  DC foley and sleeve  Continue IS and ambulation  Hyperlipidemia: statin intolerant. Cardiology will need to consider alternative therapies given severity of coronary disease.  DM: glucose under good control and HgbA1c was 5.7 preop so will stop CBG's. Needs dietary education.  Transfer to 4E.    LOS: 4 days    Gaye Pollack 10/05/2017

## 2017-10-05 NOTE — Discharge Instructions (Signed)
Prediabetes Eating Plan Prediabetes--also called impaired glucose tolerance or impaired fasting glucose--is a condition that causes blood sugar (blood glucose) levels to be higher than normal. Following a healthy diet can help to keep prediabetes under control. It can also help to lower the risk of type 2 diabetes and heart disease, which are increased in people who have prediabetes. Along with regular exercise, a healthy diet:  Promotes weight loss.  Helps to control blood sugar levels.  Helps to improve the way that the body uses insulin.  What do I need to know about this eating plan?  Use the glycemic index (GI) to plan your meals. The index tells you how quickly a food will raise your blood sugar. Choose low-GI foods. These foods take a longer time to raise blood sugar.  Pay close attention to the amount of carbohydrates in the food that you eat. Carbohydrates increase blood sugar levels.  Keep track of how many calories you take in. Eating the right amount of calories will help you to achieve a healthy weight. Losing about 7 percent of your starting weight can help to prevent type 2 diabetes.  You may want to follow a Mediterranean diet. This diet includes a lot of vegetables, lean meats or fish, whole grains, fruits, and healthy oils and fats. What foods can I eat? Grains Whole grains, such as whole-wheat or whole-grain breads, crackers, cereals, and pasta. Unsweetened oatmeal. Bulgur. Barley. Quinoa. Brown rice. Corn or whole-wheat flour tortillas or taco shells. Vegetables Lettuce. Spinach. Peas. Beets. Cauliflower. Cabbage. Broccoli. Carrots. Tomatoes. Squash. Eggplant. Herbs. Peppers. Onions. Cucumbers. Brussels sprouts. Fruits Berries. Bananas. Apples. Oranges. Grapes. Papaya. Mango. Pomegranate. Kiwi. Grapefruit. Cherries. Meats and Other Protein Sources Seafood. Lean meats, such as chicken and [place] or lean cuts of pork and beef. Tofu. Eggs. Nuts. Beans. Dairy Low-fat or  fat-free dairy products, such as yogurt, cottage cheese, and cheese. Beverages Water. Tea. Coffee. Sugar-free or diet soda. Seltzer water. Milk. Milk alternatives, such as soy or almond milk. Condiments Mustard. Relish. Low-fat, low-sugar ketchup. Low-fat, low-sugar barbecue sauce. Low-fat or fat-free mayonnaise. Sweets and Desserts Sugar-free or low-fat pudding. Sugar-free or low-fat ice cream and other frozen treats. Fats and Oils Avocado. Walnuts. Olive oil. The items listed above may not be a complete list of recommended foods or beverages. Contact your dietitian for more options. What foods are not recommended? Grains Refined white flour and flour products, such as bread, pasta, snack foods, and cereals. Beverages Sweetened drinks, such as sweet iced tea and soda. Sweets and Desserts Baked goods, such as cake, cupcakes, pastries, cookies, and cheesecake. The items listed above may not be a complete list of foods and beverages to avoid. Contact your dietitian for more information. This information is not intended to replace advice given to you by your health care provider. Make sure you discuss any questions you have with your health care provider. Document Released: 06/01/2014 Document Revised: 06/23/2015 Document Reviewed: 02/10/2014 Elsevier Interactive Patient Education  2017 [employer]   Coronary Artery Bypass Grafting, Care After These instructions give you information on caring for yourself after your procedure. Your doctor may also give you more specific instructions. Call your doctor if you have any problems or questions after your procedure. Follow these instructions at home:  Only take medicine as told by your doctor. Take medicines exactly as told. Do not stop taking medicines or start any new medicines without talking to your doctor first.  Take your pulse as told by your doctor.  Do  deep breathing as told by your doctor. Use your breathing device (incentive  spirometer), if given, to practice deep breathing several times a day. Support your chest with a pillow or your arms when you take deep breaths or cough.  Keep the area clean, dry, and protected where the surgery cuts (incisions) were made. Remove bandages (dressings) only as told by your doctor. If strips were applied to surgical area, do not take them off. They fall off on their own.  Check the surgery area daily for puffiness (swelling), redness, or leaking fluid.  If surgery cuts were made in your legs: ? Avoid crossing your legs. ? Avoid sitting for long periods of time. Change positions every 30 minutes. ? Raise your legs when you are sitting. Place them on pillows.  Wear stockings that help keep blood clots from forming in your legs (compression stockings).  Only take sponge baths until your doctor says it is okay to take showers. Pat the surgery area dry. Do not rub the surgery area with a washcloth or towel. Do not bathe, swim, or use a hot tub until your doctor says it is okay.  Eat foods that are high in fiber. These include raw fruits and vegetables, whole grains, beans, and nuts. Choose lean meats. Avoid canned, processed, and fried foods.  Drink enough fluids to keep your pee (urine) clear or pale yellow.  Weigh yourself every day.  Rest and limit activity as told by your doctor. You may be told to: ? Stop any activity if you have chest pain, shortness of breath, changes in heartbeat, or dizziness. Get help right away if this happens. ? Move around often for short amounts of time or take short walks as told by your doctor. Gradually become more active. You may need help to strengthen your muscles and build endurance. ? Avoid lifting, pushing, or pulling anything heavier than 10 pounds (4.5 kg) for at least 6 weeks after surgery.  Do not drive until your doctor says it is okay.  Ask your doctor when you can go back to work.  Ask your doctor when you can begin sexual  activity again.  Follow up with your doctor as told. Contact a doctor if:  You have puffiness, redness, more pain, or fluid draining from the incision site.  You have a fever.  You have puffiness in your ankles or legs.  You have pain in your legs.  You gain 2 or more pounds (0.9 kg) a day.  You feel sick to your stomach (nauseous) or throw up (vomit).  You have watery poop (diarrhea). Get help right away if:  You have chest pain that goes to your jaw or arms.  You have shortness of breath.  You have a fast or irregular heartbeat.  You notice a "clicking" in your breastbone when you move.  You have numbness or weakness in your arms or legs.  You feel dizzy or light-headed. This information is not intended to replace advice given to you by your health care provider. Make sure you discuss any questions you have with your health care provider. Document Released: 01/20/2013 Document Revised: 06/23/2015 Document Reviewed: 06/24/2012 Elsevier Interactive Patient Education  2017 [employer].

## 2017-10-05 NOTE — Plan of Care (Signed)
  Problem: Clinical Measurements: Goal: Respiratory complications will improve Outcome: Progressing   Problem: Coping: Goal: Level of anxiety will decrease Outcome: Progressing   Problem: Pain Managment: Goal: General experience of comfort will improve Outcome: Progressing  Being managed with scheduled Tylenol Problem: Safety: Goal: Ability to remain free from injury will improve Outcome: Progressing   Problem: Cardiac: Goal: Will achieve and/or maintain hemodynamic stability Outcome: Progressing  SBP, MAP and CO/CI are stable and in a normal range, no issues Problem: Skin Integrity: Goal: Wound healing without signs and symptoms of infection Outcome: Progressing

## 2017-10-06 LAB — BASIC METABOLIC PANEL
Anion gap: 9 (ref 5–15)
BUN: 14 mg/dL (ref 8–23)
CO2: 30 mmol/L (ref 22–32)
Calcium: 9 mg/dL (ref 8.9–10.3)
Chloride: 96 mmol/L — ABNORMAL LOW (ref 98–111)
Creatinine, Ser: 0.83 mg/dL (ref 0.61–1.24)
GFR calc Af Amer: >60 mL/min (ref >60)
GFR calc non Af Amer: >60 mL/min (ref >60)
Glucose, Bld: 132 mg/dL — ABNORMAL HIGH (ref 70–99)
Potassium: 4.2 mmol/L (ref 3.5–5.1)
Sodium: 135 mmol/L (ref 135–145)

## 2017-10-06 NOTE — Anesthesia Postprocedure Evaluation (Signed)
Anesthesia Post Note  Patient: Jeff Decker  Procedure(s) Performed: CORONARY ARTERY BYPASS GRAFTING (CABG) x 4, USING LEFT INTERNAL MAMMARY ARTERY AND RIGHT GREATER SAPHENOUS VEIN HARVESTED ENDOSCOPICALLY. LIMA TO LAD, SVG TO DIAG, SVG TO OM, SVG TO PDA. (N/A Chest) TRANSESOPHAGEAL ECHOCARDIOGRAM (TEE) (N/A )     Patient location during evaluation: SICU Anesthesia Type: General Level of consciousness: patient remains intubated per anesthesia plan Pain management: pain level controlled Vital Signs Assessment: post-procedure vital signs reviewed and stable Respiratory status: patient remains intubated per anesthesia plan Cardiovascular status: stable Postop Assessment: no apparent nausea or vomiting Anesthetic complications: no    Last Vitals:  Vitals:   10/06/17 1450 10/06/17 1928  BP:  112/66  Pulse:  (!) 53  Resp: (!) 26 16  Temp:  37 C  SpO2:  93%    Last Pain:  Vitals:   10/06/17 1928  TempSrc: Oral  PainSc:                  [name]

## 2017-10-06 NOTE — Progress Notes (Signed)
EPW removed per protocol; tips intact. Pt tolerated well; VSS. Pt educated to remain in bed for 1 hour. Will continue to monitor.  Arnell Sieving, RN, BSN

## 2017-10-06 NOTE — Progress Notes (Addendum)
      [address]       [place] [address]             [phone]        3 Days Post-Op Procedure(s) (LRB): CORONARY ARTERY BYPASS GRAFTING (CABG) x 4, USING LEFT INTERNAL MAMMARY ARTERY AND RIGHT GREATER SAPHENOUS VEIN HARVESTED ENDOSCOPICALLY. LIMA TO LAD, SVG TO DIAG, SVG TO OM, SVG TO PDA. (N/A) TRANSESOPHAGEAL ECHOCARDIOGRAM (TEE) (N/A)  Subjective: Patient without complaints this am. He is about to eat breakfast.  Objective: Vital signs in last 24 hours: Temp:  [98.7 F (37.1 C)-99.2 F (37.3 C)] 98.9 F (37.2 C) (09/08 0429) Pulse Rate:  [74-88] 82 (09/08 0429) Cardiac Rhythm: Normal sinus rhythm (09/07 2144) Resp:  [14-29] 16 (09/08 0429) BP: (96-122)/(53-89) 109/63 (09/08 0429) SpO2:  [90 %-99 %] 90 % (09/08 0429) Weight:  [84.7 kg] 84.7 kg (09/08 0347)  Pre op weight  89 kg Current Weight  10/06/17 84.7 kg       Intake/Output from previous day: 09/07 0701 - 09/08 0700 In: 260 [P.O.:240; I.V.:20] Out: 3100 [Urine:3100]   Physical Exam:  Cardiovascular: RRR Pulmonary: Slightly diminished at bases Abdomen: Soft, non tender, bowel sounds present. Extremities: Trace  bilateral lower extremity edema. Wounds: Sternal dressing removed and wound is clean and dry.  No erythema or signs of infection. RLE dressings partially removed and wounds are clean and dry.  Lab Results: CBC: Recent Labs    10/04/17 1522 10/04/17 1526 10/05/17 0217  WBC 11.3*  --  10.8*  HGB 10.2* 9.9* 9.1*  HCT 31.8* 29.0* 28.2*  PLT 169  --  145*   BMET:  Recent Labs    10/05/17 0217 10/06/17 0437  NA 133* 135  K 4.1 4.2  CL 99 96*  CO2 27 30  GLUCOSE 128* 132*  BUN 11 14  CREATININE 0.69 0.83  CALCIUM 8.2* 9.0    PT/INR:  Lab Results  Component Value Date   INR 1.22 10/03/2017   INR 0.96 10/02/2017   ABG:  INR: Will add last result for INR, ABG once components are confirmed Will add last 4 CBG results once components are  confirmed  Assessment/Plan:  1. CV - SR. On Lopressor 12.5 mg bid. 2.  Pulmonary - On room air. Encourage incentive spirometer. 3. Volume Overload - On Metolazone 2.5 mg daily and Lasix 40 mg daily 4.  Acute blood loss anemia - H and H yesterday 9.1 and 28.2 5. Mild thrombocytopenia-platelets yesterday 145,000 6. Remove EPW  Donielle M ZimmermanPA-C 10/06/2017,7:46 AM [phone]   Chart reviewed, patient examined, agree with above. He feels well. Diuresed -2800 cc yesterday and weight is down to preop so will stop diuresis after today. If he is feeling well tomorrow he could go home.

## 2017-10-06 NOTE — Plan of Care (Signed)
  Problem: Education: Goal: Knowledge of General Education information will improve Description Including pain rating scale, medication(s)/side effects and non-pharmacologic comfort measures Outcome: Progressing   Problem: Health Behavior/Discharge Planning: Goal: Ability to manage health-related needs will improve Outcome: Progressing   Problem: Clinical Measurements: Goal: Will remain free from infection Outcome: Progressing Goal: Respiratory complications will improve Outcome: Progressing   Problem: Education: Goal: Knowledge of disease or condition will improve Outcome: Progressing

## 2017-10-07 LAB — GLUCOSE, CAPILLARY
Glucose-Capillary: 114 mg/dL — ABNORMAL HIGH (ref 70–99)
Glucose-Capillary: 98 mg/dL (ref 70–99)

## 2017-10-07 MED ORDER — INFLUENZA VAC SPLIT HIGH-DOSE 0.5 ML IM SUSY
0.5000 mL | PREFILLED_SYRINGE | INTRAMUSCULAR | Status: DC
  Filled 2017-10-07: qty 0.5

## 2017-10-07 MED ORDER — ACETAMINOPHEN 325 MG PO TABS: 650.0000 mg | ORAL_TABLET | Freq: Four times a day (QID) | ORAL | Status: AC | PRN

## 2017-10-07 MED ORDER — METOPROLOL TARTRATE 25 MG PO TABS: 12.5000 mg | ORAL_TABLET | Freq: Two times a day (BID) | ORAL | 1 refills | Status: DC

## 2017-10-07 MED ORDER — OXYCODONE HCL 5 MG PO TABS: 5.0000 mg | ORAL_TABLET | Freq: Four times a day (QID) | ORAL | 0 refills | Status: DC | PRN

## 2017-10-07 NOTE — Progress Notes (Signed)
Brief cardiology follow up note. Patient is pending discharge.  CARDIOLOGY RECOMMENDATIONS:  Discharge is anticipated in the next 48 hours. Recommendations for medications and follow up:  Discharge Medications: Continue medications as they are currently listed in the [facility]. Exceptions to the above:  Consider consolidation of metoprolol tartrate to metoprolol succinate 25 mg daily  Intolerant to statins, will need to discuss PCSK9 inhibitor in lipid clinic  Blood pressure has been in the low 473F systolic. Evaluate after discharge for possible initiation of ACE inhibitor  Follow Up: The patient's Primary Cardiologist is Dr. Marlou Porch  Follow up in the office in 2 week(s).  Signed,  Buford Dresser, MD  9:44 AM 10/07/2017  [facility]

## 2017-10-07 NOTE — Care Management Note (Signed)
Case Management Note Marvetta Gibbons RN, BSN Unit 4E- RN Care Coordinator  [phone]  Patient Details  Name: Jeff Decker MRN: [id number] Date of Birth: [birth year]  Subjective/Objective:   Pt admitted with [place], s/p CABGx4 on 9/5                 Action/Plan: PTA pt lived at home alone, reports that she has family that is to come assist on transition home. No CM needs or DME needs noted for transition home. PCP- Terrill Mohr   Expected Discharge Date:  10/07/17               Expected Discharge Plan:  Home/Self Care  In-House Referral:     Discharge planning Services  CM Consult  Post Acute Care Choice:  NA Choice offered to:  NA  DME Arranged:    DME Agency:     HH Arranged:    HH Agency:     Status of Service:  Completed, signed off  If discussed at [place] of Stay Meetings, dates discussed:    Discharge Disposition: home/self care   Additional Comments:  Dawayne Patricia, RN 10/07/2017, 10:27 AM

## 2017-10-07 NOTE — Progress Notes (Signed)
[id number] 80 SR  Lying 101/66, 110/64 sitting.  Pt c/o feeling sweaty. Assisted to recliner. Pt stated he walked earlier. Wants family here for ed. Discussed CRP 2 and referred to [place]. Will follow up when family here. Graylon Good RN BSN 10/07/2017 9:17 AM

## 2017-10-07 NOTE — Progress Notes (Signed)
[phone] Education completed with pt and family and understanding voiced. Stressed importance of IS and walking. Discussed staying in the tube, ex ed, heart healthy diet, and CRP 2. Pt has been walking to bathroom without walker and does not think he will need one for home. Referred to [place] CRP 2. Pt stated he is ready to go home. Graylon Good RN BSN 10/07/2017 11:32 AM

## 2017-10-07 NOTE — Progress Notes (Deleted)
[phone] Education completed with pt and family who voiced understanding . Pt stated he has been walking to the bathroom independently without walker and does not think he needs one. Reviewed staying in the tube, gave heart healthy diet, ex ed and discussed CRP 2. Encouraged IS. Pt stated he is ready to go home. [name] BSN   10/07/2017 1127 AM

## 2017-10-07 NOTE — Progress Notes (Addendum)
Patient in a stable condition, discharge education reviewed with patient , he verbalized understanding, patient belongings at bedside,precrip[tion given to patient,  patient awaiting his family for transportation home. IV removed, tele dc

## 2017-10-07 NOTE — Progress Notes (Addendum)
      [address]       [place] [address]             [phone]      4 Days Post-Op Procedure(s) (LRB): CORONARY ARTERY BYPASS GRAFTING (CABG) x 4, USING LEFT INTERNAL MAMMARY ARTERY AND RIGHT GREATER SAPHENOUS VEIN HARVESTED ENDOSCOPICALLY. LIMA TO LAD, SVG TO DIAG, SVG TO OM, SVG TO PDA. (N/A) TRANSESOPHAGEAL ECHOCARDIOGRAM (TEE) (N/A) Subjective: No issues this morning. He feels good and is ready for home.  Objective: Vital signs in last 24 hours: Temp:  [97.7 F (36.5 C)-99.5 F (37.5 C)] 97.8 F (36.6 C) (09/09 0726) Pulse Rate:  [53-110] 53 (09/08 1928) Cardiac Rhythm: Normal sinus rhythm (09/09 0702) Resp:  [13-26] 14 (09/09 0726) BP: (104-115)/(62-74) 113/65 (09/09 0726) SpO2:  [92 %-96 %] 95 % (09/09 0726) Weight:  [82.6 kg] 82.6 kg (09/09 0430)     Intake/Output from previous day: 09/08 0701 - 09/09 0700 In: 280 [P.O.:280] Out: 1475 [Urine:1475] Intake/Output this shift: No intake/output data recorded.  General appearance: alert, cooperative and no distress Heart: regular rate and rhythm, S1, S2 normal, no murmur, click, rub or gallop Lungs: clear to auscultation bilaterally Abdomen: soft, non-tender; bowel sounds normal; no masses,  no organomegaly Extremities: extremities normal, atraumatic, no cyanosis or edema Wound: clean and dry   Lab Results: Recent Labs    10/04/17 1522 10/04/17 1526 10/05/17 0217  WBC 11.3*  --  10.8*  HGB 10.2* 9.9* 9.1*  HCT 31.8* 29.0* 28.2*  PLT 169  --  145*   BMET:  Recent Labs    10/05/17 0217 10/06/17 0437  NA 133* 135  K 4.1 4.2  CL 99 96*  CO2 27 30  GLUCOSE 128* 132*  BUN 11 14  CREATININE 0.69 0.83  CALCIUM 8.2* 9.0    PT/INR: No results for input(s): LABPROT, INR in the last 72 hours. ABG    Component Value Date/Time   PHART 7.319 (L) 10/03/2017 2036   HCO3 25.2 10/03/2017 2036   TCO2 24 10/04/2017 1526   ACIDBASEDEF 1.0 10/03/2017 2036   O2SAT 97.0 10/03/2017 2036   CBG  (last 3)  Recent Labs    10/04/17 1312 10/04/17 1520 10/05/17 0602  GLUCAP 119* 136* 122*    Assessment/Plan: S/P Procedure(s) (LRB): CORONARY ARTERY BYPASS GRAFTING (CABG) x 4, USING LEFT INTERNAL MAMMARY ARTERY AND RIGHT GREATER SAPHENOUS VEIN HARVESTED ENDOSCOPICALLY. LIMA TO LAD, SVG TO DIAG, SVG TO OM, SVG TO PDA. (N/A) TRANSESOPHAGEAL ECHOCARDIOGRAM (TEE) (N/A)  1. CV - SR. On Lopressor 12.5 mg bid. BP well controlled 2.  Pulmonary - On room air with good oxygen saturation. Encourage incentive spirometer. 3. Volume Overload - On Metolazone 2.5 mg daily and Lasix 40 mg daily. Fluid balance -1.1L yesterday.  4.  Acute blood loss anemia - H and H yesterday 9.1 and 28.2 5. Mild thrombocytopenia-platelets yesterday 145,000  Plan: Discharge today.   LOS: 6 days    Elgie Collard 10/07/2017   Chart reviewed, patient examined, agree with above. He feels well and has had no arrhythmias. Weight is below preop. I think he can go home today.

## 2017-10-08 MED FILL — Potassium Chloride Inj 2 mEq/ML: INTRAVENOUS | Qty: 40 | Status: AC

## 2017-10-08 MED FILL — Magnesium Sulfate Inj 50%: INTRAMUSCULAR | Qty: 10 | Status: AC

## 2017-10-08 MED FILL — Heparin Sodium (Porcine) Inj 1000 Unit/ML: INTRAMUSCULAR | Qty: 30 | Status: AC

## 2017-10-08 MED FILL — Lidocaine HCl Local Preservative Free (PF) Inj 1%: INTRAMUSCULAR | Qty: 30 | Status: AC

## 2017-10-10 ENCOUNTER — Telehealth (HOSPITAL_COMMUNITY): Payer: Self-pay

## 2017-10-10 MED FILL — Heparin Sodium (Porcine) Inj 1000 Unit/ML: INTRAMUSCULAR | Qty: 10 | Status: AC

## 2017-10-10 MED FILL — Sodium Chloride IV Soln 0.9%: INTRAVENOUS | Qty: 2000 | Status: AC

## 2017-10-10 MED FILL — Mannitol IV Soln 20%: INTRAVENOUS | Qty: 500 | Status: AC

## 2017-10-10 MED FILL — Sodium Bicarbonate IV Soln 8.4%: INTRAVENOUS | Qty: 50 | Status: AC

## 2017-10-10 MED FILL — Lidocaine HCl(Cardiac) IV PF Soln Pref Syr 100 MG/5ML (2%): INTRAVENOUS | Qty: 5 | Status: AC

## 2017-10-10 MED FILL — Electrolyte-R (PH 7.4) Solution: INTRAVENOUS | Qty: 3000 | Status: AC

## 2017-10-10 NOTE — Telephone Encounter (Signed)
Called patient to see if he is interested in the Cardiac Rehab Program. Patient expressed interest. Explained scheduling process and went over insurance, patient verbalized understanding. Will contact patient for scheduling once f/u has been completed.  °

## 2017-10-10 NOTE — Telephone Encounter (Signed)
Pt insurance is active and benefits verified through [place]. Co-pay $0.00, DED $0.00/$0.00 met, out of pocket $5,900.00/$681.88 met, co-insurance 20%. No pre-authorization. Ron/BCBS, 10/10/17 @ 2:57PM, REF#[id number]  Will contact patient to see if he is interested in the Cardiac Rehab Program. If interested, patient will need to complete follow up appt. Once completed, patient will be contacted for scheduling upon review by the RN Navigator.

## 2017-10-15 ENCOUNTER — Ambulatory Visit (INDEPENDENT_AMBULATORY_CARE_PROVIDER_SITE_OTHER): Payer: Self-pay

## 2017-10-15 DIAGNOSIS — Z4802 Encounter for removal of sutures: Secondary | ICD-10-CM

## 2017-10-15 NOTE — Progress Notes (Signed)
Removed 3 sutures from chest tube sites with no signs of infection and patient tolerated well. S/CP CABG

## 2017-10-18 ENCOUNTER — Ambulatory Visit (INDEPENDENT_AMBULATORY_CARE_PROVIDER_SITE_OTHER): Payer: Medicare Other | Admitting: Pharmacist

## 2017-10-18 DIAGNOSIS — E782 Mixed hyperlipidemia: Secondary | ICD-10-CM | POA: Diagnosis not present

## 2017-10-18 MED ORDER — ROSUVASTATIN CALCIUM 5 MG PO TABS: 5.0000 mg | ORAL_TABLET | Freq: Every day | ORAL | 11 refills | Status: DC

## 2017-10-18 NOTE — Patient Instructions (Addendum)
It was nice to meet you today  Start taking rosuvastatin (Crestor) 5mg  once a day for your cholesterol  Your LDL goal is < 70  If you notice any muscle pain, call Jinny Blossom, Pharmacist [phone]  If you feel ok, increase your dose to 10mg  daily  We will recheck your cholesterol in 2 months - Monday November 18th any time after 7:30am for fasting lab work  If you do not tolerate the Crestor, we will apply for Repatha injections with your insurance

## 2017-10-18 NOTE — Progress Notes (Signed)
Patient ID: Jeff Decker                 DOB: May 25, 1945                    MRN: [id number]     HPI: Jeff Decker is a 72 y.o. male patient referred to lipid clinic by . PMH is significant for HLD, CAD s/p stent to prox LAD in 1998, GERD, and prostate cancer. He was seen by cardiology for the first time in 08/2017 for SOB and chest pain. Due to unstable angina, he underwent left cardiac cath on 10/01/17 which revealed severe 2 vessel and left main CAD with 50% distal left main, 90% ostial and mid LAD, diffuse 75% in stent restenosis, and 99% PDA. He underwent CABG x4 and presents today for further lipid management.  Pt presents today in good spirits. He has tried multiple statins in the past however it has been about 20 years or so. He recalls trying Crestor, Lipitor, and Vytorin, however dosing information is not available. He experienced joint and muscle pain in his legs that affected his ability to walk after ~60 days of being on statin therapy. In each case, symptoms resolved with statin discontinuation.  Current Medications: none Intolerances: Crestor, Lipitor, Vytorin Risk Factors: CAD, CABG x4 vessels LDL goal: 70mg /dL, non HDL < 100  Diet: Breakfast - oatmeal or eggs/grits. Lunch - grilled chicken sandwich. Dinner - fish.  Exercise: Walking 1 mile each day  Family History:  The patient's family history includes Breast cancer in his mother; Colon cancer (age of onset: 78) in his father; Prostate cancer in his father. There is no history of Stomach cancer, Esophageal cancer, or Rectal cancer.  Social History: Former smoker, quit in 1988. Drinks 4 cans of beer per week, denies illicit drug use.   Labs: 04/15/17: TC 236, TG 168, HDL 43, LDL 156, non-HDL 193 (no lipid lowering therapy)  Past Medical History:  Diagnosis Date  . Allergy   . Arthritis   . Cancer ([place]) 03/2016   prostate and bone , chemo completed  . Coronary artery disease   . GERD (gastroesophageal  reflux disease)   . Myocardial infarction [facility]) 1989    Current Outpatient Medications on File Prior to Visit  Medication Sig Dispense Refill  . acetaminophen (TYLENOL) 325 MG tablet Take 2 tablets (650 mg total) by mouth every 6 (six) hours as needed for mild pain.    Marland Kitchen aspirin EC 81 MG tablet Take 81 mg by mouth daily.    . Calcium Carb-Cholecalciferol (CALCIUM 600+D) 600-800 MG-UNIT TABS Take 1 tablet by mouth daily.     . fexofenadine (ALLEGRA) 180 MG tablet Take 180 mg by mouth daily.    Marland Kitchen glucosamine-chondroitin 500-400 MG tablet Take 1 tablet by mouth daily.     . metoprolol tartrate (LOPRESSOR) 25 MG tablet Take 0.5 tablets (12.5 mg total) by mouth 2 (two) times daily. 30 tablet 1  . Multiple Vitamin (MULTI-VITAMINS) TABS Take by mouth.    Marland Kitchen omeprazole (PRILOSEC) 20 MG capsule Take 20 mg by mouth daily before breakfast.  1  . oxyCODONE (OXY IR/ROXICODONE) 5 MG immediate release tablet Take 1 tablet (5 mg total) by mouth every 6 (six) hours as needed for severe pain. 30 tablet 0   No current facility-administered medications on file prior to visit.     Allergies  Allergen Reactions  . Statins Other (See Comments)    Muscle pain Tried 3  different statins    Assessment/Plan:  1. Hyperlipidemia - LDL above goal < 70 due to history of ASCVD. Pt is intolerant to 3 statins however dosing information is not available since pt took statins over 20 years ago. Pt will likely need PCSK9i therapy however his particular insurance will not approve therapy until he has tried the lowest starting dose of a statin. Will start rosuvastatin 5mg  daily today and recheck lipids in 2 months. If pt tolerates well, will plan to titrate to highest tolerated dose. Advised pt to contact clinic with any adverse effects. If he has issues tolerating rosuvastatin 5mg , will be able to obtain PCSK9i approval through his insurance. He signed Safety Net Application in clinic today to help with copay if PCSK9i is  pursued.   [name] E. Natalee Tomkiewicz, PharmD, BCACP, [place] [address] [place], [place] [address] Phone: [phone]; Fax: [phone] 10/18/2017 10:27 AM

## 2017-10-22 ENCOUNTER — Encounter: Payer: Self-pay | Admitting: Nurse Practitioner

## 2017-10-22 ENCOUNTER — Other Ambulatory Visit: Payer: Self-pay | Admitting: *Deleted

## 2017-10-22 ENCOUNTER — Ambulatory Visit (INDEPENDENT_AMBULATORY_CARE_PROVIDER_SITE_OTHER): Payer: Medicare Other | Admitting: Nurse Practitioner

## 2017-10-22 VITALS — BP 110/70 | HR 60 | Ht 71.0 in | Wt 182.1 lb

## 2017-10-22 DIAGNOSIS — Z951 Presence of aortocoronary bypass graft: Secondary | ICD-10-CM | POA: Diagnosis not present

## 2017-10-22 DIAGNOSIS — E782 Mixed hyperlipidemia: Secondary | ICD-10-CM

## 2017-10-22 DIAGNOSIS — I259 Chronic ischemic heart disease, unspecified: Secondary | ICD-10-CM | POA: Diagnosis not present

## 2017-10-22 NOTE — Progress Notes (Signed)
CARDIOLOGY OFFICE NOTE  Date:  10/22/2017    Jeff Decker Date of Birth: [birth year] Medical Record #[id number]  PCP:  Nickola Major, MD  Cardiologist:  [facility]  Chief Complaint  Patient presents with  . Coronary Artery Disease    Post hospital visit - seen for Dr. Marlou Porch    History of Present Illness: Jeff Decker is a 72 y.o. male who presents today for a post hospital visit. Seen for Dr. Marlou Porch.   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   Seen here at the end of August - noting more chest pain and DOE - referred for cardiac cath.  Cardiac catheterization today showed a 50% distal left main stenosis extending into the ostium of the LAD with significant calcification. The ostial to proximal LAD had 90% stenosis. The right coronary artery had 60% mid vessel stenosis and 99% stenosis at the ostium of the PDA branch. The left circumflex was a large dominant vessel with no significant stenoses but compromised by the distal left main stenosis. Left ventricular end-diastolic pressure is normal. Left ventricular ejection fraction was reduced to 35 to 45%.  It was recommended to proceed with CABG  to prevent further ischemia and infarction and improve his quality of life.  Pre operative carotid duplex showed no significant internal carotid artery stenosis bilaterally. He underwent a CABG x 4 on 10/03/2017.  Post op course was uneventful. He was treated for volume overload. He had ABL anemia. No transfusion. Prediabetic.   Comes in today. Here alone. He is doing very well. No further chest pain. Some mild surgical soreness - using Tylenol. Not dizzy or lightheaded. No shortness of breath. Back sleeping in the bed. Walking several times a day. No lifting. No fever or chills or cough. Weight and BP at home look great. Not dizzy.  He does have some numbness/sensitivity over the vein harvesting site. He really has no concerns and feels like he is doing well.    Past Medical History:  Diagnosis Date  . Allergy   . Arthritis   . Cancer ([place]) 03/2016   prostate and bone , chemo completed  . Coronary artery disease   . GERD (gastroesophageal reflux disease)   . Myocardial infarction ([place]) 1989    Past Surgical History:  Procedure Laterality Date  . COLONOSCOPY  2006  . CORONARY ANGIOPLASTY WITH STENT PLACEMENT    . CORONARY ARTERY BYPASS GRAFT N/A 10/03/2017   Procedure: CORONARY ARTERY BYPASS GRAFTING (CABG) x 4, USING LEFT INTERNAL MAMMARY ARTERY AND RIGHT GREATER SAPHENOUS VEIN HARVESTED ENDOSCOPICALLY. LIMA TO LAD, SVG TO DIAG, SVG TO OM, SVG TO PDA.;  Surgeon: Gaye Pollack, MD;  Location: [facility];  Service: Open Heart Surgery;  Laterality: N/A;  . HEMORRHOID SURGERY    . LEFT HEART CATH AND CORONARY ANGIOGRAPHY N/A 10/01/2017   Procedure: LEFT HEART CATH AND CORONARY ANGIOGRAPHY;  Surgeon: Martinique, Peter M, MD;  Location: [place] [facility];  Service: Cardiovascular;  Laterality: N/A;  . ORIF SHOULDER FRACTURE     72 years old  . PROSTATE BIOPSY  03/2016  . TEE WITHOUT CARDIOVERSION N/A 10/03/2017   Procedure: TRANSESOPHAGEAL ECHOCARDIOGRAM (TEE);  Surgeon: Gaye Pollack, MD;  Location: [place];  Service: Open Heart Surgery;  Laterality: N/A;  . TONSILLECTOMY AND ADENOIDECTOMY     72 years old  Medications: Current Meds  Medication Sig  . acetaminophen (TYLENOL) 325 MG tablet Take 2 tablets (650 mg total) by mouth every 6 (six) hours as needed for mild pain.  Marland Kitchen aspirin EC 81 MG tablet Take 81 mg by mouth daily.  . Calcium Carb-Cholecalciferol (CALCIUM 600+D) 600-800 MG-UNIT TABS Take 1 tablet by mouth daily.   . fexofenadine (ALLEGRA) 180 MG tablet Take 180 mg by mouth daily.  Marland Kitchen glucosamine-chondroitin 500-400 MG tablet Take 1 tablet by mouth daily.   . metoprolol tartrate (LOPRESSOR) 25 MG tablet Take 0.5  tablets (12.5 mg total) by mouth 2 (two) times daily.  . Multiple Vitamin (MULTI-VITAMINS) TABS Take by mouth.  Marland Kitchen omeprazole (PRILOSEC) 20 MG capsule Take 20 mg by mouth daily before breakfast.  . rosuvastatin (CRESTOR) 5 MG tablet Take 1 tablet (5 mg total) by mouth daily.     Allergies: Allergies  Allergen Reactions  . Statins Other (See Comments)    Muscle pain Tried 3 different statins    Social History: The patient  reports that he quit smoking about 31 years ago. He has never used smokeless tobacco. He reports that he drinks about 4.0 standard drinks of alcohol per week. He reports that he does not use drugs.   Family History: The patient's family history includes Breast cancer in his mother; Colon cancer (age of onset: 56) in his father; Prostate cancer in his father.   Review of Systems: Please see the history of present illness.   Otherwise, the review of systems is positive for none.   All other systems are reviewed and negative.   Physical Exam: VS:  BP 110/70 (BP Location: Left Arm, Patient Position: Sitting, Cuff Size: Normal)   Pulse 60   Ht 5\' 11"  (1.803 m)   Wt 182 lb 1.9 oz (82.6 kg)   BMI 25.40 kg/m  .  BMI Body mass index is 25.4 kg/m.  Wt Readings from Last 3 Encounters:  10/22/17 182 lb 1.9 oz (82.6 kg)  10/07/17 182 lb 1.6 oz (82.6 kg)  09/24/17 191 lb 3.2 oz (86.7 kg)    General: Pleasant. Well developed, well nourished and in no acute distress.  Looks younger than his stated age.  HEENT: Normal.  Neck: Supple, no JVD, carotid bruits, or masses noted.  Cardiac: Regular rate and rhythm. No murmurs, rubs, or gallops. No edema. His sternal incision looks great. Vein harvesting of the right leg looks fine. Good distal pulses in the right foot.  Respiratory:  Lungs are clear to auscultation bilaterally with normal work of breathing.  GI: Soft and nontender.  MS: No deformity or atrophy. Gait and ROM intact.  Skin: Warm and dry. Color is normal.  Neuro:   Strength and sensation are intact and no gross focal deficits noted.  Psych: Alert, appropriate and with normal affect.   LABORATORY DATA:  EKG:  EKG is ordered today. This demonstrates NSR with septal Q's and evolving anterior T wave changes.   Lab Results  Component Value Date   WBC 10.8 (H) 10/05/2017   HGB 9.1 (L) 10/05/2017   HCT 28.2 (L) 10/05/2017   PLT 145 (L) 10/05/2017   GLUCOSE 132 (H) 10/06/2017   ALT 21 10/02/2017   AST 20 10/02/2017   NA 135 10/06/2017   K 4.2 10/06/2017   CL 96 (L) 10/06/2017   CREATININE 0.83 10/06/2017   BUN 14 10/06/2017   CO2 30 10/06/2017   INR 1.22 10/03/2017   HGBA1C 5.7 (H) 10/02/2017  BNP (last 3 results) No results for input(s): BNP in the last 8760 hours.  ProBNP (last 3 results) No results for input(s): PROBNP in the last 8760 hours.   Other Studies Reviewed Today:  LEFT HEART CATH AND CORONARY ANGIOGRAPHY on 10/01/2017:  Conclusion     Prox LAD lesion is 75% stenosed.  Ost LAD to Prox LAD lesion is 90% stenosed.  Prox LAD to Mid LAD lesion is 90% stenosed.  Dist LM to Ost LAD lesion is 50% stenosed.  Mid RCA lesion is 60% stenosed.  Ost RPDA to RPDA lesion is 99% stenosed.  There is moderate left ventricular systolic dysfunction.  LV end diastolic pressure is normal.  The left ventricular ejection fraction is 35-45% by visual estimate.  1. Severe 2 vessel and left main CAD.  - 50% distal left main - 90% ostial and mid LAD, diffuse 75% in stent restenosis - 99% PDA 2. Moderate LV dysfunction 3. Normal LVEDP  Plan: Recommend CT surgery consult for CABG.     1. Median Sternotomy 2. Extracorporeal circulation 3. Coronary artery bypass grafting x 4   Left internal mammary graft to the LAD  SVG to diagonal  SVG to distal LCX  SVG to PDA 4. Endoscopic vein harvest from the rightleg by Dr. Cyndia Bent on 10/03/2017   Echo Study Conclusions 09/2017  - Left ventricle: The  cavity size was normal. Wall thickness was   normal. Systolic function was normal. The estimated ejection   fraction was in the range of 50% to 55%. Wall motion was normal;   there were no regional wall motion abnormalities. Doppler   parameters are consistent with abnormal left ventricular   relaxation (grade 1 diastolic dysfunction). - Aortic valve: There was trivial regurgitation. - Right atrium: The atrium was mildly dilated. - Pulmonic valve: There was moderate regurgitation.  Assessment/Plan: 1. Post CABG - evolving EKG changes. Will repeat on return. He is doing very well clinically. Reviewed lifting restrictions. Lab today. He is interested in cardiac rehab and is walking three times a day.   2. HTN - BP is great - no changes made today.   3. HLD - on statin - has been seen in the lipid clinic - has fasting labs in about 2 weeks.   4. Carotid stenosis - 1 to 39% per pre CABG dopplers - would repeat study in one to 2 years.   Current medicines are reviewed with the patient today.  The patient does not have concerns regarding medicines other than what has been noted above.  The following changes have been made:  See above.  Labs/ tests ordered today include:   No orders of the defined types were placed in this encounter.    Disposition:   FU with Dr. Marlou Porch in about a month with repeat EKG - I am happy to see back as needed.    Patient is agreeable to this plan and will call if any problems develop in the interim.   SignedTruitt Merle, NP  10/22/2017 11:34 AM  [place] [address] [place], [place]  [address] Phone: [phone] Fax: [phone]

## 2017-10-22 NOTE — Addendum Note (Signed)
Addended by: Marciano Sequin on: 10/22/2017 12:11 PM   Modules accepted: Orders

## 2017-10-22 NOTE — Patient Instructions (Addendum)
We will be checking the following labs today - BMET and CBC   Medication Instructions:    Continue with your current medicines.     Testing/Procedures To Be Arranged:  N/A  Follow-Up:   See Dr. Marlou Porch with EKG in 4 to 6 weeks    Other Special Instructions:   I have sent a message to cardiac rehab.     If you need a refill on your cardiac medications before your next appointment, please call your pharmacy.   Call the [place] office at [phone] if you have any questions, problems or concerns.

## 2017-10-23 ENCOUNTER — Telehealth (HOSPITAL_COMMUNITY): Payer: Self-pay

## 2017-10-23 LAB — BASIC METABOLIC PANEL
BUN/Creatinine Ratio: 25 — ABNORMAL HIGH (ref 10–24)
BUN: 19 mg/dL (ref 8–27)
CO2: 23 mmol/L (ref 20–29)
Calcium: 9.9 mg/dL (ref 8.6–10.2)
Chloride: 99 mmol/L (ref 96–106)
Creatinine, Ser: 0.76 mg/dL (ref 0.76–1.27)
GFR calc Af Amer: 105 mL/min/{1.73_m2} (ref >59)
GFR calc non Af Amer: 91 mL/min/{1.73_m2} (ref >59)
Glucose: 90 mg/dL (ref 65–99)
Potassium: 4.8 mmol/L (ref 3.5–5.2)
Sodium: 138 mmol/L (ref 134–144)

## 2017-10-23 LAB — CBC
Hematocrit: 32.5 % — ABNORMAL LOW (ref 37.5–51.0)
Hemoglobin: 10.6 g/dL — ABNORMAL LOW (ref 13.0–17.7)
MCH: 28.4 pg (ref 26.6–33.0)
MCHC: 32.6 g/dL (ref 31.5–35.7)
MCV: 87 fL (ref 79–97)
Platelets: 566 10*3/uL — ABNORMAL HIGH (ref 150–450)
RBC: 3.73 x10E6/uL — ABNORMAL LOW (ref 4.14–5.80)
RDW: 12.8 % (ref 12.3–15.4)
WBC: 7.1 10*3/uL (ref 3.4–10.8)

## 2017-10-23 NOTE — Telephone Encounter (Signed)
Called patient to see if he was interested in participating in the Cardiac Rehab Program. Patient stated yes. Patient will come in for orientation on 12/12/17 @ 8:00AM and will attend the 8:15AM exercise class.  Mailed homework package.

## 2017-10-29 ENCOUNTER — Other Ambulatory Visit: Payer: Self-pay | Admitting: Physician Assistant

## 2017-11-05 ENCOUNTER — Telehealth: Payer: Self-pay | Admitting: Pharmacist

## 2017-11-05 NOTE — Telephone Encounter (Signed)
Pt called to report muscle pain in his legs since starting rosuvastatin 5mg  daily. He is already intolerant to atorvastatin and Vytorin. His insurance required that he try the lowest starting dose of a statin before trying PCSK9i therapy. He is now intolerant to the lowest starting dose of rosuvastatin. Will send in prior authorization for Repatha coverage.

## 2017-11-06 ENCOUNTER — Telehealth: Payer: Self-pay | Admitting: Pharmacist

## 2017-11-06 MED ORDER — EVOLOCUMAB 140 MG/ML ~~LOC~~ SOAJ: 1.0000 "pen " | SUBCUTANEOUS | 11 refills | Status: DC

## 2017-11-06 NOTE — Telephone Encounter (Signed)
Copay affordable at $47, pt is aware and has f/u labs scheduled in November.

## 2017-11-06 NOTE — Telephone Encounter (Signed)
Prior auth for Repatha approved. Rx sent to pharmacy to determine copay.

## 2017-11-08 ENCOUNTER — Other Ambulatory Visit: Payer: Self-pay | Admitting: Surgery

## 2017-11-08 DIAGNOSIS — Z951 Presence of aortocoronary bypass graft: Secondary | ICD-10-CM

## 2017-11-11 ENCOUNTER — Other Ambulatory Visit: Payer: Self-pay

## 2017-11-11 ENCOUNTER — Ambulatory Visit (INDEPENDENT_AMBULATORY_CARE_PROVIDER_SITE_OTHER): Payer: Self-pay | Admitting: Physician Assistant

## 2017-11-11 ENCOUNTER — Ambulatory Visit
Admission: RE | Admit: 2017-11-11 | Discharge: 2017-11-11 | Disposition: A | Discharge status: Home / Self Care | Payer: Medicare Other | Source: Ambulatory Visit | Attending: Surgery | Admitting: Surgery

## 2017-11-11 VITALS — BP 110/70 | HR 67 | Resp 18 | Ht 71.0 in | Wt 185.0 lb

## 2017-11-11 DIAGNOSIS — Z951 Presence of aortocoronary bypass graft: Secondary | ICD-10-CM

## 2017-11-11 NOTE — Patient Instructions (Signed)
You may return to driving an automobile as long as you are no longer requiring oral narcotic pain relievers during the daytime.  It would be wise to start driving only short distances during the daylight and gradually increase from there as you feel comfortable.  You may continue to gradually increase your physical activity as tolerated.  Refrain from any heavy lifting or strenuous use of your arms and shoulders until at least 8 weeks from the time of your surgery, and avoid activities that cause increased pain in your chest on the side of your surgical incision.  Otherwise you may continue to increase activities without any particular limitations.  Increase the intensity and duration of physical activity gradually.  You are encouraged to enroll and participate in the outpatient cardiac rehab program beginning as soon as practical.

## 2017-11-11 NOTE — Progress Notes (Signed)
  HPI:  Patient returns for routine postoperative follow-up having undergone a CABG x 4 on 10/03/2017 by Dr. Cyndia Bent. Patient denies chest pain, shortness of breath, fever, or chills.  Current Outpatient Medications  Medication Sig Dispense Refill  . acetaminophen (TYLENOL) 325 MG tablet Take 2 tablets (650 mg total) by mouth every 6 (six) hours as needed for mild pain.    Marland Kitchen aspirin EC 81 MG tablet Take 81 mg by mouth daily.    . Calcium Carb-Cholecalciferol (CALCIUM 600+D) 600-800 MG-UNIT TABS Take 1 tablet by mouth daily.     . Evolocumab (REPATHA SURECLICK) 680 MG/ML SOAJ Inject 1 pen into the skin every 14 (fourteen) days. 2 pen 11  . fexofenadine (ALLEGRA) 180 MG tablet Take 180 mg by mouth daily.    Marland Kitchen glucosamine-chondroitin 500-400 MG tablet Take 1 tablet by mouth daily.     . metoprolol tartrate (LOPRESSOR) 25 MG tablet Take 0.5 tablets (12.5 mg total) by mouth 2 (two) times daily. 30 tablet 1  . Multiple Vitamin (MULTI-VITAMINS) TABS Take by mouth.    Marland Kitchen omeprazole (PRILOSEC) 20 MG capsule Take 20 mg by mouth daily before breakfast.  1  Vital Signs: BP 110/70, HR 67, RR 18, Oxygenation 98% on room air  Physical Exam: CV-RRR Pulmonary-Clear to auscultation bilaterally Abdomen-Soft, non tender, bowel sounds present Extremities-No LE edema Wounds-Clean and dry  Diagnostic Tests: CLINICAL DATA:  History of coronary bypass graft  EXAM: CHEST - 2 VIEW  COMPARISON:  10/05/2017  FINDINGS: Postsurgical changes are again noted. Cardiac shadow is within normal limits. The lungs are well aerated without focal infiltrate or sizable effusion. No pneumothorax is noted. No bony abnormality is seen.  IMPRESSION: No acute abnormality noted.   Electronically Signed   By: Inez Catalina M.D.   On: 11/11/2017 12:26   Impression and Plan: Overall, Jeff Decker is recovering well from coronary artery bypass grafting surgery. He already has seen Truitt Merle in follow up on  10/22/2017. The only change to his medications is that he is intolerant of statins so he is going to be put on Repatha. Patient states he is only taking Tylenol for pain. He has already been driving. He has been contacted by cardiac rehab and he wishes to participate. He is scheduled to begin the second week of November. Patient instructed to continue with sternal precautions (I.e. No lifting more than 10 pounds for the next 1-2 weeks). He wants to ride horses and play golf. I told him at least 12 weeks until he rides a horse and that he can play golf in the Spring. He will return to see Dr. Cyndia Bent PRN.    Jeff Skillern, PA-C [facility] [phone]

## 2017-11-14 NOTE — Progress Notes (Signed)
Cardiology Office Note:    Date:  11/15/2017   ID:  Jeff Decker, DOB Apr 02, 1945, MRN [id number]  PCP:  Jerline Pain, MD  Cardiologist:  No primary care provider on file.  Electrophysiologist:  None   Referring MD: Nickola Major, MD     History of Present Illness:    Jeff Decker is a 72 y.o. male with coronary artery disease status post CABG x4 LIMA to LAD, SVG to diagonal, SVG to distal left circumflex, SVG to PDA on 10/03/2017 by Dr. Cyndia Bent here for follow-up of CAD.  He has had trouble in the past with statin intolerance so Repatha has been requested.  Understands lifting restrictions.  He also understands that it will be at least 3 months until he rides a horse again or complete golf in the spring. Repatha started 11/12/17.   Also has an ischemic cardiomyopathy with a EF of approximately 35 to 45% prior to CABG.  After CABG, EF was restored to low normal 50 to 55%.  Overall been doing very well.  Feels great.  No fevers chills nausea vomiting syncope bleeding.  No need for dental antibiotics.  We discussed.  His wife and he enjoys horses.  Past Medical History:  Diagnosis Date  . Allergy   . Arthritis   . Cancer ([place]) 03/2016   prostate and bone , chemo completed  . Coronary artery disease   . GERD (gastroesophageal reflux disease)   . Myocardial infarction ([place]) 1989    Past Surgical History:  Procedure Laterality Date  . COLONOSCOPY  2006  . CORONARY ANGIOPLASTY WITH STENT PLACEMENT    . CORONARY ARTERY BYPASS GRAFT N/A 10/03/2017   Procedure: CORONARY ARTERY BYPASS GRAFTING (CABG) x 4, USING LEFT INTERNAL MAMMARY ARTERY AND RIGHT GREATER SAPHENOUS VEIN HARVESTED ENDOSCOPICALLY. LIMA TO LAD, SVG TO DIAG, SVG TO OM, SVG TO PDA.;  Surgeon: Gaye Pollack, MD;  Location: [facility];  Service: Open Heart Surgery;  Laterality: N/A;  . HEMORRHOID SURGERY    . LEFT HEART CATH AND CORONARY ANGIOGRAPHY N/A 10/01/2017   Procedure: LEFT HEART CATH AND CORONARY  ANGIOGRAPHY;  Surgeon: Martinique, Peter M, MD;  Location: [place] [facility];  Service: Cardiovascular;  Laterality: N/A;  . ORIF SHOULDER FRACTURE     72 years old  . PROSTATE BIOPSY  03/2016  . TEE WITHOUT CARDIOVERSION N/A 10/03/2017   Procedure: TRANSESOPHAGEAL ECHOCARDIOGRAM (TEE);  Surgeon: Gaye Pollack, MD;  Location: [facility];  Service: Open Heart Surgery;  Laterality: N/A;  . TONSILLECTOMY AND ADENOIDECTOMY     72 years old    Current Medications: Current Meds  Medication Sig  . acetaminophen (TYLENOL) 325 MG tablet Take 2 tablets (650 mg total) by mouth every 6 (six) hours as needed for mild pain.  Marland Kitchen aspirin EC 81 MG tablet Take 81 mg by mouth daily.  . Calcium Carb-Cholecalciferol (CALCIUM 600+D) 600-800 MG-UNIT TABS Take 1 tablet by mouth daily.   . Evolocumab (REPATHA SURECLICK) 409 MG/ML SOAJ Inject 1 pen into the skin every 14 (fourteen) days.  . fexofenadine (ALLEGRA) 180 MG tablet Take 180 mg by mouth daily.  Marland Kitchen glucosamine-chondroitin 500-400 MG tablet Take 1 tablet by mouth daily.   . metoprolol tartrate (LOPRESSOR) 25 MG tablet Take 0.5 tablets (12.5 mg total) by mouth 2 (two) times daily.  . Multiple Vitamin (MULTI-VITAMINS) TABS Take by mouth.  Marland Kitchen omeprazole (PRILOSEC) 20 MG capsule Take 20 mg by mouth daily before breakfast.  Allergies:   Statins   Social History   Socioeconomic History  . Marital status: Widowed    Spouse name: Not on file  . Number of children: Not on file  . Years of education: Not on file  . Highest education level: Not on file  Occupational History  . Not on file  Social Needs  . Financial resource strain: Not on file  . Food insecurity:    Worry: Not on file    Inability: Not on file  . Transportation needs:    Medical: Not on file    Non-medical: Not on file  Tobacco Use  . Smoking status: Former Smoker    Last attempt to quit: 08/01/1986    Years since quitting: 31.3  . Smokeless tobacco: Never Used  Substance and Sexual  Activity  . Alcohol use: Yes    Alcohol/week: 4.0 standard drinks    Types: 4 Cans of beer per week  . Drug use: Never  . Sexual activity: Not on file  Lifestyle  . Physical activity:    Days per week: Not on file    Minutes per session: Not on file  . Stress: Not on file  Relationships  . Social connections:    Talks on phone: Not on file    Gets together: Not on file    Attends religious service: Not on file    Active member of club or organization: Not on file    Attends meetings of clubs or organizations: Not on file    Relationship status: Not on file  Other Topics Concern  . Not on file  Social History Narrative  . Not on file     Family History: The patient's family history includes Breast cancer in his mother; Colon cancer (age of onset: 7) in his father; Prostate cancer in his father. There is no history of Stomach cancer, Esophageal cancer, or Rectal cancer.  ROS:   Please see the history of present illness.     All other systems reviewed and are negative.  EKGs/Labs/Other Studies Reviewed:    The following studies were reviewed today: Bypass note, EKG, lab work, prior office notes  EKG:  EKG is ordered today.  The ekg ordered today demonstrates sinus rhythm T wave inversion noted in V2 V3 V4.  No significant change from prior EKG personally reviewed and interpreted.  Recent Labs: 10/02/2017: ALT 21 10/04/2017: Magnesium 2.2 10/22/2017: BUN 19; Creatinine, Ser 0.76; Hemoglobin 10.6; Platelets 566; Potassium 4.8; Sodium 138  Recent Lipid Panel No results found for: CHOL, TRIG, HDL, CHOLHDL, VLDL, LDLCALC, LDLDIRECT  Physical Exam:    VS:  BP 110/60   Pulse 61   Ht 5\' 11"  (1.803 m)   Wt 184 lb 1.9 oz (83.5 kg)   BMI 25.68 kg/m     Wt Readings from Last 3 Encounters:  11/15/17 184 lb 1.9 oz (83.5 kg)  11/11/17 185 lb (83.9 kg)  10/22/17 182 lb 1.9 oz (82.6 kg)     GEN:  Well nourished, well developed in no acute distress HEENT: Normal NECK: No JVD; No  carotid bruits LYMPHATICS: No lymphadenopathy CARDIAC: RRR, no murmurs, rubs, gallops, CABG scar RESPIRATORY:  Clear to auscultation without rales, wheezing or rhonchi  ABDOMEN: Soft, non-tender, non-distended MUSCULOSKELETAL:  No edema; No deformity  SKIN: Warm and dry NEUROLOGIC:  Alert and oriented x 3 PSYCHIATRIC:  Normal affect   ASSESSMENT:    1. S/P CABG x 4   2. Mixed hyperlipidemia   3.  Ischemic heart disease   4. Bilateral carotid artery stenosis    PLAN:    In order of problems listed above:  CAD post CABG 10/03/17 x 4 -Doing very well.  Cardiac rehab.  Continue with aggressive secondary prevention.  Aspirin.    Hyperlipidemia - Has had trouble with statin intolerance, Crestor.  Just started Repatha.  Will be following in our lipid clinic.  Essential hypertension - Doing very well.  No changes made.  Medications reviewed.  Bilateral carotid artery stenosis - Mild less than 39%.  Consider repeating study in about 2 years.  Continue with aggressive risk factor modification.  He is going to be starting cardiac rehab soon.  Excellent.  62-month follow-up with Cecille Rubin, 6 months with me  Medication Adjustments/Labs and Tests Ordered: Current medicines are reviewed at length with the patient today.  Concerns regarding medicines are outlined above.  Orders Placed This Encounter  Procedures  . EKG 12-Lead   No orders of the defined types were placed in this encounter.   Patient Instructions  Medication Instructions:  The current medical regimen is effective;  continue present plan and medications.  If you need a refill on your cardiac medications before your next appointment, please call your pharmacy.   Follow-Up: At [facility], you and your health needs are our priority.  As part of our continuing mission to provide you with exceptional heart care, we have created designated Provider Care Teams.  These Care Teams include your primary Cardiologist (physician) and  Advanced Practice Providers (APPs -  Physician Assistants and Nurse Practitioners) who all work together to provide you with the care you need, when you need it. You will need a follow up appointment in 3 months with Truitt Merle, NP and Dr Marlou Porch in 6 months.  Please call our office 2 months in advance to schedule this appointment.  You may see Dr Candee Furbish or one of the following Advanced Practice Providers on your designated Care Team:   Truitt Merle, NP Cecilie Kicks, NP . Kathyrn Drown, NP  Thank you for choosing [facility]!!          Signed, Candee Furbish, MD  11/15/2017 8:42 AM    [place]

## 2017-11-15 ENCOUNTER — Encounter: Payer: Self-pay | Admitting: Cardiology

## 2017-11-15 ENCOUNTER — Ambulatory Visit: Payer: Medicare Other | Admitting: Cardiology

## 2017-11-15 VITALS — BP 110/60 | HR 61 | Ht 71.0 in | Wt 184.1 lb

## 2017-11-15 DIAGNOSIS — Z951 Presence of aortocoronary bypass graft: Secondary | ICD-10-CM | POA: Diagnosis not present

## 2017-11-15 DIAGNOSIS — I259 Chronic ischemic heart disease, unspecified: Secondary | ICD-10-CM | POA: Diagnosis not present

## 2017-11-15 DIAGNOSIS — E782 Mixed hyperlipidemia: Secondary | ICD-10-CM

## 2017-11-15 DIAGNOSIS — I6523 Occlusion and stenosis of bilateral carotid arteries: Secondary | ICD-10-CM

## 2017-11-15 NOTE — Patient Instructions (Signed)
Medication Instructions:  The current medical regimen is effective;  continue present plan and medications.  If you need a refill on your cardiac medications before your next appointment, please call your pharmacy.   Follow-Up: At [facility], you and your health needs are our priority.  As part of our continuing mission to provide you with exceptional heart care, we have created designated Provider Care Teams.  These Care Teams include your primary Cardiologist (physician) and Advanced Practice Providers (APPs -  Physician Assistants and Nurse Practitioners) who all work together to provide you with the care you need, when you need it. You will need a follow up appointment in 3 months with Truitt Merle, NP and Dr Marlou Porch in 6 months.  Please call our office 2 months in advance to schedule this appointment.  You may see Dr Candee Furbish or one of the following Advanced Practice Providers on your designated Care Team:   Truitt Merle, NP Cecilie Kicks, NP . Kathyrn Drown, NP  Thank you for choosing [facility]!!

## 2017-11-26 ENCOUNTER — Telehealth (HOSPITAL_COMMUNITY): Payer: Self-pay

## 2017-11-28 ENCOUNTER — Other Ambulatory Visit: Payer: Self-pay | Admitting: Physician Assistant

## 2017-12-09 NOTE — Progress Notes (Signed)
Jeff Decker 72 y.o. male DOB 1945/06/10 MRN [id number]       Nutrition  No diagnosis found. Past Medical History:  Diagnosis Date  . Allergy   . Arthritis   . Cancer ([place]) 03/2016   prostate and bone , chemo completed  . Coronary artery disease   . GERD (gastroesophageal reflux disease)   . Myocardial infarction ([place]) 1989   Meds reviewed.     Current Outpatient Medications (Cardiovascular):  Marland Kitchen  Evolocumab (REPATHA SURECLICK) 785 MG/ML SOAJ, Inject 1 pen into the skin every 14 (fourteen) days. .  metoprolol tartrate (LOPRESSOR) 25 MG tablet, Take 0.5 tablets (12.5 mg total) by mouth 2 (two) times daily.  Current Outpatient Medications (Respiratory):  .  fexofenadine (ALLEGRA) 180 MG tablet, Take 180 mg by mouth daily.  Current Outpatient Medications (Analgesics):  .  acetaminophen (TYLENOL) 325 MG tablet, Take 2 tablets (650 mg total) by mouth every 6 (six) hours as needed for mild pain. Marland Kitchen  aspirin EC 81 MG tablet, Take 81 mg by mouth daily.   Current Outpatient Medications (Other):  Marland Kitchen  Calcium Carb-Cholecalciferol (CALCIUM 600+D) 600-800 MG-UNIT TABS, Take 1 tablet by mouth daily.  Marland Kitchen  glucosamine-chondroitin 500-400 MG tablet, Take 1 tablet by mouth daily.  .  Multiple Vitamin (MULTI-VITAMINS) TABS, Take by mouth. Marland Kitchen  omeprazole (PRILOSEC) 20 MG capsule, Take 20 mg by mouth daily before breakfast.   HT: Ht Readings from Last 1 Encounters:  11/15/17 5\' 11"  (1.803 m)    WT: Wt Readings from Last 5 Encounters:  11/15/17 184 lb 1.9 oz (83.5 kg)  11/11/17 185 lb (83.9 kg)  10/22/17 182 lb 1.9 oz (82.6 kg)  10/07/17 182 lb 1.6 oz (82.6 kg)  09/24/17 191 lb 3.2 oz (86.7 kg)     BMI = 25.69 (11/15/17)   Current tobacco use? No       Labs:  Lipid Panel  No results found for: CHOL, TRIG, HDL, CHOLHDL, VLDL, LDLCALC, LDLDIRECT  Lab Results  Component Value Date   HGBA1C 5.7 (H) 10/02/2017   CBG (last 3)  No results for input(s): GLUCAP in the last 72  hours.  Nutrition Diagnosis ? Food-and nutrition-related knowledge deficit related to lack of exposure to information as related to diagnosis of: ? CVD ? Pre-diabetes ? Overweight  related to excessive energy intake as evidenced by a BMI 25.69  Nutrition Goal(s):  ? To be determined  Plan:  Pt to attend nutrition classes ? Nutrition I ? Nutrition II ? Portion Distortion  ? Diabetes Blitz ? Diabetes Q & A Will provide client-centered nutrition education as part of interdisciplinary care.   Monitor and evaluate progress toward nutrition goal with team.  Laurina Bustle, MS, RD, LDN 12/09/2017 11:11 AM

## 2017-12-10 ENCOUNTER — Other Ambulatory Visit: Payer: Self-pay | Admitting: Cardiology

## 2017-12-10 MED ORDER — METOPROLOL TARTRATE 25 MG PO TABS: 12.5000 mg | ORAL_TABLET | Freq: Two times a day (BID) | ORAL | 3 refills | Status: DC

## 2017-12-12 ENCOUNTER — Encounter (HOSPITAL_COMMUNITY): Payer: Self-pay

## 2017-12-12 ENCOUNTER — Encounter (HOSPITAL_COMMUNITY)
Admission: RE | Admit: 2017-12-12 | Discharge: 2017-12-12 | Disposition: A | Discharge status: Home / Self Care | Payer: Medicare Other | Source: Ambulatory Visit | Attending: Cardiology | Admitting: Cardiology

## 2017-12-12 VITALS — BP 104/72 | HR 55 | Ht 70.0 in | Wt 184.3 lb

## 2017-12-12 DIAGNOSIS — Z951 Presence of aortocoronary bypass graft: Secondary | ICD-10-CM | POA: Diagnosis not present

## 2017-12-12 HISTORY — DX: Hyperlipidemia, unspecified: E78.5

## 2017-12-12 NOTE — Progress Notes (Signed)
Cardiac Individual Treatment Plan  Patient Details  Name: Jeff Decker MRN: [id number] Date of Birth: 1945-06-13 Referring Provider:     CARDIAC REHAB PHASE II ORIENTATION from 12/12/2017 in [facility]  Referring Provider  Dr. 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  acetaminophen (TYLENOL) 325 MG tablet, Take 2 tablets (650 mg total) by mouth every 6 (six) hours as needed for mild pain., Disp: , Rfl:  .  aspirin EC 81 MG tablet, Take 81 mg by mouth daily., Disp: , Rfl:  .  Calcium Carb-Cholecalciferol (CALCIUM 600+D) 600-800 MG-UNIT TABS, Take 1 tablet by mouth daily. , Disp: , Rfl:  .  Evolocumab (REPATHA SURECLICK) 628 MG/ML SOAJ, Inject 1 pen into the skin every 14 (fourteen) days., Disp: 2 pen, Rfl: 11 .  fexofenadine (ALLEGRA) 180 MG tablet, Take 180 mg by mouth daily., Disp: , Rfl:  .  glucosamine-chondroitin 500-400 MG tablet, Take 1 tablet by mouth daily. , Disp: , Rfl:  .  metoprolol tartrate (LOPRESSOR) 25 MG tablet, Take 0.5 tablets (12.5 mg total) by mouth 2 (two) times daily., Disp: 90 tablet, Rfl: 3 .  Multiple Vitamin (MULTI-VITAMINS) TABS, Take by mouth., Disp: , Rfl:  .  omeprazole (PRILOSEC) 20 MG capsule, Take 20 mg by mouth daily before breakfast., Disp: , Rfl: 1  Past Medical History: Past Medical History:  Diagnosis Date  . Allergy   . Arthritis   . Cancer ([place]) 03/2016   prostate and bone , chemo completed  . Coronary artery disease   . GERD (gastroesophageal reflux disease)   . Hyperlipidemia   . Myocardial infarction ([place]) 1989    Tobacco Use: Social History   Tobacco Use  Smoking Status Former Smoker  . Last attempt to quit: 08/01/1986  . Years since quitting: 31.3   Smokeless Tobacco Never Used    Labs: Recent Chemical engineer    Labs for ITP Cardiac and Pulmonary Rehab Latest Ref Rng & Units 10/03/2017 10/03/2017 10/03/2017 10/03/2017 10/04/2017   Hemoglobin A1c 4.8 - 5.6 % - - - - -   PHART 7.350 - 7.450 7.354 7.447 7.319(L) - -   PCO2ART 32.0 - 48.0 mmHg 48.8(H) 35.6 49.5(H) - -   HCO3 20.0 - 28.0 mmol/L 27.0 24.4 25.2 - -   TCO2 22 - 32 mmol/L 28 25 27 25 24    ACIDBASEDEF 0.0 - 2.0 mmol/L - - 1.0 - -   O2SAT % 99.0 100.0 97.0 - -      Capillary Blood Glucose: Lab Results  Component Value Date   GLUCAP 122 (H) 10/05/2017   GLUCAP 98 10/05/2017   GLUCAP 114 (H) 10/04/2017   GLUCAP 136 (H) 10/04/2017   GLUCAP 119 (H) 10/04/2017     Exercise Target Goals: Exercise Program Goal: Individual exercise prescription set using results from initial 6 min walk test and THRR while considering  patient's activity barriers and safety.   Exercise Prescription Goal: Starting with aerobic activity 30 plus minutes a day, 3 days per week for initial exercise prescription. Provide home exercise prescription and guidelines that participant acknowledges understanding prior to discharge.  Activity Barriers & Risk Stratification: Activity Barriers & Cardiac Risk Stratification - 12/12/17 0944  Activity Barriers & Cardiac Risk Stratification   Activity Barriers  Deconditioning;Muscular Weakness    Cardiac Risk Stratification  High       6 Minute Walk: 6 Minute Walk    Row Name 12/12/17 0943         6 Minute Walk   Phase  Initial     Distance  1491 feet     Walk Time  6 minutes     # of Rest Breaks  0     MPH  2.82     METS  2.91     RPE  6     VO2 Peak  10.17     Symptoms  No     Resting HR  55 bpm     Resting BP  104/72     Resting Oxygen Saturation   99 %     Exercise Oxygen Saturation  during 6 min walk  100 %     Max Ex. HR  78 bpm     Max Ex. BP  120/74     2 Minute Post BP  102/70        Oxygen Initial Assessment:   Oxygen  Re-Evaluation:   Oxygen Discharge (Final Oxygen Re-Evaluation):   Initial Exercise Prescription: Initial Exercise Prescription - 12/12/17 1000      Date of Initial Exercise RX and Referring Provider   Date  12/13/18    Referring Provider  Dr. Marlou Porch    Expected Discharge Date  03/20/18      Treadmill   MPH  3    Grade  1    Minutes  10      Bike   Level  1    Minutes  10    METs  3.2      NuStep   Level  4    SPM  85    Minutes  10    METs  3      Prescription Details   Frequency (times per week)  3    Duration  Progress to 30 minutes of continuous aerobic without signs/symptoms of physical distress      Intensity   THRR 40-80% of Max Heartrate  59-118    Ratings of Perceived Exertion  11-13      Progression   Progression  Continue to progress workloads to maintain intensity without signs/symptoms of physical distress.      Resistance Training   Training Prescription  Yes    Weight  4 lbs.     Reps  10-15       Perform Capillary Blood Glucose checks as needed.  Exercise Prescription Changes:   Exercise Comments:   Exercise Goals and Review: Exercise Goals    Row Name 12/12/17 0945             Exercise Goals   Increase Physical Activity  Yes       Intervention  Provide advice, education, support and counseling about physical activity/exercise needs.;Develop an individualized exercise prescription for aerobic and resistive training based on initial evaluation findings, risk stratification, comorbidities and participant's personal goals.       Expected Outcomes  Short Term: Attend rehab on a regular basis to increase amount of physical activity.       Increase Strength and Stamina  Yes       Intervention  Provide advice, education, support and counseling about physical activity/exercise needs.;Develop an individualized exercise prescription for aerobic and resistive training based on initial evaluation  findings, risk stratification, comorbidities and  participant's personal goals.       Expected Outcomes  Short Term: Increase workloads from initial exercise prescription for resistance, speed, and METs.       Able to understand and use rate of perceived exertion (RPE) scale  Yes       Intervention  Provide education and explanation on how to use RPE scale       Expected Outcomes  Short Term: Able to use RPE daily in rehab to express subjective intensity level;Long Term:  Able to use RPE to guide intensity level when exercising independently       Knowledge and understanding of Target Heart Rate Range (THRR)  Yes       Intervention  Provide education and explanation of THRR including how the numbers were predicted and where they are located for reference       Expected Outcomes  Short Term: Able to state/look up THRR;Long Term: Able to use THRR to govern intensity when exercising independently;Short Term: Able to use daily as guideline for intensity in rehab       Able to check pulse independently  Yes       Intervention  Provide education and demonstration on how to check pulse in carotid and radial arteries.;Review the importance of being able to check your own pulse for safety during independent exercise       Expected Outcomes  Short Term: Able to explain why pulse checking is important during independent exercise;Long Term: Able to check pulse independently and accurately       Understanding of Exercise Prescription  Yes       Intervention  Provide education, explanation, and written materials on patient's individual exercise prescription       Expected Outcomes  Short Term: Able to explain program exercise prescription;Long Term: Able to explain home exercise prescription to exercise independently          Exercise Goals Re-Evaluation :    Discharge Exercise Prescription (Final Exercise Prescription Changes):   Nutrition:  Target Goals: Understanding of nutrition guidelines, daily intake of sodium 1500mg , cholesterol 200mg , calories  30% from fat and 7% or less from saturated fats, daily to have 5 or more servings of fruits and vegetables.  Biometrics: Pre Biometrics - 12/12/17 0945      Pre Biometrics   Height  5\' 10"  (1.778 m)    Weight  83.6 kg    Waist Circumference  39 inches    Hip Circumference  40 inches    Waist to Hip Ratio  0.98 %    BMI (Calculated)  26.44    Triceps Skinfold  22 mm    % Body Fat  28.2 %    Grip Strength  25 kg    Flexibility  10 in    Single Leg Stand  30 seconds        Nutrition Therapy Plan and Nutrition Goals: Nutrition Therapy & Goals - 12/12/17 1025      Nutrition Therapy   Diet  heart healthy      Personal Nutrition Goals   Nutrition Goal  Pt to identify and limit food sources of saturated fat, trans fat, refined carbohydrates and sodium    Personal Goal #2  Pt able to name foods that affect blood glucose      Intervention Plan   Intervention  Prescribe, educate and counsel regarding individualized specific dietary modifications aiming towards targeted core components such as weight, hypertension, lipid management, diabetes,  heart failure and other comorbidities.    Expected Outcomes  Short Term Goal: Understand basic principles of dietary content, such as calories, fat, sodium, cholesterol and nutrients.;Long Term Goal: Adherence to prescribed nutrition plan.       Nutrition Assessments: Nutrition Assessments - 12/12/17 1027      MEDFICTS Scores   Pre Score  41       Nutrition Goals Re-Evaluation:   Nutrition Goals Discharge (Final Nutrition Goals Re-Evaluation):   Psychosocial: Target Goals: Acknowledge presence or absence of significant depression and/or stress, maximize coping skills, provide positive support system. Participant is able to verbalize types and ability to use techniques and skills needed for reducing stress and depression.  Initial Review & Psychosocial Screening: Initial Psych Review & Screening - 12/12/17 1204      Initial Review    Current issues with  None Identified      Family Dynamics   Good Support System?  Yes      Barriers   Psychosocial barriers to participate in program  There are no identifiable barriers or psychosocial needs.      Screening Interventions   Interventions  Encouraged to exercise       Quality of Life Scores: Quality of Life - 12/12/17 0918      Quality of Life   Select  Quality of Life      Quality of Life Scores   Health/Function Pre  27.2 %    Socioeconomic Pre  28.93 %    Psych/Spiritual Pre  29.14 %    Family Pre  28.8 %    GLOBAL Pre  28.19 %      Scores of 19 and below usually indicate a poorer quality of life in these areas.  A difference of  2-3 points is a clinically meaningful difference.  A difference of 2-3 points in the total score of the Quality of Life Index has been associated with significant improvement in overall quality of life, self-image, physical symptoms, and general health in studies assessing change in quality of life.  PHQ-9: Recent Review Flowsheet Data    There is no flowsheet data to display.     Interpretation of Total Score  Total Score Depression Severity:  1-4 = Minimal depression, 5-9 = Mild depression, 10-14 = Moderate depression, 15-19 = Moderately severe depression, 20-27 = Severe depression   Psychosocial Evaluation and Intervention:   Psychosocial Re-Evaluation:   Psychosocial Discharge (Final Psychosocial Re-Evaluation):   Vocational Rehabilitation: Provide vocational rehab assistance to qualifying candidates.   Vocational Rehab Evaluation & Intervention: Vocational Rehab - 12/12/17 0919      Initial Vocational Rehab Evaluation & Intervention   Assessment shows need for Vocational Rehabilitation  No       Education: Education Goals: Education classes will be provided on a weekly basis, covering required topics. Participant will state understanding/return demonstration of topics presented.  Learning  Barriers/Preferences: Learning Barriers/Preferences - 12/12/17 0946      Learning Barriers/Preferences   Learning Barriers  Sight;Hearing    Learning Preferences  Pictoral;Written Material       Education Topics: Hypertension, Hypertension Reduction -Define heart disease and high blood pressure. Discus how high blood pressure affects the body and ways to reduce high blood pressure.   Exercise and Your Heart -Discuss why it is important to exercise, the FITT principles of exercise, normal and abnormal responses to exercise, and how to exercise safely.   Angina -Discuss definition of angina, causes of angina, treatment of angina, and how  to decrease risk of having angina.   Cardiac Medications -Review what the following cardiac medications are used for, how they affect the body, and side effects that may occur when taking the medications.  Medications include Aspirin, Beta blockers, calcium channel blockers, ACE Inhibitors, angiotensin receptor blockers, diuretics, digoxin, and antihyperlipidemics.   Congestive Heart Failure -Discuss the definition of CHF, how to live with CHF, the signs and symptoms of CHF, and how keep track of weight and sodium intake.   Heart Disease and Intimacy -Discus the effect sexual activity has on the heart, how changes occur during intimacy as we age, and safety during sexual activity.   Smoking Cessation / COPD -Discuss different methods to quit smoking, the health benefits of quitting smoking, and the definition of COPD.   Nutrition I: Fats -Discuss the types of cholesterol, what cholesterol does to the heart, and how cholesterol levels can be controlled.   Nutrition II: Labels -Discuss the different components of food labels and how to read food label   Heart Parts/Heart Disease and PAD -Discuss the anatomy of the heart, the pathway of blood circulation through the heart, and these are affected by heart disease.   Stress I: Signs and  Symptoms -Discuss the causes of stress, how stress may lead to anxiety and depression, and ways to limit stress.   Stress II: Relaxation -Discuss different types of relaxation techniques to limit stress.   Warning Signs of Stroke / TIA -Discuss definition of a stroke, what the signs and symptoms are of a stroke, and how to identify when someone is having stroke.   Knowledge Questionnaire Score: Knowledge Questionnaire Score - 12/12/17 0919      Knowledge Questionnaire Score   Pre Score  22/24       Core Components/Risk Factors/Patient Goals at Admission: Personal Goals and Risk Factors at Admission - 12/12/17 0947      Core Components/Risk Factors/Patient Goals on Admission    Weight Management  Yes;Weight Loss;Weight Maintenance    Intervention  Weight Management: Develop a combined nutrition and exercise program designed to reach desired caloric intake, while maintaining appropriate intake of nutrient and fiber, sodium and fats, and appropriate energy expenditure required for the weight goal.;Weight Management: Provide education and appropriate resources to help participant work on and attain dietary goals.;Weight Management/Obesity: Establish reasonable short term and long term weight goals.    Admit Weight  184 lb 4.9 oz (83.6 kg)    Expected Outcomes  Short Term: Continue to assess and modify interventions until short term weight is achieved;Long Term: Adherence to nutrition and physical activity/exercise program aimed toward attainment of established weight goal;Weight Maintenance: Understanding of the daily nutrition guidelines, which includes 25-35% calories from fat, 7% or less cal from saturated fats, less than 200mg  cholesterol, less than 1.5gm of sodium, & 5 or more servings of fruits and vegetables daily;Weight Loss: Understanding of general recommendations for a balanced deficit meal plan, which promotes 1-2 lb weight loss per week and includes a negative energy balance of  [phone] kcal/d;Understanding recommendations for meals to include 15-35% energy as protein, 25-35% energy from fat, 35-60% energy from carbohydrates, less than 200mg  of dietary cholesterol, 20-35 gm of total fiber daily;Understanding of distribution of calorie intake throughout the day with the consumption of 4-5 meals/snacks    Hypertension  Yes    Intervention  Provide education on lifestyle modifcations including regular physical activity/exercise, weight management, moderate sodium restriction and increased consumption of fresh fruit, vegetables, and low fat dairy, alcohol  moderation, and smoking cessation.;Monitor prescription use compliance.    Expected Outcomes  Short Term: Continued assessment and intervention until BP is < 140/32mm HG in hypertensive participants. < 130/20mm HG in hypertensive participants with diabetes, heart failure or chronic kidney disease.;Long Term: Maintenance of blood pressure at goal levels.    Lipids  Yes    Intervention  Provide education and support for participant on nutrition & aerobic/resistive exercise along with prescribed medications to achieve LDL 70mg , HDL >40mg .    Expected Outcomes  Short Term: Participant states understanding of desired cholesterol values and is compliant with medications prescribed. Participant is following exercise prescription and nutrition guidelines.;Long Term: Cholesterol controlled with medications as prescribed, with individualized exercise RX and with personalized nutrition plan. Value goals: LDL < 70mg , HDL > 40 mg.    Stress  Yes    Intervention  Offer individual and/or small group education and counseling on adjustment to heart disease, stress management and health-related lifestyle change. Teach and support self-help strategies.;Refer participants experiencing significant psychosocial distress to appropriate mental health specialists for further evaluation and treatment. When possible, include family members and significant  others in education/counseling sessions.    Expected Outcomes  Short Term: Participant demonstrates changes in health-related behavior, relaxation and other stress management skills, ability to obtain effective social support, and compliance with psychotropic medications if prescribed.;Long Term: Emotional wellbeing is indicated by absence of clinically significant psychosocial distress or social isolation.       Core Components/Risk Factors/Patient Goals Review:    Core Components/Risk Factors/Patient Goals at Discharge (Final Review):    ITP Comments: ITP Comments    Row Name 12/12/17 0918           ITP Comments  Dr. Fransico Him Medical Director          Comments: Delrae [name] attended orientation from [phone] to [phone] to review rules and guidelines for program. Completed 6 minute walk test, Intitial ITP, and exercise prescription.  VSS. Telemetry-Sinus Rhythm with an occasional PVC.  Asymptomatic.Barnet Pall, RN,BSN 12/12/2017 12:10 PM

## 2017-12-12 NOTE — Progress Notes (Signed)
Kip Cropp 72 y.o. male DOB: 26-Oct-1945 MRN: [id number]      Nutrition Note  1. S/P CABG x 4    Past Medical History:  Diagnosis Date  . Allergy   . Arthritis   . 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 Coronary artery disease   . GERD (gastroesophageal reflux disease)   . Myocardial infarction ([place]) 1989   Meds reviewed.    Current Outpatient Medications (Cardiovascular):  Marland Kitchen  Evolocumab (REPATHA SURECLICK) 081 MG/ML SOAJ, Inject 1 pen into the skin every 14 (fourteen) days. .  metoprolol tartrate (LOPRESSOR) 25 MG tablet, Take 0.5 tablets (12.5 mg total) by mouth 2 (two) times daily.  Current Outpatient Medications (Respiratory):  .  fexofenadine (ALLEGRA) 180 MG tablet, Take 180 mg by mouth daily.  Current Outpatient Medications (Analgesics):  .  acetaminophen (TYLENOL) 325 MG tablet, Take 2 tablets (650 mg total) by mouth every 6 (six) hours as needed for mild pain. Marland Kitchen  aspirin EC 81 MG tablet, Take 81 mg by mouth daily.   Current Outpatient Medications (Other):  Marland Kitchen  Calcium Carb-Cholecalciferol (CALCIUM 600+D) 600-800 MG-UNIT TABS, Take 1 tablet by mouth daily.  Marland Kitchen  glucosamine-chondroitin 500-400 MG tablet, Take 1 tablet by mouth daily.  .  Multiple Vitamin (MULTI-VITAMINS) TABS, Take by mouth. Marland Kitchen  omeprazole (PRILOSEC) 20 MG capsule, Take 20 mg by mouth daily before breakfast.   HT: Ht Readings from Last 1 Encounters:  12/12/17 5\' 10"  (1.778 m)    WT: Wt Readings from Last 5 Encounters:  12/12/17 184 lb 4.9 oz (83.6 kg)  11/15/17 184 lb 1.9 oz (83.5 kg)  11/11/17 185 lb (83.9 kg)  10/22/17 182 lb 1.9 oz (82.6 kg)  10/07/17 182 lb 1.6 oz (82.6 kg)     Body mass index is 26.44 kg/m.   Current tobacco use? No  Labs:  Lipid Panel  No results found for: CHOL, TRIG, HDL, CHOLHDL, VLDL, LDLCALC, LDLDIRECT  Lab Results  Component Value Date   HGBA1C 5.7 (H) 10/02/2017   CBG (last 3)  No results for input(s): GLUCAP in the last 72  hours.   Nutrition Diagnosis ? Food-and nutrition-related knowledge deficit related to lack of exposure to information as related to diagnosis of: ? CVD ? Pre-diabetes  Nutrition Goal(s):  ? Pt to identify and limit food sources of saturated fat, trans fat, refined carbohydrates and sodium ? Pt able to name foods that affect blood glucose.  Plan:  ? Pt to attend nutrition classes ? Nutrition I ? Nutrition II ? Portion Distortion  ? Diabetes Blitz ? Diabetes Q & Ae determined ? Will provide client-centered nutrition education as part of interdisciplinary care ? Monitor and evaluate progress toward nutrition goal with team.   Laurina Bustle, MS, RD, LDN 12/12/2017 10:25 AM

## 2017-12-12 NOTE — Progress Notes (Signed)
Cardiac Rehab Medication Review by a Nurse  Does the patient  feel that his/her medications are working for him/her?  yes  Has the patient been experiencing any side effects to the medications prescribed?  no  Does the patient measure his/her own blood pressure or blood glucose at home?  yes   Does the patient have any problems obtaining medications due to transportation or finances?   no  Understanding of regimen: good Understanding of indications: good Potential of compliance: good    Nurse comments: Jeff Decker has a good understanding of his medications and is taking them as prescribed. Jeff Decker has been taking Repatha for the past month and has not experienced any side effects so far.    Jeff Gave RN BSN 12/12/2017 9:32 AM

## 2017-12-16 ENCOUNTER — Other Ambulatory Visit: Payer: Medicare Other | Admitting: *Deleted

## 2017-12-16 DIAGNOSIS — E782 Mixed hyperlipidemia: Secondary | ICD-10-CM

## 2017-12-16 LAB — LIPID PANEL
Chol/HDL Ratio: 3.3 ratio (ref 0.0–5.0)
Cholesterol, Total: 130 mg/dL (ref 100–199)
HDL: 39 mg/dL — ABNORMAL LOW (ref >39)
LDL Calculated: 57 mg/dL (ref 0–99)
Triglycerides: 172 mg/dL — ABNORMAL HIGH (ref 0–149)
VLDL Cholesterol Cal: 34 mg/dL (ref 5–40)

## 2017-12-16 LAB — HEPATIC FUNCTION PANEL
ALT: 19 IU/L (ref 0–44)
AST: 15 IU/L (ref 0–40)
Albumin: 4.5 g/dL (ref 3.5–4.8)
Alkaline Phosphatase: 89 IU/L (ref 39–117)
Bilirubin Total: 0.3 mg/dL (ref 0.0–1.2)
Bilirubin, Direct: 0.1 mg/dL (ref 0.00–0.40)
Total Protein: 7.1 g/dL (ref 6.0–8.5)

## 2017-12-18 ENCOUNTER — Encounter (HOSPITAL_COMMUNITY)
Admission: RE | Admit: 2017-12-18 | Discharge: 2017-12-18 | Disposition: A | Discharge status: Home / Self Care | Payer: Medicare Other | Source: Ambulatory Visit | Attending: Cardiology | Admitting: Cardiology

## 2017-12-18 ENCOUNTER — Encounter (HOSPITAL_COMMUNITY): Payer: Self-pay

## 2017-12-18 ENCOUNTER — Encounter (HOSPITAL_COMMUNITY): Payer: Medicare Other

## 2017-12-18 DIAGNOSIS — Z951 Presence of aortocoronary bypass graft: Secondary | ICD-10-CM | POA: Diagnosis not present

## 2017-12-18 NOTE — Progress Notes (Signed)
Daily Session Note  Patient Details  Name: Jeff Decker MRN: [id number] Date of Birth: 06/01/1945 Referring Provider:   Flowsheet Row CARDIAC REHAB PHASE II ORIENTATION from 12/12/2017 in [place]  Referring Provider  Dr. Marlou Porch      Encounter Date: 12/18/2017  Check In: Session Check In - 12/18/17 0841    Check-In          Supervising physician immediately available to respond to emergencies  [facility] immediately available    Physician(s)  Dr. Maryland Pink    Staff Present  Jiles Garter, RN, BSN;Brittany Durene Fruits, BS, ACSM CEP, Exercise Physiologist;Tyara Carol Ada, MS,ACSM CEP, Exercise Physiologist;Joann Rion, RN, BSN    Medication changes reported      No    Fall or balance concerns reported     No    Tobacco Cessation  No Change    Warm-up and Cool-down  Performed as group-led instruction    Resistance Training Performed  No    VAD Patient?  No    PAD/SET Patient?  No        Pain Assessment          Currently in Pain?  No/denies    Multiple Pain Sites  No           Capillary Blood Glucose: No results found for this or any previous visit (from the past 24 hour(s)).  Exercise Prescription Changes - 12/18/17 0934    Response to Exercise          Blood Pressure (Admit)  118/72    Blood Pressure (Exercise)  126/70    Blood Pressure (Exit)  108/60    Heart Rate (Admit)  84 bpm    Heart Rate (Exercise)  96 bpm    Heart Rate (Exit)  75 bpm    Rating of Perceived Exertion (Exercise)  8    Comments  Pt first day of exercise.     Duration  Continue with 30 min of aerobic exercise without signs/symptoms of physical distress.    Intensity  THRR unchanged        Progression          Progression  Continue to progress workloads to maintain intensity without signs/symptoms of physical distress.    Average METs  3.4        Resistance Training          Training Prescription  No        Treadmill          MPH  3    Grade  1     Minutes  10        Bike          Level  1    Minutes  10    METs  3.27        NuStep          Level  4    SPM  85    Minutes  10    METs  3.2           Social History   Tobacco Use  Smoking Status Former Smoker  . Last attempt to quit: 08/01/1986  . Years since quitting: 31.4  Smokeless Tobacco Never Used    Goals Met:  Exercise tolerated well  Goals Unmet:  Not Applicable  Comments: Pt started cardiac rehab today.  Pt tolerated light exercise without difficulty. VSS, telemetry-sinus rhythm,  asymptomatic.  Medication list reconciled. Pt denies barriers to  medicaiton compliance.  PSYCHOSOCIAL ASSESSMENT:  PHQ-0.  Pt exhibits positive coping skills, hopeful outlook with supportive family. No psychosocial needs identified at this time, no psychosocial interventions necessary. Pt oriented to exercise equipment and routine.  Understanding verbalized.  Andi Hence, RN, BSN Cardiac Pulmonary Rehab    Dr. Fransico Him is Medical Director for Cardiac Rehab at [facility].

## 2017-12-19 NOTE — Progress Notes (Signed)
Cardiac Individual Treatment Plan  Patient Details  Name: Jeff Decker MRN: [id number] Date of Birth: 05-31-1945 Referring Provider:   Flowsheet Row CARDIAC REHAB PHASE II ORIENTATION from 12/12/2017 in [place]  Referring Provider  Dr. 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  acetaminophen (TYLENOL) 325 MG tablet, Take 2 tablets (650 mg total) by mouth every 6 (six) hours as needed for mild pain., Disp: , Rfl:  .  aspirin EC 81 MG tablet, Take 81 mg by mouth daily., Disp: , Rfl:  .  Calcium Carb-Cholecalciferol (CALCIUM 600+D) 600-800 MG-UNIT TABS, Take 1 tablet by mouth daily. , Disp: , Rfl:  .  Evolocumab (REPATHA SURECLICK) 326 MG/ML SOAJ, Inject 1 pen into the skin every 14 (fourteen) days., Disp: 2 pen, Rfl: 11 .  fexofenadine (ALLEGRA) 180 MG tablet, Take 180 mg by mouth daily., Disp: , Rfl:  .  glucosamine-chondroitin 500-400 MG tablet, Take 1 tablet by mouth daily. , Disp: , Rfl:  .  metoprolol tartrate (LOPRESSOR) 25 MG tablet, Take 0.5 tablets (12.5 mg total) by mouth 2 (two) times daily., Disp: 90 tablet, Rfl: 3 .  Multiple Vitamin (MULTI-VITAMINS) TABS, Take by mouth., Disp: , Rfl:  .  omeprazole (PRILOSEC) 20 MG capsule, Take 20 mg by mouth daily before breakfast., Disp: , Rfl: 1  Past Medical History: Past Medical History:  Diagnosis Date  . Allergy   . Arthritis   . Cancer ([place]) 03/2016   prostate and bone , chemo completed  . Coronary artery disease   . GERD (gastroesophageal reflux disease)   . Hyperlipidemia   . Myocardial infarction ([place]) 1989    Tobacco Use: Social History   Tobacco Use  Smoking Status Former Smoker  . Last attempt to quit: 08/01/1986  . Years since  quitting: 31.4  Smokeless Tobacco Never Used    Labs: Recent Review Flowsheet Data    Labs for ITP Cardiac and Pulmonary Rehab Latest Ref Rng & Units 10/03/2017 10/03/2017 10/03/2017 10/04/2017 12/16/2017   Cholestrol 100 - 199 mg/dL - - - - 130   LDLCALC 0 - 99 mg/dL - - - - 57   HDL >39 mg/dL - - - - 39(L)   Trlycerides 0 - 149 mg/dL - - - - 172(H)   Hemoglobin A1c 4.8 - 5.6 % - - - - -   PHART 7.350 - 7.450 7.447 7.319(L) - - -   PCO2ART 32.0 - 48.0 mmHg 35.6 49.5(H) - - -   HCO3 20.0 - 28.0 mmol/L 24.4 25.2 - - -   TCO2 22 - 32 mmol/L 25 27 25 24  -   ACIDBASEDEF 0.0 - 2.0 mmol/L - 1.0 - - -   O2SAT % 100.0 97.0 - - -      Capillary Blood Glucose: Lab Results  Component Value Date   GLUCAP 122 (H) 10/05/2017   GLUCAP 98 10/05/2017   GLUCAP 114 (H) 10/04/2017   GLUCAP 136 (H) 10/04/2017   GLUCAP 119 (H) 10/04/2017     Exercise Target Goals: Exercise Program Goal: Individual exercise prescription set using results from initial 6 min walk test and THRR while considering  patient's activity barriers and safety.   Exercise Prescription Goal: Initial  exercise prescription builds to 30-45 minutes a day of aerobic activity, 2-3 days per week.  Home exercise guidelines will be given to patient during program as part of exercise prescription that the participant will acknowledge.  Activity Barriers & Risk Stratification: Activity Barriers & Cardiac Risk Stratification - 12/12/17 0944    Activity Barriers & Cardiac Risk Stratification          Activity Barriers  Deconditioning;Muscular Weakness    Cardiac Risk Stratification  High           6 Minute Walk: 6 Minute Walk    6 Minute Walk    Row Name 12/12/17 0943   Phase  Initial   Distance  1491 feet   Walk Time  6 minutes   # of Rest Breaks  0   MPH  2.82   METS  2.91   RPE  6   VO2 Peak  10.17   Symptoms  No   Resting HR  55 bpm   Resting BP  104/72   Resting Oxygen Saturation   99 %   Exercise Oxygen Saturation   during 6 min walk  100 %   Max Ex. HR  78 bpm   Max Ex. BP  120/74   2 Minute Post BP  102/70          Oxygen Initial Assessment:   Oxygen Re-Evaluation:   Oxygen Discharge (Final Oxygen Re-Evaluation):   Initial Exercise Prescription: Initial Exercise Prescription - 12/12/17 1000    Date of Initial Exercise RX and Referring Provider          Date  12/13/18    Referring Provider  Dr. Marlou Porch    Expected Discharge Date  03/20/18        Treadmill          MPH  3    Grade  1    Minutes  10        Bike          Level  1    Minutes  10    METs  3.2        NuStep          Level  4    SPM  85    Minutes  10    METs  3        Prescription Details          Frequency (times per week)  3    Duration  Progress to 30 minutes of continuous aerobic without signs/symptoms of physical distress        Intensity          THRR 40-80% of Max Heartrate  59-118    Ratings of Perceived Exertion  11-13        Progression          Progression  Continue to progress workloads to maintain intensity without signs/symptoms of physical distress.        Resistance Training          Training Prescription  Yes    Weight  4 lbs.     Reps  10-15           Perform Capillary Blood Glucose checks as needed.  Exercise Prescription Changes: Exercise Prescription Changes    Response to Exercise    Row Name 12/18/17 0934   Blood Pressure (Admit)  118/72   Blood Pressure (Exercise)  126/70   Blood Pressure (Exit)  108/60   Heart Rate (  Admit)  84 bpm   Heart Rate (Exercise)  96 bpm   Heart Rate (Exit)  75 bpm   Rating of Perceived Exertion (Exercise)  8   Comments  Pt first day of exercise.    Duration  Continue with 30 min of aerobic exercise without signs/symptoms of physical distress.   Intensity  THRR unchanged       Progression    Row Name 12/18/17 0934   Progression  Continue to progress workloads to maintain intensity without signs/symptoms of physical  distress.   Average METs  3.4       Resistance Training    Row Name 12/18/17 0934   Training Prescription  No       Treadmill    Row Name 12/18/17 0934   MPH  3   Grade  1   Minutes  [place] Name 12/18/17 0934   Level  1   Minutes  10   METs  3.27       NuStep    Row Name 12/18/17 0934   Level  4   SPM  85   Minutes  10   METs  3.2          Exercise Comments: Exercise Comments    Row Name 12/18/17 3016   Exercise Comments  Pt first day of exercise. Tolerated exercise well and will continue exercising at home in addition to cardiac rehab.       Exercise Goals and Review: Exercise Goals    Exercise Goals    Row Name 12/12/17 0945   Increase Physical Activity  Yes   Intervention  Provide advice, education, support and counseling about physical activity/exercise needs.;Develop an individualized exercise prescription for aerobic and resistive training based on initial evaluation findings, risk stratification, comorbidities and participant's personal goals.   Expected Outcomes  Short Term: Attend rehab on a regular basis to increase amount of physical activity.   Increase Strength and Stamina  Yes   Intervention  Provide advice, education, support and counseling about physical activity/exercise needs.;Develop an individualized exercise prescription for aerobic and resistive training based on initial evaluation findings, risk stratification, comorbidities and participant's personal goals.   Expected Outcomes  Short Term: Increase workloads from initial exercise prescription for resistance, speed, and METs.   Able to understand and use rate of perceived exertion (RPE) scale  Yes   Intervention  Provide education and explanation on how to use RPE scale   Expected Outcomes  Short Term: Able to use RPE daily in rehab to express subjective intensity level;Long Term:  Able to use RPE to guide intensity level when exercising independently   Knowledge and  understanding of Target Heart Rate Range (THRR)  Yes   Intervention  Provide education and explanation of THRR including how the numbers were predicted and where they are located for reference   Expected Outcomes  Short Term: Able to state/look up THRR;Long Term: Able to use THRR to govern intensity when exercising independently;Short Term: Able to use daily as guideline for intensity in rehab   Able to check pulse independently  Yes   Intervention  Provide education and demonstration on how to check pulse in carotid and radial arteries.;Review the importance of being able to check your own pulse for safety during independent exercise   Expected Outcomes  Short Term: Able to explain why pulse checking is important during independent exercise;Long Term: Able to check pulse independently and  accurately   Understanding of Exercise Prescription  Yes   Intervention  Provide education, explanation, and written materials on patient's individual exercise prescription   Expected Outcomes  Short Term: Able to explain program exercise prescription;Long Term: Able to explain home exercise prescription to exercise independently          Exercise Goals Re-Evaluation : Exercise Goals Re-Evaluation    Exercise Goal Re-Evaluation    Row Name 12/18/17 0936   Exercise Goals Review  Increase Physical Activity;Increase Strength and Stamina;Able to check pulse independently;Understanding of Exercise Prescription;Able to understand and use rate of perceived exertion (RPE) scale;Knowledge and understanding of Target Heart Rate Range (THRR)   Comments  Pt first day of exercise. Pt tolerated exercise well and had an average MET level of 3.4. 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    Expected Outcomes  Will continue to monitor and progress Pt as tolerated.           Discharge Exercise Prescription (Final Exercise Prescription Changes): Exercise Prescription Changes -  12/18/17 0934    Response to Exercise          Blood Pressure (Admit)  118/72    Blood Pressure (Exercise)  126/70    Blood Pressure (Exit)  108/60    Heart Rate (Admit)  84 bpm    Heart Rate (Exercise)  96 bpm    Heart Rate (Exit)  75 bpm    Rating of Perceived Exertion (Exercise)  8    Comments  Pt first day of exercise.     Duration  Continue with 30 min of aerobic exercise without signs/symptoms of physical distress.    Intensity  THRR unchanged        Progression          Progression  Continue to progress workloads to maintain intensity without signs/symptoms of physical distress.    Average METs  3.4        Resistance Training          Training Prescription  No        Treadmill          MPH  3    Grade  1    Minutes  10        Bike          Level  1    Minutes  10    METs  3.27        NuStep          Level  4    SPM  85    Minutes  10    METs  3.2           Nutrition:  Target Goals: Understanding of nutrition guidelines, daily intake of sodium <1513m, cholesterol <2073m calories 30% from fat and 7% or less from saturated fats, daily to have 5 or more servings of fruits and vegetables.  Biometrics: Pre Biometrics - 12/12/17 0945    Pre Biometrics          Height  5' 10"  (1.778 m)    Weight  83.6 kg    Waist Circumference  39 inches    Hip Circumference  40 inches    Waist to Hip Ratio  0.98 %    BMI (Calculated)  26.44    Triceps Skinfold  22 mm    % Body Fat  28.2 %    Grip Strength  25 kg  Flexibility  10 in    Single Leg Stand  30 seconds            Nutrition Therapy Plan and Nutrition Goals: Nutrition Therapy & Goals - 12/12/17 1025    Nutrition Therapy          Diet  heart healthy        Personal Nutrition Goals          Nutrition Goal  Pt to identify and limit food sources of saturated fat, trans fat, refined carbohydrates and sodium    Personal Goal #2  Pt able to name foods that affect blood glucose         Intervention Plan          Intervention  Prescribe, educate and counsel regarding individualized specific dietary modifications aiming towards targeted core components such as weight, hypertension, lipid management, diabetes, heart failure and other comorbidities.    Expected Outcomes  Short Term Goal: Understand basic principles of dietary content, such as calories, fat, sodium, cholesterol and nutrients.;Long Term Goal: Adherence to prescribed nutrition plan.           Nutrition Assessments: Nutrition Assessments - 12/12/17 1027    MEDFICTS Scores          Pre Score  41           Nutrition Goals Re-Evaluation:   Nutrition Goals Re-Evaluation:   Nutrition Goals Discharge (Final Nutrition Goals Re-Evaluation):   Psychosocial: Target Goals: Acknowledge presence or absence of significant depression and/or stress, maximize coping skills, provide positive support system. Participant is able to verbalize types and ability to use techniques and skills needed for reducing stress and depression.  Initial Review & Psychosocial Screening: Initial Psych Review & Screening - 12/12/17 1204    Initial Review          Current issues with  None Identified        Family Dynamics          Good Support System?  Yes        Barriers          Psychosocial barriers to participate in program  There are no identifiable barriers or psychosocial needs.        Screening Interventions          Interventions  Encouraged to exercise           Quality of Life Scores: Quality of Life - 12/12/17 0918    Quality of Life          Select  Quality of Life        Quality of Life Scores          Health/Function Pre  27.2 %    Socioeconomic Pre  28.93 %    Psych/Spiritual Pre  29.14 %    Family Pre  28.8 %    GLOBAL Pre  28.19 %          Scores of 19 and below usually indicate a poorer quality of life in these areas.  A difference of  2-3 points is a clinically meaningful difference.   A difference of 2-3 points in the total score of the Quality of Life Index has been associated with significant improvement in overall quality of life, self-image, physical symptoms, and general health in studies assessing change in quality of life.  PHQ-9: Recent Review Flowsheet Data    Depression screen [facility] 2/9 12/18/2017   Decreased Interest 0   Down, Depressed, Hopeless 0  PHQ - 2 Score 0     Interpretation of Total Score  Total Score Depression Severity:  1-4 = Minimal depression, 5-9 = Mild depression, 10-14 = Moderate depression, 15-19 = Moderately severe depression, 20-27 = Severe depression   Psychosocial Evaluation and Intervention: Psychosocial Evaluation - 12/18/17 0934    Psychosocial Evaluation & Interventions          Interventions  Encouraged to exercise with the program and follow exercise prescription    Comments  no psychosocial needs identified, no interventions necessary.  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      Expected Outcomes  pt will exhibit positive outlook with good coping skills    Continue Psychosocial Services   No Follow up required           Psychosocial Re-Evaluation:   Psychosocial Discharge (Final Psychosocial Re-Evaluation):   Vocational Rehabilitation: Provide vocational rehab assistance to qualifying candidates.   Vocational Rehab Evaluation & Intervention: Vocational Rehab - 12/12/17 0919    Initial Vocational Rehab Evaluation & Intervention          Assessment shows need for Vocational Rehabilitation  No           Education: Education Goals: Education classes will be provided on a weekly basis, covering required topics. Participant will state understanding/return demonstration of topics presented.  Learning Barriers/Preferences: Learning Barriers/Preferences - 12/12/17 0946    Learning Barriers/Preferences          Learning Barriers  Sight;Hearing    Learning Preferences  Pictoral;Written Material            Education Topics: Count Your Pulse:  -Group instruction provided by verbal instruction, demonstration, patient participation and written materials to support subject.  Instructors address importance of being able to find your pulse and how to count your pulse when at home without a heart monitor.  Patients get hands on experience counting their pulse with staff help and individually.   Heart Attack, Angina, and Risk Factor Modification:  -Group instruction provided by verbal instruction, video, and written materials to support subject.  Instructors address signs and symptoms of angina and heart attacks.    Also discuss risk factors for heart disease and how to make changes to improve heart health risk factors.   Functional Fitness:  -Group instruction provided by verbal instruction, demonstration, patient participation, and written materials to support subject.  Instructors address safety measures for doing things around the house.  Discuss how to get up and down off the floor, how to pick things up properly, how to safely get out of a chair without assistance, and balance training.   Meditation and Mindfulness:  -Group instruction provided by verbal instruction, patient participation, and written materials to support subject.  Instructor addresses importance of mindfulness and meditation practice to help reduce stress and improve awareness.  Instructor also leads participants through a meditation exercise.    Stretching for Flexibility and Mobility:  -Group instruction provided by verbal instruction, patient participation, and written materials to support subject.  Instructors lead participants through series of stretches that are designed to increase flexibility thus improving mobility.  These stretches are additional exercise for major muscle groups that are typically performed during regular warm up and cool down.   Hands Only CPR:  -Group verbal, video, and participation  provides a basic overview of AHA guidelines for community CPR. Role-play of emergencies allow participants the opportunity to practice calling for help and chest compression technique with discussion of AED  use.   Hypertension: -Group verbal and written instruction that provides a basic overview of hypertension including the most recent diagnostic guidelines, risk factor reduction with self-care instructions and medication management. Flowsheet Row CARDIAC REHAB PHASE II EXERCISE from 12/18/2017 in [place]  Date  12/18/17  Educator  RN  Instruction Review Code  2- Demonstrated Understanding       Nutrition I class: Heart Healthy Eating:  -Group instruction provided by PowerPoint slides, verbal discussion, and written materials to support subject matter. The instructor gives an explanation and review of the Therapeutic Lifestyle Changes diet recommendations, which includes a discussion on lipid goals, dietary fat, sodium, fiber, plant stanol/sterol esters, sugar, and the components of a well-balanced, healthy diet.   Nutrition II class: Lifestyle Skills:  -Group instruction provided by PowerPoint slides, verbal discussion, and written materials to support subject matter. The instructor gives an explanation and review of label reading, grocery shopping for heart health, heart healthy recipe modifications, and ways to make healthier choices when eating out.   Diabetes Question & Answer:  -Group instruction provided by PowerPoint slides, verbal discussion, and written materials to support subject matter. The instructor gives an explanation and review of diabetes co-morbidities, pre- and post-prandial blood glucose goals, pre-exercise blood glucose goals, signs, symptoms, and treatment of hypoglycemia and hyperglycemia, and foot care basics.   Diabetes Blitz:  -Group instruction provided by PowerPoint slides, verbal discussion, and written materials to support  subject matter. The instructor gives an explanation and review of the physiology behind type 1 and type 2 diabetes, diabetes medications and rational behind using different medications, pre- and post-prandial blood glucose recommendations and Hemoglobin A1c goals, diabetes diet, and exercise including blood glucose guidelines for exercising safely.    Portion Distortion:  -Group instruction provided by PowerPoint slides, verbal discussion, written materials, and food models to support subject matter. The instructor gives an explanation of serving size versus portion size, changes in portions sizes over the last 20 years, and what consists of a serving from each food group.   Stress Management:  -Group instruction provided by verbal instruction, video, and written materials to support subject matter.  Instructors review role of stress in heart disease and how to cope with stress positively.     Exercising on Your Own:  -Group instruction provided by verbal instruction, power point, and written materials to support subject.  Instructors discuss benefits of exercise, components of exercise, frequency and intensity of exercise, and end points for exercise.  Also discuss use of nitroglycerin and activating EMS.  Review options of places to exercise outside of rehab.  Review guidelines for sex with heart disease.   Cardiac Drugs I:  -Group instruction provided by verbal instruction and written materials to support subject.  Instructor reviews cardiac drug classes: antiplatelets, anticoagulants, beta blockers, and statins.  Instructor discusses reasons, side effects, and lifestyle considerations for each drug class.   Cardiac Drugs II:  -Group instruction provided by verbal instruction and written materials to support subject.  Instructor reviews cardiac drug classes: angiotensin converting enzyme inhibitors (ACE-I), angiotensin II receptor blockers (ARBs), nitrates, and calcium channel blockers.   Instructor discusses reasons, side effects, and lifestyle considerations for each drug class.   Anatomy and Physiology of the Circulatory System:  Group verbal and written instruction and models provide basic cardiac anatomy and physiology, with the coronary electrical and arterial systems. Review of: AMI, Angina, Valve disease, Heart Failure, Peripheral Artery Disease, Cardiac Arrhythmia, Pacemakers, and the ICD.  Other Education:  -Group or individual verbal, written, or video instructions that support the educational goals of the cardiac rehab program.   Holiday Eating Survival Tips:  -Group instruction provided by PowerPoint slides, verbal discussion, and written materials to support subject matter. The instructor gives patients tips, tricks, and techniques to help them not only survive but enjoy the holidays despite the onslaught of food that accompanies the holidays.   Knowledge Questionnaire Score: Knowledge Questionnaire Score - 12/12/17 0919    Knowledge Questionnaire Score          Pre Score  22/24           Core Components/Risk Factors/Patient Goals at Admission: Personal Goals and Risk Factors at Admission - 12/12/17 0947    Core Components/Risk Factors/Patient Goals on Admission           Weight Management  Yes;Weight Loss;Weight Maintenance    Intervention  Weight Management: Develop a combined nutrition and exercise program designed to reach desired caloric intake, while maintaining appropriate intake of nutrient and fiber, sodium and fats, and appropriate energy expenditure required for the weight goal.;Weight Management: Provide education and appropriate resources to help participant work on and attain dietary goals.;Weight Management/Obesity: Establish reasonable short term and long term weight goals.    Admit Weight  184 lb 4.9 oz (83.6 kg)    Expected Outcomes  Short Term: Continue to assess and modify interventions until short term weight is achieved;Long Term:  Adherence to nutrition and physical activity/exercise program aimed toward attainment of established weight goal;Weight Maintenance: Understanding of the daily nutrition guidelines, which includes 25-35% calories from fat, 7% or less cal from saturated fats, less than 231m cholesterol, less than 1.5gm of sodium, & 5 or more servings of fruits and vegetables daily;Weight Loss: Understanding of general recommendations for a balanced deficit meal plan, which promotes 1-2 lb weight loss per week and includes a negative energy balance of [phone] kcal/d;Understanding recommendations for meals to include 15-35% energy as protein, 25-35% energy from fat, 35-60% energy from carbohydrates, less than 2051mof dietary cholesterol, 20-35 gm of total fiber daily;Understanding of distribution of calorie intake throughout the day with the consumption of 4-5 meals/snacks    Hypertension  Yes    Intervention  Provide education on lifestyle modifcations including regular physical activity/exercise, weight management, moderate sodium restriction and increased consumption of fresh fruit, vegetables, and low fat dairy, alcohol moderation, and smoking cessation.;Monitor prescription use compliance.    Expected Outcomes  Short Term: Continued assessment and intervention until BP is < 140/9019mG in hypertensive participants. < 130/107m64m in hypertensive participants with diabetes, heart failure or chronic kidney disease.;Long Term: Maintenance of blood pressure at goal levels.    Lipids  Yes    Intervention  Provide education and support for participant on nutrition & aerobic/resistive exercise along with prescribed medications to achieve LDL <70mg74mL >40mg.19mExpected Outcomes  Short Term: Participant states understanding of desired cholesterol values and is compliant with medications prescribed. Participant is following exercise prescription and nutrition guidelines.;Long Term: Cholesterol controlled with medications as  prescribed, with individualized exercise RX and with personalized nutrition plan. Value goals: LDL < 70mg, 72m> 40 mg.    Stress  Yes    Intervention  Offer individual and/or small group education and counseling on adjustment to heart disease, stress management and health-related lifestyle change. Teach and support self-help strategies.;Refer participants experiencing significant psychosocial distress to appropriate mental health specialists for further evaluation and treatment. When possible,  include family members and significant others in education/counseling sessions.    Expected Outcomes  Short Term: Participant demonstrates changes in health-related behavior, relaxation and other stress management skills, ability to obtain effective social support, and compliance with psychotropic medications if prescribed.;Long Term: Emotional wellbeing is indicated by absence of clinically significant psychosocial distress or social isolation.           Core Components/Risk Factors/Patient Goals Review:  Goals and Risk Factor Review    Core Components/Risk Factors/Patient Goals Review    Row Name 12/18/17 0936   Personal Goals Review  Weight Management/Obesity;Hypertension;Lipids;Stress   Review  pt with CAD RF demonstrates eagerness to participate in CR program.  pt personal goals are to lose 10lb and resume previous activities.    Expected Outcomes  pt will participate in CR exercise, nutrition and lifestyle modificaiton for overall RF reducation.           Core Components/Risk Factors/Patient Goals at Discharge (Final Review):  Goals and Risk Factor Review - 12/18/17 0936    Core Components/Risk Factors/Patient Goals Review          Personal Goals Review  Weight Management/Obesity;Hypertension;Lipids;Stress    Review  pt with CAD RF demonstrates eagerness to participate in CR program.  pt personal goals are to lose 10lb and resume previous activities.     Expected Outcomes  pt will participate  in CR exercise, nutrition and lifestyle modificaiton for overall RF reducation.            ITP Comments: ITP Comments    Row Name 12/12/17 [phone] 12/18/17 0941   ITP Comments  Dr. Fransico Him Medical Director  pt started group exercise. pt tolerated light activity without difficulty. pt oriented to exercise equipment and safety routine.       Comments:

## 2017-12-20 ENCOUNTER — Encounter (HOSPITAL_COMMUNITY): Payer: Medicare Other

## 2017-12-20 ENCOUNTER — Other Ambulatory Visit: Payer: Self-pay

## 2017-12-20 ENCOUNTER — Other Ambulatory Visit (HOSPITAL_COMMUNITY): Payer: Medicare Other

## 2017-12-20 ENCOUNTER — Ambulatory Visit (HOSPITAL_COMMUNITY)
Admission: RE | Admit: 2017-12-20 | Discharge: 2017-12-20 | Disposition: A | Discharge status: Home / Self Care | Payer: Medicare Other | Source: Ambulatory Visit | Attending: Cardiology | Admitting: Cardiology

## 2017-12-20 ENCOUNTER — Encounter (HOSPITAL_COMMUNITY)
Admission: RE | Admit: 2017-12-20 | Discharge: 2017-12-20 | Disposition: A | Discharge status: Home / Self Care | Payer: Medicare Other | Source: Ambulatory Visit | Attending: Cardiology | Admitting: Cardiology

## 2017-12-20 DIAGNOSIS — Z951 Presence of aortocoronary bypass graft: Secondary | ICD-10-CM | POA: Diagnosis not present

## 2017-12-20 NOTE — Progress Notes (Addendum)
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 Marlou Porch   Pt arrived at cardiac rehab c/o chest discomfort last night. Pt rates 5/10 at onset. Pt states he awoke around 1am with chest discomfort under left breast. Pt states this was similar sensation to his pre CABG pain. Pt states he took tylenol which relieved the pain temporarily. Around 3am the pain returned. Pt took ASA with relief. Pt was very worried and concerned. Currently pain is reproducible with bending forward. Pt rates 3/10 with bending forward. Pain goes away with sitting upright. Pt denies cough, congestion, dyspnea. Lungs clear, heart sounds regular, no murmur. BP: 128/78, T-97.8 HR-74.  12 lead EKG obtained. Nechama Guard, PA notified. Vinn reviewed the 12 lead, pt history and current complaints. Per Vinn, pt advised to participate in light exercise at CR.  notifiy office if symptoms return. Pt instructed to present to ED for sudden severe unrelieved discomfort. Pt verbalized understanding. Andi Hence, RN, BSN Cardiac Pulmonary Rehab 12/20/17 8:28 AM

## 2017-12-23 ENCOUNTER — Encounter (HOSPITAL_COMMUNITY)
Admission: RE | Admit: 2017-12-23 | Discharge: 2017-12-23 | Disposition: A | Discharge status: Home / Self Care | Payer: Medicare Other | Source: Ambulatory Visit | Attending: Cardiology | Admitting: Cardiology

## 2017-12-23 ENCOUNTER — Encounter (HOSPITAL_COMMUNITY): Payer: Medicare Other

## 2017-12-23 DIAGNOSIS — Z951 Presence of aortocoronary bypass graft: Secondary | ICD-10-CM

## 2017-12-25 ENCOUNTER — Encounter (HOSPITAL_COMMUNITY)
Admission: RE | Admit: 2017-12-25 | Discharge: 2017-12-25 | Disposition: A | Discharge status: Home / Self Care | Payer: Medicare Other | Source: Ambulatory Visit | Attending: Cardiology | Admitting: Cardiology

## 2017-12-25 ENCOUNTER — Encounter (HOSPITAL_COMMUNITY): Payer: Medicare Other

## 2017-12-25 DIAGNOSIS — Z951 Presence of aortocoronary bypass graft: Secondary | ICD-10-CM

## 2017-12-25 NOTE — Progress Notes (Signed)
Jeff Decker 72 y.o. male DOB: 09-26-1945 MRN: [id number]      Nutrition Note  1. S/P CABG x 4    Past Medical History:  Diagnosis Date  . Allergy   . Arthritis   . Cancer ([place]) 03/2016   prostate and bone , chemo completed  . Coronary artery disease   . GERD (gastroesophageal reflux disease)   . Hyperlipidemia   . Myocardial infarction ([place]) 1989   Meds reviewed.    Current Outpatient Medications (Cardiovascular):  Marland Kitchen  Evolocumab (REPATHA SURECLICK) 270 MG/ML SOAJ, Inject 1 pen into the skin every 14 (fourteen) days. .  metoprolol tartrate (LOPRESSOR) 25 MG tablet, Take 0.5 tablets (12.5 mg total) by mouth 2 (two) times daily.  Current Outpatient Medications (Respiratory):  .  fexofenadine (ALLEGRA) 180 MG tablet, Take 180 mg by mouth daily.  Current Outpatient Medications (Analgesics):  .  acetaminophen (TYLENOL) 325 MG tablet, Take 2 tablets (650 mg total) by mouth every 6 (six) hours as needed for mild pain. Marland Kitchen  aspirin EC 81 MG tablet, Take 81 mg by mouth daily.   Current Outpatient Medications (Other):  Marland Kitchen  Calcium Carb-Cholecalciferol (CALCIUM 600+D) 600-800 MG-UNIT TABS, Take 1 tablet by mouth daily.  Marland Kitchen  glucosamine-chondroitin 500-400 MG tablet, Take 1 tablet by mouth daily.  .  Multiple Vitamin (MULTI-VITAMINS) TABS, Take by mouth. Marland Kitchen  omeprazole (PRILOSEC) 20 MG capsule, Take 20 mg by mouth daily before breakfast.   HT: Ht Readings from Last 1 Encounters:  12/12/17 5\' 10"  (1.778 m)    WT: Wt Readings from Last 5 Encounters:  12/12/17 184 lb 4.9 oz (83.6 kg)  11/15/17 184 lb 1.9 oz (83.5 kg)  11/11/17 185 lb (83.9 kg)  10/22/17 182 lb 1.9 oz (82.6 kg)  10/07/17 182 lb 1.6 oz (82.6 kg)     There is no height or weight on file to calculate BMI.   Current tobacco use? No  Labs:  Lipid Panel     Component Value Date/Time   CHOL 130 12/16/2017 0806   TRIG 172 (H) 12/16/2017 0806   HDL 39 (L) 12/16/2017 0806   CHOLHDL 3.3 12/16/2017 0806   LDLCALC  57 12/16/2017 0806    Lab Results  Component Value Date   HGBA1C 5.7 (H) 10/02/2017   CBG (last 3)  No results for input(s): GLUCAP in the last 72 hours.  Nutrition Note Spoke with pt. Nutrition plan and goals reviewed with pt. Pt is following heart healthy diet. Pt wants to lose wt, about 5-10 lbs. Pt has been trying to lose wt by eating smaller portions. Heart healthy wt loss tips reviewed (label reading, how to build a healthy plate, portion sizes, eating frequently across the day).Pt has Pre-diabetes. Last A1c indicates blood glucose elevated.  Reviewed the difference between complex and refined carbohydrates with patient. Recommended pt replace refined carbohydrates with complex carbohydrates. Additionally discussed reducing sugar sweetened beverages, currently having 1 soda daily (16 oz.), and 2 sweet teas daily, discussed switching to diet soda and slowly reducing sweet tea to un sweet by switching to half and half then un sweet tea with a splash of sweet. Per discussion, pt does not use canned/convenience foods often. Pt does not add salt to food. Pt does eat out frequently. Discussed how to navigate eating out, and smart swaps to help reduce saturated fat and sodium. Pt expressed understanding of the information reviewed. Pt aware of nutrition education classes offered and would like to attend nutrition classes.  Nutrition  Diagnosis  Food-and nutrition-related knowledge deficit related to lack of exposure to information as related to diagnosis of: ? CVD ? Pre-diabetes  Overweight  related to excessive energy intake as evidenced by a BMI 25.69  Nutrition Intervention ? Pt's individual nutrition plan and goals reviewed with pt. ? Pt given handouts for: ? Nutrition I class ? Nutrition II class  ? Diabetes Blitz Class ? Diabetes Q & A class  ? Consistent vit K diet ? low sodium ? DM ? pre-diabetes  Nutrition Goal(s):  ? Pt to identify and limit food sources of saturated fat, trans fat,  refined carbohydrates and sodium ? Pt to identify food quantities necessary to achieve weight loss of 6-24 lbs. at graduation from cardiac rehab.  ? Pt able to name foods that affect blood glucose.  Plan:  ? Pt to attend nutrition classes ? Nutrition I ? Nutrition II ? Portion Distortion  ? Will provide client-centered nutrition education as part of interdisciplinary care ? Monitor and evaluate progress toward nutrition goal with team.   Laurina Bustle, MS, RD, LDN 12/25/2017 9:13 AM

## 2017-12-27 ENCOUNTER — Encounter (HOSPITAL_COMMUNITY): Payer: Medicare Other

## 2017-12-30 ENCOUNTER — Encounter (HOSPITAL_COMMUNITY): Payer: Medicare Other

## 2017-12-30 ENCOUNTER — Encounter (HOSPITAL_COMMUNITY)
Admission: RE | Admit: 2017-12-30 | Discharge: 2017-12-30 | Disposition: A | Discharge status: Home / Self Care | Payer: Medicare Other | Source: Ambulatory Visit | Attending: Cardiology | Admitting: Cardiology

## 2017-12-30 DIAGNOSIS — Z951 Presence of aortocoronary bypass graft: Secondary | ICD-10-CM | POA: Insufficient documentation

## 2018-01-01 ENCOUNTER — Encounter (HOSPITAL_COMMUNITY)
Admission: RE | Admit: 2018-01-01 | Discharge: 2018-01-01 | Disposition: A | Discharge status: Home / Self Care | Payer: Medicare Other | Source: Ambulatory Visit | Attending: Cardiology | Admitting: Cardiology

## 2018-01-01 ENCOUNTER — Encounter (HOSPITAL_COMMUNITY): Payer: Medicare Other

## 2018-01-01 DIAGNOSIS — Z951 Presence of aortocoronary bypass graft: Secondary | ICD-10-CM

## 2018-01-03 ENCOUNTER — Encounter (HOSPITAL_COMMUNITY): Payer: Medicare Other

## 2018-01-03 ENCOUNTER — Encounter (HOSPITAL_COMMUNITY)
Admission: RE | Admit: 2018-01-03 | Discharge: 2018-01-03 | Disposition: A | Discharge status: Home / Self Care | Payer: Medicare Other | Source: Ambulatory Visit | Attending: Cardiology | Admitting: Cardiology

## 2018-01-03 DIAGNOSIS — Z951 Presence of aortocoronary bypass graft: Secondary | ICD-10-CM

## 2018-01-06 ENCOUNTER — Encounter (HOSPITAL_COMMUNITY): Payer: Medicare Other

## 2018-01-06 ENCOUNTER — Encounter (HOSPITAL_COMMUNITY)
Admission: RE | Admit: 2018-01-06 | Discharge: 2018-01-06 | Disposition: A | Discharge status: Home / Self Care | Payer: Medicare Other | Source: Ambulatory Visit | Attending: Cardiology | Admitting: Cardiology

## 2018-01-06 DIAGNOSIS — Z951 Presence of aortocoronary bypass graft: Secondary | ICD-10-CM

## 2018-01-06 NOTE — Progress Notes (Signed)
I have reviewed a Home Exercise Prescription with Jeff Decker . Jeff Decker is currently exercising at home.  The patient was advised to walk 5-7 days a week for 30-45 minutes.  Jeff Decker and I discussed how to progress their exercise prescription.  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  The patient stated that they understand the exercise prescription.  We reviewed exercise guidelines, target heart rate during exercise, RPE Scale, weather conditions, NTG use, endpoints for exercise, warmup and cool down.  Patient is encouraged to come to me with any questions. I will continue to follow up with the patient to assist them with progression and safety.    01/06/2018 10:30 AM  Deitra Mayo BS, ACSM CEP

## 2018-01-08 ENCOUNTER — Encounter (HOSPITAL_COMMUNITY): Payer: Medicare Other

## 2018-01-08 ENCOUNTER — Encounter (HOSPITAL_COMMUNITY)
Admission: RE | Admit: 2018-01-08 | Discharge: 2018-01-08 | Disposition: A | Discharge status: Home / Self Care | Payer: Medicare Other | Source: Ambulatory Visit | Attending: Cardiology | Admitting: Cardiology

## 2018-01-08 DIAGNOSIS — Z951 Presence of aortocoronary bypass graft: Secondary | ICD-10-CM

## 2018-01-08 NOTE — Progress Notes (Signed)
Cardiac Individual Treatment Plan  Patient Details  Name: Jeff Decker MRN: [id number] Date of Birth: 04-28-1945 Referring Provider:   Flowsheet Row CARDIAC REHAB PHASE II ORIENTATION from 12/12/2017 in [place]  Referring Provider  Dr. Marlou Porch      Initial Encounter Date:  Flowsheet Row CARDIAC REHAB PHASE II ORIENTATION from 12/12/2017 in [facility]  Date  12/13/18      Visit Diagnosis: S/P CABG x 4  Patient's Home Medications on Admission:  Current Outpatient Medications:  .  acetaminophen (TYLENOL) 325 MG tablet, Take 2 tablets (650 mg total) by mouth every 6 (six) hours as needed for mild pain., Disp: , Rfl:  .  aspirin EC 81 MG tablet, Take 81 mg by mouth daily., Disp: , Rfl:  .  Calcium Carb-Cholecalciferol (CALCIUM 600+D) 600-800 MG-UNIT TABS, Take 1 tablet by mouth daily. , Disp: , Rfl:  .  Evolocumab (REPATHA SURECLICK) 782 MG/ML SOAJ, Inject 1 pen into the skin every 14 (fourteen) days., Disp: 2 pen, Rfl: 11 .  fexofenadine (ALLEGRA) 180 MG tablet, Take 180 mg by mouth daily., Disp: , Rfl:  .  glucosamine-chondroitin 500-400 MG tablet, Take 1 tablet by mouth daily. , Disp: , Rfl:  .  metoprolol tartrate (LOPRESSOR) 25 MG tablet, Take 0.5 tablets (12.5 mg total) by mouth 2 (two) times daily., Disp: 90 tablet, Rfl: 3 .  Multiple Vitamin (MULTI-VITAMINS) TABS, Take by mouth., Disp: , Rfl:  .  omeprazole (PRILOSEC) 20 MG capsule, Take 20 mg by mouth daily before breakfast., Disp: , Rfl: 1  Past Medical History: Past Medical History:  Diagnosis Date  . Allergy   . Arthritis   . Cancer ([place]) 03/2016   prostate and bone , chemo completed  . Coronary artery disease   . GERD (gastroesophageal reflux disease)   . Hyperlipidemia   . Myocardial infarction ([place]) 1989    Tobacco Use: Social History   Tobacco Use  Smoking Status Former Smoker  . Last attempt to quit: 08/01/1986  . Years since  quitting: 31.4  Smokeless Tobacco Never Used    Labs: Recent Review Flowsheet Data    Labs for ITP Cardiac and Pulmonary Rehab Latest Ref Rng & Units 10/03/2017 10/03/2017 10/03/2017 10/04/2017 12/16/2017   Cholestrol 100 - 199 mg/dL - - - - 130   LDLCALC 0 - 99 mg/dL - - - - 57   HDL >39 mg/dL - - - - 39(L)   Trlycerides 0 - 149 mg/dL - - - - 172(H)   Hemoglobin A1c 4.8 - 5.6 % - - - - -   PHART 7.350 - 7.450 7.447 7.319(L) - - -   PCO2ART 32.0 - 48.0 mmHg 35.6 49.5(H) - - -   HCO3 20.0 - 28.0 mmol/L 24.4 25.2 - - -   TCO2 22 - 32 mmol/L 25 27 25 24  -   ACIDBASEDEF 0.0 - 2.0 mmol/L - 1.0 - - -   O2SAT % 100.0 97.0 - - -      Capillary Blood Glucose: Lab Results  Component Value Date   GLUCAP 122 (H) 10/05/2017   GLUCAP 98 10/05/2017   GLUCAP 114 (H) 10/04/2017   GLUCAP 136 (H) 10/04/2017   GLUCAP 119 (H) 10/04/2017     Exercise Target Goals: Exercise Program Goal: Individual exercise prescription set using results from initial 6 min walk test and THRR while considering  patient's activity barriers and safety.   Exercise Prescription Goal: Initial exercise  prescription builds to 30-45 minutes a day of aerobic activity, 2-3 days per week.  Home exercise guidelines will be given to patient during program as part of exercise prescription that the participant will acknowledge.  Activity Barriers & Risk Stratification: Activity Barriers & Cardiac Risk Stratification - 12/12/17 0944    Activity Barriers & Cardiac Risk Stratification          Activity Barriers  Deconditioning;Muscular Weakness    Cardiac Risk Stratification  High           6 Minute Walk: 6 Minute Walk    6 Minute Walk    Row Name 12/12/17 0943   Phase  Initial   Distance  1491 feet   Walk Time  6 minutes   # of Rest Breaks  0   MPH  2.82   METS  2.91   RPE  6   VO2 Peak  10.17   Symptoms  No   Resting HR  55 bpm   Resting BP  104/72   Resting Oxygen Saturation   99 %   Exercise Oxygen Saturation   during 6 min walk  100 %   Max Ex. HR  78 bpm   Max Ex. BP  120/74   2 Minute Post BP  102/70          Oxygen Initial Assessment:   Oxygen Re-Evaluation:   Oxygen Discharge (Final Oxygen Re-Evaluation):   Initial Exercise Prescription: Initial Exercise Prescription - 12/12/17 1000    Date of Initial Exercise RX and Referring Provider          Date  12/13/18    Referring Provider  Dr. Marlou Porch    Expected Discharge Date  03/20/18        Treadmill          MPH  3    Grade  1    Minutes  10        Bike          Level  1    Minutes  10    METs  3.2        NuStep          Level  4    SPM  85    Minutes  10    METs  3        Prescription Details          Frequency (times per week)  3    Duration  Progress to 30 minutes of continuous aerobic without signs/symptoms of physical distress        Intensity          THRR 40-80% of Max Heartrate  59-118    Ratings of Perceived Exertion  11-13        Progression          Progression  Continue to progress workloads to maintain intensity without signs/symptoms of physical distress.        Resistance Training          Training Prescription  Yes    Weight  4 lbs.     Reps  10-15           Perform Capillary Blood Glucose checks as needed.  Exercise Prescription Changes: Exercise Prescription Changes    Response to Exercise    Row Name 12/18/17 0934 01/06/18 1000   Blood Pressure (Admit)  118/72  104/62   Blood Pressure (Exercise)  126/70  130/70   Blood Pressure (Exit)  108/60  104/62   Heart Rate (Admit)  84 bpm  67 bpm   Heart Rate (Exercise)  96 bpm  119 bpm   Heart Rate (Exit)  75 bpm  77 bpm   Rating of Perceived Exertion (Exercise)  8  11   Comments  Pt first day of exercise.   Reviewed HEP   Duration  Continue with 30 min of aerobic exercise without signs/symptoms of physical distress.  Continue with 30 min of aerobic exercise without signs/symptoms of physical distress.   Intensity  THRR  unchanged  THRR unchanged       Progression    Row Name 12/18/17 0934 01/06/18 1000   Progression  Continue to progress workloads to maintain intensity without signs/symptoms of physical distress.  Continue to progress workloads to maintain intensity without signs/symptoms of physical distress.   Average METs  3.4  5       Resistance Training    Row Name 12/18/17 0934 01/06/18 1000   Training Prescription  No  Yes   Weight  no documentation  4 lbs.    Reps  no documentation  10-15   Time  no documentation  10 Minutes       Treadmill    Row Name 12/18/17 0934 01/06/18 1000   MPH  3  3.8   Grade  1  2   Minutes  10  [place] Name 12/18/17 0934 01/06/18 1000   Level  1  2   Minutes  10  10   METs  3.27  5.13       NuStep    Row Name 12/18/17 0934 01/06/18 1000   Level  4  5   SPM  85  85   Minutes  10  10   METs  3.2  4.9       Home Exercise Plan    Row Name 12/18/17 0934 01/06/18 1000   Plans to continue exercise at  no documentation  Home (comment)   Frequency  no documentation  Add 3 additional days to program exercise sessions.   Initial Home Exercises Provided  no documentation  01/06/18          Exercise Comments: Exercise Comments    Row Name 12/18/17 1607 01/06/18 1035 01/07/18 1408   Exercise Comments  Pt first day of exercise. Tolerated exercise well and will continue exercising at home in addition to cardiac rehab.   Reviewed HEP with Pt. Pt was responsive.   Reviewed METs and goals with Pt. Pt will continue exercising at home in addition to Cardiac Rehab.       Exercise Goals and Review: Exercise Goals    Exercise Goals    Row Name 12/12/17 0945   Increase Physical Activity  Yes   Intervention  Provide advice, education, support and counseling about physical activity/exercise needs.;Develop an individualized exercise prescription for aerobic and resistive training based on initial evaluation findings, risk stratification, comorbidities  and participant's personal goals.   Expected Outcomes  Short Term: Attend rehab on a regular basis to increase amount of physical activity.   Increase Strength and Stamina  Yes   Intervention  Provide advice, education, support and counseling about physical activity/exercise needs.;Develop an individualized exercise prescription for aerobic and resistive training based on initial evaluation findings, risk stratification, comorbidities and participant's personal goals.   Expected Outcomes  Short Term: Increase workloads from initial exercise prescription for resistance, speed,  and METs.   Able to understand and use rate of perceived exertion (RPE) scale  Yes   Intervention  Provide education and explanation on how to use RPE scale   Expected Outcomes  Short Term: Able to use RPE daily in rehab to express subjective intensity level;Long Term:  Able to use RPE to guide intensity level when exercising independently   Knowledge and understanding of Target Heart Rate Range (THRR)  Yes   Intervention  Provide education and explanation of THRR including how the numbers were predicted and where they are located for reference   Expected Outcomes  Short Term: Able to state/look up THRR;Long Term: Able to use THRR to govern intensity when exercising independently;Short Term: Able to use daily as guideline for intensity in rehab   Able to check pulse independently  Yes   Intervention  Provide education and demonstration on how to check pulse in carotid and radial arteries.;Review the importance of being able to check your own pulse for safety during independent exercise   Expected Outcomes  Short Term: Able to explain why pulse checking is important during independent exercise;Long Term: Able to check pulse independently and accurately   Understanding of Exercise Prescription  Yes   Intervention  Provide education, explanation, and written materials on patient's individual exercise prescription   Expected  Outcomes  Short Term: Able to explain program exercise prescription;Long Term: Able to explain home exercise prescription to exercise independently          Exercise Goals Re-Evaluation : Exercise Goals Re-Evaluation    Exercise Goal Re-Evaluation    Row Name 12/18/17 0936 01/06/18 1032 01/07/18 1406   Exercise Goals Review  Increase Physical Activity;Increase Strength and Stamina;Able to check pulse independently;Understanding of Exercise Prescription;Able to understand and use rate of perceived exertion (RPE) scale;Knowledge and understanding of Target Heart Rate Range (THRR)  Increase Physical Activity;Increase Strength and Stamina;Able to understand and use rate of perceived exertion (RPE) scale;Knowledge and understanding of Target Heart Rate Range (THRR);Understanding of Exercise Prescription;Able to check pulse independently  Increase Physical Activity;Increase Strength and Stamina;Able to understand and use rate of perceived exertion (RPE) scale;Knowledge and understanding of Target Heart Rate Range (THRR);Understanding of Exercise Prescription;Able to check pulse independently   Comments  Pt first day of exercise. Pt tolerated exercise well and had an average MET level of 3.4. 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   Reviewed HEP with Pt. Pt was responsive and understand THRR, MET level, exercise precautions, weather precautions, end points of exercise, warm up and cool down. Pt plans to continue exercising at home in addition to Cardiac Rehab.   Reviewed METs and goals with Pt. Pt is progressing well and is increasing workloads gradually. 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    Expected Outcomes  Will continue to monitor and progress Pt as tolerated.   Will continue to monitor and progress Pt as tolerated.   Will continue to monitor and progress Pt as tolerated.           Discharge Exercise  Prescription (Final Exercise Prescription Changes): Exercise Prescription Changes - 01/06/18 1000    Response to Exercise          Blood Pressure (Admit)  104/62    Blood Pressure (Exercise)  130/70    Blood Pressure (Exit)  104/62    Heart Rate (Admit)  67 bpm  Heart Rate (Exercise)  119 bpm    Heart Rate (Exit)  77 bpm    Rating of Perceived Exertion (Exercise)  11    Comments  Reviewed HEP    Duration  Continue with 30 min of aerobic exercise without signs/symptoms of physical distress.    Intensity  THRR unchanged        Progression          Progression  Continue to progress workloads to maintain intensity without signs/symptoms of physical distress.    Average METs  5        Resistance Training          Training Prescription  Yes    Weight  4 lbs.     Reps  10-15    Time  10 Minutes        Treadmill          MPH  3.8    Grade  2    Minutes  10        Bike          Level  2    Minutes  10    METs  5.13        NuStep          Level  5    SPM  85    Minutes  10    METs  4.9        Home Exercise Plan          Plans to continue exercise at  Home (comment)    Frequency  Add 3 additional days to program exercise sessions.    Initial Home Exercises Provided  01/06/18           Nutrition:  Target Goals: Understanding of nutrition guidelines, daily intake of sodium <1576m, cholesterol <2022m calories 30% from fat and 7% or less from saturated fats, daily to have 5 or more servings of fruits and vegetables.  Biometrics: Pre Biometrics - 12/12/17 0945    Pre Biometrics          Height  5' 10"  (1.778 m)    Weight  83.6 kg    Waist Circumference  39 inches    Hip Circumference  40 inches    Waist to Hip Ratio  0.98 %    BMI (Calculated)  26.44    Triceps Skinfold  22 mm    % Body Fat  28.2 %    Grip Strength  25 kg    Flexibility  10 in    Single Leg Stand  30 seconds            Nutrition Therapy Plan and Nutrition Goals: Nutrition  Therapy & Goals - 12/12/17 1025    Nutrition Therapy          Diet  heart healthy        Personal Nutrition Goals          Nutrition Goal  Pt to identify and limit food sources of saturated fat, trans fat, refined carbohydrates and sodium    Personal Goal #2  Pt able to name foods that affect blood glucose        Intervention Plan          Intervention  Prescribe, educate and counsel regarding individualized specific dietary modifications aiming towards targeted core components such as weight, hypertension, lipid management, diabetes, heart failure and other comorbidities.    Expected Outcomes  Short Term Goal: Understand basic principles of dietary  content, such as calories, fat, sodium, cholesterol and nutrients.;Long Term Goal: Adherence to prescribed nutrition plan.           Nutrition Assessments: Nutrition Assessments - 12/12/17 1027    MEDFICTS Scores          Pre Score  41           Nutrition Goals Re-Evaluation:   Nutrition Goals Re-Evaluation:   Nutrition Goals Discharge (Final Nutrition Goals Re-Evaluation):   Psychosocial: Target Goals: Acknowledge presence or absence of significant depression and/or stress, maximize coping skills, provide positive support system. Participant is able to verbalize types and ability to use techniques and skills needed for reducing stress and depression.  Initial Review & Psychosocial Screening: Initial Psych Review & Screening - 12/12/17 1204    Initial Review          Current issues with  None Identified        Family Dynamics          Good Support System?  Yes        Barriers          Psychosocial barriers to participate in program  There are no identifiable barriers or psychosocial needs.        Screening Interventions          Interventions  Encouraged to exercise           Quality of Life Scores: Quality of Life - 12/12/17 0918    Quality of Life          Select  Quality of Life        Quality  of Life Scores          Health/Function Pre  27.2 %    Socioeconomic Pre  28.93 %    Psych/Spiritual Pre  29.14 %    Family Pre  28.8 %    GLOBAL Pre  28.19 %          Scores of 19 and below usually indicate a poorer quality of life in these areas.  A difference of  2-3 points is a clinically meaningful difference.  A difference of 2-3 points in the total score of the Quality of Life Index has been associated with significant improvement in overall quality of life, self-image, physical symptoms, and general health in studies assessing change in quality of life.  PHQ-9: Recent Review Flowsheet Data    Depression screen [facility] 2/9 12/18/2017   Decreased Interest 0   Down, Depressed, Hopeless 0   PHQ - 2 Score 0     Interpretation of Total Score  Total Score Depression Severity:  1-4 = Minimal depression, 5-9 = Mild depression, 10-14 = Moderate depression, 15-19 = Moderately severe depression, 20-27 = Severe depression   Psychosocial Evaluation and Intervention: Psychosocial Evaluation - 12/18/17 0934    Psychosocial Evaluation & Interventions          Interventions  Encouraged to exercise with the program and follow exercise prescription    Comments  no psychosocial needs identified, no interventions necessary.  pt enjoys horse riding, farming and dancing at [facility]      Expected Outcomes  pt will exhibit positive outlook with good coping skills    Continue Psychosocial Services   No Follow up required           Psychosocial Re-Evaluation: Psychosocial Re-Evaluation    Psychosocial Re-Evaluation    Row Name 01/03/18 [phone]   Current issues with  None Identified   Comments  no psychosocial needs identified, no interventions necessary    Expected Outcomes  pt will exhibit positive outlook with good coping skills   Interventions  Encouraged to attend Cardiac Rehabilitation for the exercise   Continue Psychosocial Services   No Follow up required          Psychosocial  Discharge (Final Psychosocial Re-Evaluation): Psychosocial Re-Evaluation - 01/03/18 0734    Psychosocial Re-Evaluation          Current issues with  None Identified    Comments  no psychosocial needs identified, no interventions necessary     Expected Outcomes  pt will exhibit positive outlook with good coping skills    Interventions  Encouraged to attend Cardiac Rehabilitation for the exercise    Continue Psychosocial Services   No Follow up required           Vocational Rehabilitation: Provide vocational rehab assistance to qualifying candidates.   Vocational Rehab Evaluation & Intervention: Vocational Rehab - 12/12/17 0919    Initial Vocational Rehab Evaluation & Intervention          Assessment shows need for Vocational Rehabilitation  No           Education: Education Goals: Education classes will be provided on a weekly basis, covering required topics. Participant will state understanding/return demonstration of topics presented.  Learning Barriers/Preferences: Learning Barriers/Preferences - 12/12/17 0946    Learning Barriers/Preferences          Learning Barriers  Sight;Hearing    Learning Preferences  Pictoral;Written Material           Education Topics: Count Your Pulse:  -Group instruction provided by verbal instruction, demonstration, patient participation and written materials to support subject.  Instructors address importance of being able to find your pulse and how to count your pulse when at home without a heart monitor.  Patients get hands on experience counting their pulse with staff help and individually.   Heart Attack, Angina, and Risk Factor Modification:  -Group instruction provided by verbal instruction, video, and written materials to support subject.  Instructors address signs and symptoms of angina and heart attacks.    Also discuss risk factors for heart disease and how to make changes to improve heart health risk factors. Flowsheet Row  CARDIAC REHAB PHASE II EXERCISE from 01/08/2018 in [place]  Date  01/08/18  Educator  RN  Instruction Review Code  2- Demonstrated Understanding      Functional Fitness:  -Group instruction provided by verbal instruction, demonstration, patient participation, and written materials to support subject.  Instructors address safety measures for doing things around the house.  Discuss how to get up and down off the floor, how to pick things up properly, how to safely get out of a chair without assistance, and balance training.   Meditation and Mindfulness:  -Group instruction provided by verbal instruction, patient participation, and written materials to support subject.  Instructor addresses importance of mindfulness and meditation practice to help reduce stress and improve awareness.  Instructor also leads participants through a meditation exercise.    Stretching for Flexibility and Mobility:  -Group instruction provided by verbal instruction, patient participation, and written materials to support subject.  Instructors lead participants through series of stretches that are designed to increase flexibility thus improving mobility.  These stretches are additional exercise for major muscle groups that are typically performed during regular warm up and cool down.   Hands Only CPR:  -Group verbal, video, and participation  provides a basic overview of AHA guidelines for community CPR. Role-play of emergencies allow participants the opportunity to practice calling for help and chest compression technique with discussion of AED use.   Hypertension: -Group verbal and written instruction that provides a basic overview of hypertension including the most recent diagnostic guidelines, risk factor reduction with self-care instructions and medication management. Flowsheet Row CARDIAC REHAB PHASE II EXERCISE from 01/08/2018 in [place]   Date  12/18/17  Educator  RN  Instruction Review Code  2- Demonstrated Understanding       Nutrition I class: Heart Healthy Eating:  -Group instruction provided by PowerPoint slides, verbal discussion, and written materials to support subject matter. The instructor gives an explanation and review of the Therapeutic Lifestyle Changes diet recommendations, which includes a discussion on lipid goals, dietary fat, sodium, fiber, plant stanol/sterol esters, sugar, and the components of a well-balanced, healthy diet.   Nutrition II class: Lifestyle Skills:  -Group instruction provided by PowerPoint slides, verbal discussion, and written materials to support subject matter. The instructor gives an explanation and review of label reading, grocery shopping for heart health, heart healthy recipe modifications, and ways to make healthier choices when eating out.   Diabetes Question & Answer:  -Group instruction provided by PowerPoint slides, verbal discussion, and written materials to support subject matter. The instructor gives an explanation and review of diabetes co-morbidities, pre- and post-prandial blood glucose goals, pre-exercise blood glucose goals, signs, symptoms, and treatment of hypoglycemia and hyperglycemia, and foot care basics.   Diabetes Blitz:  -Group instruction provided by PowerPoint slides, verbal discussion, and written materials to support subject matter. The instructor gives an explanation and review of the physiology behind type 1 and type 2 diabetes, diabetes medications and rational behind using different medications, pre- and post-prandial blood glucose recommendations and Hemoglobin A1c goals, diabetes diet, and exercise including blood glucose guidelines for exercising safely.    Portion Distortion:  -Group instruction provided by PowerPoint slides, verbal discussion, written materials, and food models to support subject matter. The instructor gives an explanation of  serving size versus portion size, changes in portions sizes over the last 20 years, and what consists of a serving from each food group.   Stress Management:  -Group instruction provided by verbal instruction, video, and written materials to support subject matter.  Instructors review role of stress in heart disease and how to cope with stress positively.     Exercising on Your Own:  -Group instruction provided by verbal instruction, power point, and written materials to support subject.  Instructors discuss benefits of exercise, components of exercise, frequency and intensity of exercise, and end points for exercise.  Also discuss use of nitroglycerin and activating EMS.  Review options of places to exercise outside of rehab.  Review guidelines for sex with heart disease.   Cardiac Drugs I:  -Group instruction provided by verbal instruction and written materials to support subject.  Instructor reviews cardiac drug classes: antiplatelets, anticoagulants, beta blockers, and statins.  Instructor discusses reasons, side effects, and lifestyle considerations for each drug class.   Cardiac Drugs II:  -Group instruction provided by verbal instruction and written materials to support subject.  Instructor reviews cardiac drug classes: angiotensin converting enzyme inhibitors (ACE-I), angiotensin II receptor blockers (ARBs), nitrates, and calcium channel blockers.  Instructor discusses reasons, side effects, and lifestyle considerations for each drug class. Flowsheet Row CARDIAC REHAB PHASE II EXERCISE from 01/08/2018 in [place]  Date  01/01/18  Educator  Pharmacist   Instruction Review Code  2- Demonstrated Understanding      Anatomy and Physiology of the Circulatory System:  Group verbal and written instruction and models provide basic cardiac anatomy and physiology, with the coronary electrical and arterial systems. Review of: AMI, Angina, Valve disease, Heart  Failure, Peripheral Artery Disease, Cardiac Arrhythmia, Pacemakers, and the ICD. Flowsheet Row CARDIAC REHAB PHASE II EXERCISE from 01/08/2018 in [place]  Date  12/25/17  Educator  RN  Instruction Review Code  2- Demonstrated Understanding      Other Education:  -Group or individual verbal, written, or video instructions that support the educational goals of the cardiac rehab program.   Holiday Eating Survival Tips:  -Group instruction provided by PowerPoint slides, verbal discussion, and written materials to support subject matter. The instructor gives patients tips, tricks, and techniques to help them not only survive but enjoy the holidays despite the onslaught of food that accompanies the holidays.   Knowledge Questionnaire Score: Knowledge Questionnaire Score - 12/12/17 0919    Knowledge Questionnaire Score          Pre Score  22/24           Core Components/Risk Factors/Patient Goals at Admission: Personal Goals and Risk Factors at Admission - 12/12/17 0947    Core Components/Risk Factors/Patient Goals on Admission           Weight Management  Yes;Weight Loss;Weight Maintenance    Intervention  Weight Management: Develop a combined nutrition and exercise program designed to reach desired caloric intake, while maintaining appropriate intake of nutrient and fiber, sodium and fats, and appropriate energy expenditure required for the weight goal.;Weight Management: Provide education and appropriate resources to help participant work on and attain dietary goals.;Weight Management/Obesity: Establish reasonable short term and long term weight goals.    Admit Weight  184 lb 4.9 oz (83.6 kg)    Expected Outcomes  Short Term: Continue to assess and modify interventions until short term weight is achieved;Long Term: Adherence to nutrition and physical activity/exercise program aimed toward attainment of established weight goal;Weight Maintenance:  Understanding of the daily nutrition guidelines, which includes 25-35% calories from fat, 7% or less cal from saturated fats, less than 259m cholesterol, less than 1.5gm of sodium, & 5 or more servings of fruits and vegetables daily;Weight Loss: Understanding of general recommendations for a balanced deficit meal plan, which promotes 1-2 lb weight loss per week and includes a negative energy balance of [phone] kcal/d;Understanding recommendations for meals to include 15-35% energy as protein, 25-35% energy from fat, 35-60% energy from carbohydrates, less than 2017mof dietary cholesterol, 20-35 gm of total fiber daily;Understanding of distribution of calorie intake throughout the day with the consumption of 4-5 meals/snacks    Hypertension  Yes    Intervention  Provide education on lifestyle modifcations including regular physical activity/exercise, weight management, moderate sodium restriction and increased consumption of fresh fruit, vegetables, and low fat dairy, alcohol moderation, and smoking cessation.;Monitor prescription use compliance.    Expected Outcomes  Short Term: Continued assessment and intervention until BP is < 140/9032mG in hypertensive participants. < 130/70m28m in hypertensive participants with diabetes, heart failure or chronic kidney disease.;Long Term: Maintenance of blood pressure at goal levels.    Lipids  Yes    Intervention  Provide education and support for participant on nutrition & aerobic/resistive exercise along with prescribed medications to achieve LDL <70mg61mL >40mg.38m  Expected Outcomes  Short Term: Participant states understanding of desired cholesterol values and is compliant with medications prescribed. Participant is following exercise prescription and nutrition guidelines.;Long Term: Cholesterol controlled with medications as prescribed, with individualized exercise RX and with personalized nutrition plan. Value goals: LDL < 58m, HDL > 40 mg.    Stress  Yes     Intervention  Offer individual and/or small group education and counseling on adjustment to heart disease, stress management and health-related lifestyle change. Teach and support self-help strategies.;Refer participants experiencing significant psychosocial distress to appropriate mental health specialists for further evaluation and treatment. When possible, include family members and significant others in education/counseling sessions.    Expected Outcomes  Short Term: Participant demonstrates changes in health-related behavior, relaxation and other stress management skills, ability to obtain effective social support, and compliance with psychotropic medications if prescribed.;Long Term: Emotional wellbeing is indicated by absence of clinically significant psychosocial distress or social isolation.           Core Components/Risk Factors/Patient Goals Review:  Goals and Risk Factor Review    Core Components/Risk Factors/Patient Goals Review    Row Name 12/18/17 0936 01/03/18 0734   Personal Goals Review  Weight Management/Obesity;Hypertension;Lipids;Stress  Weight Management/Obesity;Hypertension;Lipids;Stress   Review  pt with CAD RF demonstrates eagerness to participate in CR program.  pt personal goals are to lose 10lb and resume previous activities.   pt with CAD RF demonstrates eagerness to participate in CR program.  pt personal goals are to lose 10lb and resume previous activities. pt is eager to be able to walk further with less DOE.  pt desires to walk uphill and downhill without difficulty.    Expected Outcomes  pt will participate in CR exercise, nutrition and lifestyle modificaiton for overall RF reducation.   pt will participate in CR exercise, nutrition and lifestyle modificaiton for overall RF reducation.           Core Components/Risk Factors/Patient Goals at Discharge (Final Review):  Goals and Risk Factor Review - 01/03/18 0734    Core Components/Risk Factors/Patient Goals  Review          Personal Goals Review  Weight Management/Obesity;Hypertension;Lipids;Stress    Review  pt with CAD RF demonstrates eagerness to participate in CR program.  pt personal goals are to lose 10lb and resume previous activities. pt is eager to be able to walk further with less DOE.  pt desires to walk uphill and downhill without difficulty.     Expected Outcomes  pt will participate in CR exercise, nutrition and lifestyle modificaiton for overall RF reducation.            ITP Comments: ITP Comments    Row Name 12/12/17 [id number]/20/19 0941 01/03/18 0731   ITP Comments  Dr. TFransico HimMedical Director  pt started group exercise. pt tolerated light activity without difficulty. pt oriented to exercise equipment and safety routine.   30 day ITP review.  pt with good attendance and participation.       Comments:

## 2018-01-10 ENCOUNTER — Encounter (HOSPITAL_COMMUNITY): Payer: Medicare Other

## 2018-01-10 ENCOUNTER — Encounter (HOSPITAL_COMMUNITY)
Admission: RE | Admit: 2018-01-10 | Discharge: 2018-01-10 | Disposition: A | Discharge status: Home / Self Care | Payer: Medicare Other | Source: Ambulatory Visit | Attending: Cardiology | Admitting: Cardiology

## 2018-01-10 DIAGNOSIS — Z951 Presence of aortocoronary bypass graft: Secondary | ICD-10-CM

## 2018-01-13 ENCOUNTER — Encounter (HOSPITAL_COMMUNITY): Payer: Medicare Other

## 2018-01-13 ENCOUNTER — Encounter (HOSPITAL_COMMUNITY)
Admission: RE | Admit: 2018-01-13 | Discharge: 2018-01-13 | Disposition: A | Discharge status: Home / Self Care | Payer: Medicare Other | Source: Ambulatory Visit | Attending: Cardiology | Admitting: Cardiology

## 2018-01-13 DIAGNOSIS — Z951 Presence of aortocoronary bypass graft: Secondary | ICD-10-CM

## 2018-01-14 ENCOUNTER — Inpatient Hospital Stay: Payer: Medicare Other | Attending: Oncology

## 2018-01-14 DIAGNOSIS — C61 Malignant neoplasm of prostate: Secondary | ICD-10-CM | POA: Diagnosis not present

## 2018-01-14 DIAGNOSIS — Z79818 Long term (current) use of other agents affecting estrogen receptors and estrogen levels: Secondary | ICD-10-CM | POA: Diagnosis not present

## 2018-01-14 LAB — CBC WITH DIFFERENTIAL (CANCER CENTER ONLY)
Abs Immature Granulocytes: 0.01 10*3/uL (ref 0.00–0.07)
Basophils Absolute: 0 10*3/uL (ref 0.0–0.1)
Basophils Relative: 1 %
Eosinophils Absolute: 0.2 10*3/uL (ref 0.0–0.5)
Eosinophils Relative: 4 %
HCT: 38.3 % — ABNORMAL LOW (ref 39.0–52.0)
Hemoglobin: 12.3 g/dL — ABNORMAL LOW (ref 13.0–17.0)
Immature Granulocytes: 0 %
Lymphocytes Relative: 30 %
Lymphs Abs: 1.7 10*3/uL (ref 0.7–4.0)
MCH: 27.6 pg (ref 26.0–34.0)
MCHC: 32.1 g/dL (ref 30.0–36.0)
MCV: 85.9 fL (ref 80.0–100.0)
Monocytes Absolute: 0.6 10*3/uL (ref 0.1–1.0)
Monocytes Relative: 10 %
Neutro Abs: 3.2 10*3/uL (ref 1.7–7.7)
Neutrophils Relative %: 55 %
Platelet Count: 227 10*3/uL (ref 150–400)
RBC: 4.46 MIL/uL (ref 4.22–5.81)
RDW: 13.1 % (ref 11.5–15.5)
WBC Count: 5.8 10*3/uL (ref 4.0–10.5)
nRBC: 0 % (ref 0.0–0.2)

## 2018-01-14 LAB — CMP (CANCER CENTER ONLY)
ALT: 22 U/L (ref 0–44)
AST: 18 U/L (ref 15–41)
Albumin: 4.2 g/dL (ref 3.5–5.0)
Alkaline Phosphatase: 85 U/L (ref 38–126)
Anion gap: 9 (ref 5–15)
BUN: 13 mg/dL (ref 8–23)
CO2: 27 mmol/L (ref 22–32)
Calcium: 9.6 mg/dL (ref 8.9–10.3)
Chloride: 106 mmol/L (ref 98–111)
Creatinine: 0.86 mg/dL (ref 0.61–1.24)
GFR, Est AFR Am: >60 mL/min (ref >60)
GFR, Estimated: >60 mL/min (ref >60)
Glucose, Bld: 112 mg/dL — ABNORMAL HIGH (ref 70–99)
Potassium: 4.7 mmol/L (ref 3.5–5.1)
Sodium: 142 mmol/L (ref 135–145)
Total Bilirubin: 0.4 mg/dL (ref 0.3–1.2)
Total Protein: 7.5 g/dL (ref 6.5–8.1)

## 2018-01-15 ENCOUNTER — Telehealth (HOSPITAL_COMMUNITY): Payer: Self-pay | Admitting: Internal Medicine

## 2018-01-15 ENCOUNTER — Inpatient Hospital Stay (HOSPITAL_BASED_OUTPATIENT_CLINIC_OR_DEPARTMENT_OTHER): Payer: Medicare Other | Admitting: Oncology

## 2018-01-15 ENCOUNTER — Encounter (HOSPITAL_COMMUNITY): Payer: Medicare Other

## 2018-01-15 ENCOUNTER — Telehealth: Payer: Self-pay

## 2018-01-15 VITALS — BP 102/60 | HR 60 | Temp 97.9°F | Resp 17 | Ht 70.0 in | Wt 184.1 lb

## 2018-01-15 DIAGNOSIS — C61 Malignant neoplasm of prostate: Secondary | ICD-10-CM | POA: Diagnosis not present

## 2018-01-15 DIAGNOSIS — Z79818 Long term (current) use of other agents affecting estrogen receptors and estrogen levels: Secondary | ICD-10-CM

## 2018-01-15 LAB — PROSTATE-SPECIFIC AG, SERUM (LABCORP): Prostate Specific Ag, Serum: <0.1 ng/mL (ref 0.0–4.0)

## 2018-01-15 MED ORDER — LEUPROLIDE ACETATE (4 MONTH) 30 MG IM KIT
30.0000 mg | PACK | Freq: Once | INTRAMUSCULAR | Status: AC
  Administered 2018-01-15: 30 mg via INTRAMUSCULAR
  Filled 2018-01-15: qty 30

## 2018-01-15 NOTE — Progress Notes (Signed)
Hematology and Oncology Follow Up Visit  Jeff Decker [id number] February 17, 1945 72 y.o. 01/15/2018 7:56 AM [name], [place]   Principle Diagnosis: 72 year old man with advanced prostate cancer and abdominal adenopathy diagnosed in April 2018.  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    Prior Therapy:  Taxotere chemotherapy at 75 mg/m cycle one given on 06/13/2016. He is S/P 6 cycles concluded on 09/25/2016.   Current therapy:  Lupron 45 mg every 6 months started in [facility].  He will receive 30 mg every 4 months starting on 01/15/2018 at the cancer center.    Interim History: [name] returns today for a repeat evaluation.  Since the last visit, since the last visit, he reports no recent complaints.  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  He did not suffer a myocardial infarction but was experiencing unstable angina.  He recovered reasonably well from his operation and currently participating in cardiac rehab.  He denies any dyspnea on exertion or bone pain.  He denies any excessive weakness or fatigue.  He is able to attend activities of daily living without any decline.  He does not report any headaches, blurry vision, syncope or seizures.  He denies any confusion or lethargy.  He does not report any fevers, chills or sweats. He does not report any cough, wheezing or hemoptysis.  Denies any chest pain palpitation orthopnea.  He does not report any nausea, vomiting or abdominal distention.  He denies any changes in bowel habits. He does not report any frequency, urgency or hesitancy. He does not report any bone pain or pathological fracture.  He does not report any skin rashes or lesions.  He does not report any ecchymosis or easy bleeding.  Denies any mood changes.  Remaining review of system is negative.  Medications:  I have reviewed the patient's current medications.  Current Outpatient Medications  Medication Sig Dispense Refill  . acetaminophen (TYLENOL) 325 MG tablet Take 2 tablets (650 mg total) by mouth every 6 (six) hours as needed for mild pain.    Marland Kitchen aspirin EC 81 MG tablet Take 81 mg by mouth daily.    . Calcium Carb-Cholecalciferol (CALCIUM 600+D) 600-800 MG-UNIT TABS Take 1 tablet by mouth daily.     . Evolocumab (REPATHA SURECLICK) 250 MG/ML SOAJ Inject 1 pen into the skin every 14 (fourteen) days. 2 pen 11  . fexofenadine (ALLEGRA) 180 MG tablet Take 180 mg by mouth daily.    Marland Kitchen glucosamine-chondroitin 500-400 MG tablet Take 1 tablet by mouth daily.     . metoprolol tartrate (LOPRESSOR) 25 MG tablet Take 0.5 tablets (12.5 mg total) by mouth 2 (two) times daily. 90 tablet 3  . Multiple Vitamin (MULTI-VITAMINS) TABS Take by mouth.    Marland Kitchen omeprazole (PRILOSEC) 20 MG capsule Take 20 mg by mouth daily before breakfast.  1   No current facility-administered medications for this visit.      Allergies:  Allergies  Allergen Reactions  . Statins Other (See Comments)    Muscle pain Tried 3 different statins    Past Medical History, Surgical history, Social history, and Family History reviewed and remain unchanged.   Physical Exam: Blood pressure 102/60, pulse 60, temperature 97.9 F (36.6 C), temperature source Oral, resp. rate 17, height 5\' 10"  (1.778 m), weight 184 lb 1.6 oz (83.5 kg), SpO2 99 %.  ECOG: 0   General appearance: Comfortable appearing without any discomfort Head: Normocephalic without any trauma Oropharynx: Mucous membranes are moist and pink without any thrush or ulcers. Eyes: Pupils are equal and round reactive to light. Lymph nodes: No cervical, supraclavicular, inguinal or axillary lymphadenopathy.   Heart:regular rate and rhythm.  S1 and S2 without leg edema. Lung: Clear without any rhonchi or wheezes.  No dullness to percussion. Abdomin: Soft, nontender,  nondistended with good bowel sounds.  No hepatosplenomegaly. Musculoskeletal: No joint deformity or effusion.  Full range of motion noted. Neurological: No deficits noted on motor, sensory and deep tendon reflex exam. Skin: No petechial rash or dryness.  Appeared moist.     Lab Results: Lab Results  Component Value Date   WBC 5.8 01/14/2018   HGB 12.3 (L) 01/14/2018   HCT 38.3 (L) 01/14/2018   MCV 85.9 01/14/2018   PLT 227 01/14/2018     Chemistry      Component Value Date/Time   NA 142 01/14/2018 0740   NA 138 10/22/2017 1211   NA 139 11/20/2016 0800   K 4.7 01/14/2018 0740   K 4.2 11/20/2016 0800   CL 106 01/14/2018 0740   CO2 27 01/14/2018 0740   CO2 24 11/20/2016 0800   BUN 13 01/14/2018 0740   BUN 19 10/22/2017 1211   BUN 16.7 11/20/2016 0800   CREATININE 0.86 01/14/2018 0740   CREATININE 0.8 11/20/2016 0800      Component Value Date/Time   CALCIUM 9.6 01/14/2018 0740   CALCIUM 9.1 11/20/2016 0800   ALKPHOS 85 01/14/2018 0740   ALKPHOS 58 11/20/2016 0800   AST 18 01/14/2018 0740   AST 18 11/20/2016 0800   ALT 22 01/14/2018 0740   ALT 16 11/20/2016 0800   BILITOT 0.4 01/14/2018 0740   BILITOT 0.40 11/20/2016 0800    Results for Jeff "Decker" (MRN [id number]) as of 01/15/2018 07:53  Ref. Range 02/20/2017 07:43 08/15/2017 08:07 01/14/2018 07:40  Prostate Specific Ag, Serum Latest Ref Range: 0.0 - 4.0 ng/mL <0.1 <0.1 <0.1      Impression and Plan:  72 year old man with:  1.  Advanced prostate cancer diagnosed April 2018 with abdominal adenopathy.  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   His PSA continues to be undetectable without any evidence to suggest relapse disease.  The natural course of his disease as well as alternative treatment options for the future were reviewed.  These options would only be needed if he develops castration resistant disease.  At this time of recommended  androgen deprivation alone.  Before the next visit we will obtain imaging studies to complete his staging in addition to laboratory testing.   2. Androgen deprivation therapy: Risks and benefits of continuing androgen deprivation therapy long-term were reviewed.  These include osteoporosis, sexual dysfunction, hot flashes among others.  He is agreeable to proceed I will receive Lupron today and repeated in 4 months.  3. Bone directed therapy: I recommended continuing calcium and vitamin D supplements.  4.  Prognosis and goals of care: His disease is incurable but is treatable.  His performance status is excellent and aggressive therapy is warranted.   5. Follow-up: 4 months  15  minutes was spent with the patient face-to-face today.  More than 50% of time was dedicated to reviewing his disease status, laboratory data and treatment options the future.   Zola Button, MD 12/18/20197:56 AM

## 2018-01-15 NOTE — Telephone Encounter (Signed)
Printed avs and calender of upcoming appointment. Per 12/18 los 

## 2018-01-17 ENCOUNTER — Encounter (HOSPITAL_COMMUNITY): Payer: Medicare Other

## 2018-01-20 ENCOUNTER — Encounter (HOSPITAL_COMMUNITY)
Admission: RE | Admit: 2018-01-20 | Discharge: 2018-01-20 | Disposition: A | Discharge status: Home / Self Care | Payer: Medicare Other | Source: Ambulatory Visit | Attending: Cardiology | Admitting: Cardiology

## 2018-01-20 ENCOUNTER — Encounter (HOSPITAL_COMMUNITY): Payer: Medicare Other

## 2018-01-20 DIAGNOSIS — Z951 Presence of aortocoronary bypass graft: Secondary | ICD-10-CM

## 2018-01-24 ENCOUNTER — Encounter (HOSPITAL_COMMUNITY): Payer: Medicare Other

## 2018-01-24 ENCOUNTER — Encounter (HOSPITAL_COMMUNITY)
Admission: RE | Admit: 2018-01-24 | Discharge: 2018-01-24 | Disposition: A | Discharge status: Home / Self Care | Payer: Medicare Other | Source: Ambulatory Visit | Attending: Cardiology | Admitting: Cardiology

## 2018-01-24 DIAGNOSIS — Z951 Presence of aortocoronary bypass graft: Secondary | ICD-10-CM

## 2018-01-27 ENCOUNTER — Encounter (HOSPITAL_COMMUNITY): Payer: Medicare Other

## 2018-01-27 ENCOUNTER — Encounter (HOSPITAL_COMMUNITY)
Admission: RE | Admit: 2018-01-27 | Discharge: 2018-01-27 | Disposition: A | Discharge status: Home / Self Care | Payer: Medicare Other | Source: Ambulatory Visit | Attending: Cardiology | Admitting: Cardiology

## 2018-01-27 DIAGNOSIS — Z951 Presence of aortocoronary bypass graft: Secondary | ICD-10-CM

## 2018-01-31 ENCOUNTER — Encounter (HOSPITAL_COMMUNITY): Payer: Medicare Other

## 2018-02-03 ENCOUNTER — Encounter (HOSPITAL_COMMUNITY)
Admission: RE | Admit: 2018-02-03 | Discharge: 2018-02-03 | Disposition: A | Discharge status: Home / Self Care | Payer: Medicare Other | Source: Ambulatory Visit | Attending: Cardiology | Admitting: Cardiology

## 2018-02-03 ENCOUNTER — Encounter (HOSPITAL_COMMUNITY): Payer: Medicare Other

## 2018-02-03 DIAGNOSIS — Z951 Presence of aortocoronary bypass graft: Secondary | ICD-10-CM | POA: Diagnosis not present

## 2018-02-05 ENCOUNTER — Encounter (HOSPITAL_COMMUNITY): Payer: Medicare Other

## 2018-02-05 ENCOUNTER — Encounter (HOSPITAL_COMMUNITY): Payer: Self-pay

## 2018-02-05 ENCOUNTER — Encounter (HOSPITAL_COMMUNITY)
Admission: RE | Admit: 2018-02-05 | Discharge: 2018-02-05 | Disposition: A | Discharge status: Home / Self Care | Payer: Medicare Other | Source: Ambulatory Visit | Attending: Cardiology | Admitting: Cardiology

## 2018-02-05 DIAGNOSIS — Z951 Presence of aortocoronary bypass graft: Secondary | ICD-10-CM

## 2018-02-05 NOTE — Progress Notes (Signed)
Cardiac Individual Treatment Plan  Patient Details  Name: Jeff Decker MRN: [id number] Date of Birth: 03-22-1945 Referring Provider:   Flowsheet Row CARDIAC REHAB PHASE II ORIENTATION from 12/12/2017 in [facility]  Referring Provider  Dr. Marlou Porch      Initial Encounter Date:  Flowsheet Row CARDIAC REHAB PHASE II ORIENTATION from 12/12/2017 in [facility]  Date  12/13/18      Visit Diagnosis: S/P CABG x 4  Patient's Home Medications on Admission:  Current Outpatient Medications:  .  acetaminophen (TYLENOL) 325 MG tablet, Take 2 tablets (650 mg total) by mouth every 6 (six) hours as needed for mild pain., Disp: , Rfl:  .  aspirin EC 81 MG tablet, Take 81 mg by mouth daily., Disp: , Rfl:  .  Calcium Carb-Cholecalciferol (CALCIUM 600+D) 600-800 MG-UNIT TABS, Take 1 tablet by mouth daily. , Disp: , Rfl:  .  Evolocumab (REPATHA SURECLICK) 416 MG/ML SOAJ, Inject 1 pen into the skin every 14 (fourteen) days., Disp: 2 pen, Rfl: 11 .  fexofenadine (ALLEGRA) 180 MG tablet, Take 180 mg by mouth daily., Disp: , Rfl:  .  glucosamine-chondroitin 500-400 MG tablet, Take 1 tablet by mouth daily. , Disp: , Rfl:  .  metoprolol tartrate (LOPRESSOR) 25 MG tablet, Take 0.5 tablets (12.5 mg total) by mouth 2 (two) times daily., Disp: 90 tablet, Rfl: 3 .  Multiple Vitamin (MULTI-VITAMINS) TABS, Take by mouth., Disp: , Rfl:  .  omeprazole (PRILOSEC) 20 MG capsule, Take 20 mg by mouth daily before breakfast., Disp: , Rfl: 1  Past Medical History: Past Medical History:  Diagnosis Date  . Allergy   . Arthritis   . Cancer ([place]) 03/2016   prostate and bone , chemo completed  . Coronary artery disease   . GERD (gastroesophageal reflux disease)   . Hyperlipidemia   . Myocardial infarction ([place]) 1989    Tobacco Use: Social History   Tobacco Use  Smoking Status Former Smoker  . Last attempt to quit: 08/01/1986  . Years since  quitting: 31.5  Smokeless Tobacco Never Used    Labs: Recent Review Flowsheet Data    Labs for ITP Cardiac and Pulmonary Rehab Latest Ref Rng & Units 10/03/2017 10/03/2017 10/03/2017 10/04/2017 12/16/2017   Cholestrol 100 - 199 mg/dL - - - - 130   LDLCALC 0 - 99 mg/dL - - - - 57   HDL >39 mg/dL - - - - 39(L)   Trlycerides 0 - 149 mg/dL - - - - 172(H)   Hemoglobin A1c 4.8 - 5.6 % - - - - -   PHART 7.350 - 7.450 7.447 7.319(L) - - -   PCO2ART 32.0 - 48.0 mmHg 35.6 49.5(H) - - -   HCO3 20.0 - 28.0 mmol/L 24.4 25.2 - - -   TCO2 22 - 32 mmol/L 25 27 25 24  -   ACIDBASEDEF 0.0 - 2.0 mmol/L - 1.0 - - -   O2SAT % 100.0 97.0 - - -      Capillary Blood Glucose: Lab Results  Component Value Date   GLUCAP 122 (H) 10/05/2017   GLUCAP 98 10/05/2017   GLUCAP 114 (H) 10/04/2017   GLUCAP 136 (H) 10/04/2017   GLUCAP 119 (H) 10/04/2017     Exercise Target Goals: Exercise Program Goal: Individual exercise prescription set using results from initial 6 min walk test and THRR while considering  patient's activity barriers and safety.   Exercise Prescription Goal: Initial exercise  prescription builds to 30-45 minutes a day of aerobic activity, 2-3 days per week.  Home exercise guidelines will be given to patient during program as part of exercise prescription that the participant will acknowledge.  Activity Barriers & Risk Stratification: Activity Barriers & Cardiac Risk Stratification - 12/12/17 0944    Activity Barriers & Cardiac Risk Stratification          Activity Barriers  Deconditioning;Muscular Weakness    Cardiac Risk Stratification  High           6 Minute Walk: 6 Minute Walk    6 Minute Walk    Row Name 12/12/17 0943   Phase  Initial   Distance  1491 feet   Walk Time  6 minutes   # of Rest Breaks  0   MPH  2.82   METS  2.91   RPE  6   VO2 Peak  10.17   Symptoms  No   Resting HR  55 bpm   Resting BP  104/72   Resting Oxygen Saturation   99 %   Exercise Oxygen Saturation   during 6 min walk  100 %   Max Ex. HR  78 bpm   Max Ex. BP  120/74   2 Minute Post BP  102/70          Oxygen Initial Assessment:   Oxygen Re-Evaluation:   Oxygen Discharge (Final Oxygen Re-Evaluation):   Initial Exercise Prescription: Initial Exercise Prescription - 12/12/17 1000    Date of Initial Exercise RX and Referring Provider          Date  12/13/18    Referring Provider  Dr. Marlou Porch    Expected Discharge Date  03/20/18        Treadmill          MPH  3    Grade  1    Minutes  10        Bike          Level  1    Minutes  10    METs  3.2        NuStep          Level  4    SPM  85    Minutes  10    METs  3        Prescription Details          Frequency (times per week)  3    Duration  Progress to 30 minutes of continuous aerobic without signs/symptoms of physical distress        Intensity          THRR 40-80% of Max Heartrate  59-118    Ratings of Perceived Exertion  11-13        Progression          Progression  Continue to progress workloads to maintain intensity without signs/symptoms of physical distress.        Resistance Training          Training Prescription  Yes    Weight  4 lbs.     Reps  10-15           Perform Capillary Blood Glucose checks as needed.  Exercise Prescription Changes: Exercise Prescription Changes    Response to Exercise    Row Name 12/18/17 0934 01/06/18 1000 01/28/18 1400   Blood Pressure (Admit)  118/72  104/62  106/58   Blood Pressure (Exercise)  126/70  130/70  138/66  Blood Pressure (Exit)  108/60  104/62  104/60   Heart Rate (Admit)  84 bpm  67 bpm  67 bpm   Heart Rate (Exercise)  96 bpm  119 bpm  117 bpm   Heart Rate (Exit)  75 bpm  77 bpm  73 bpm   Rating of Perceived Exertion (Exercise)  8  11  12    Comments  Pt first day of exercise.   Reviewed HEP  no documentation   Duration  Continue with 30 min of aerobic exercise without signs/symptoms of physical distress.  Continue with 30 min  of aerobic exercise without signs/symptoms of physical distress.  Continue with 30 min of aerobic exercise without signs/symptoms of physical distress.   Intensity  THRR unchanged  THRR unchanged  THRR unchanged       Progression    Row Name 12/18/17 0934 01/06/18 1000 01/28/18 1400   Progression  Continue to progress workloads to maintain intensity without signs/symptoms of physical distress.  Continue to progress workloads to maintain intensity without signs/symptoms of physical distress.  Continue to progress workloads to maintain intensity without signs/symptoms of physical distress.   Average METs  3.4  5  5.3       Resistance Training    Row Name 12/18/17 0934 01/06/18 1000 01/28/18 1400   Training Prescription  No  Yes  Yes   Weight  no documentation  4 lbs.   4 lbs.    Reps  no documentation  10-15  10-15   Time  no documentation  10 Minutes  [place] Name 12/18/17 0934 01/06/18 1000 01/28/18 1400   MPH  3  3.8  3.8   Grade  1  2  2    Minutes  10  10  10        Bike    Row Name 12/18/17 0934 01/06/18 1000 01/28/18 1400   Level  1  2  1.8   Minutes  10  10  10    METs  3.27  5.13  5.08       NuStep    Row Name 12/18/17 0934 01/06/18 1000 01/28/18 1400   Level  4  5  5    SPM  85  85  85   Minutes  10  10  10    METs  3.2  4.9  5.9       Home Exercise Plan    Row Name 12/18/17 0934 01/06/18 1000 01/28/18 1400   Plans to continue exercise at  no documentation  Home (comment)  Home (comment)   Frequency  no documentation  Add 3 additional days to program exercise sessions.  Add 3 additional days to program exercise sessions.   Initial Home Exercises Provided  no documentation  01/06/18  01/06/18          Exercise Comments: Exercise Comments    Row Name 12/18/17 9811 01/06/18 1035 01/07/18 1408 01/28/18 1407   Exercise Comments  Pt first day of exercise. Tolerated exercise well and will continue exercising at home in addition to cardiac  rehab.   Reviewed HEP with Pt. Pt was responsive.   Reviewed METs and goals with Pt. Pt will continue exercising at home in addition to Cardiac Rehab.   Reviewed METs and goals with Pt. Pt will continue exercising at home in addition to Cardiac Rehab.       Exercise Goals and Review: Exercise Goals  Exercise Goals    Row Name 12/12/17 0945   Increase Physical Activity  Yes   Intervention  Provide advice, education, support and counseling about physical activity/exercise needs.;Develop an individualized exercise prescription for aerobic and resistive training based on initial evaluation findings, risk stratification, comorbidities and participant's personal goals.   Expected Outcomes  Short Term: Attend rehab on a regular basis to increase amount of physical activity.   Increase Strength and Stamina  Yes   Intervention  Provide advice, education, support and counseling about physical activity/exercise needs.;Develop an individualized exercise prescription for aerobic and resistive training based on initial evaluation findings, risk stratification, comorbidities and participant's personal goals.   Expected Outcomes  Short Term: Increase workloads from initial exercise prescription for resistance, speed, and METs.   Able to understand and use rate of perceived exertion (RPE) scale  Yes   Intervention  Provide education and explanation on how to use RPE scale   Expected Outcomes  Short Term: Able to use RPE daily in rehab to express subjective intensity level;Long Term:  Able to use RPE to guide intensity level when exercising independently   Knowledge and understanding of Target Heart Rate Range (THRR)  Yes   Intervention  Provide education and explanation of THRR including how the numbers were predicted and where they are located for reference   Expected Outcomes  Short Term: Able to state/look up THRR;Long Term: Able to use THRR to govern intensity when exercising independently;Short Term: Able  to use daily as guideline for intensity in rehab   Able to check pulse independently  Yes   Intervention  Provide education and demonstration on how to check pulse in carotid and radial arteries.;Review the importance of being able to check your own pulse for safety during independent exercise   Expected Outcomes  Short Term: Able to explain why pulse checking is important during independent exercise;Long Term: Able to check pulse independently and accurately   Understanding of Exercise Prescription  Yes   Intervention  Provide education, explanation, and written materials on patient's individual exercise prescription   Expected Outcomes  Short Term: Able to explain program exercise prescription;Long Term: Able to explain home exercise prescription to exercise independently          Exercise Goals Re-Evaluation : Exercise Goals Re-Evaluation    Exercise Goal Re-Evaluation    Row Name 12/18/17 0936 01/06/18 1032 01/07/18 1406 01/28/18 1406   Exercise Goals Review  Increase Physical Activity;Increase Strength and Stamina;Able to check pulse independently;Understanding of Exercise Prescription;Able to understand and use rate of perceived exertion (RPE) scale;Knowledge and understanding of Target Heart Rate Range (THRR)  Increase Physical Activity;Increase Strength and Stamina;Able to understand and use rate of perceived exertion (RPE) scale;Knowledge and understanding of Target Heart Rate Range (THRR);Understanding of Exercise Prescription;Able to check pulse independently  Increase Physical Activity;Increase Strength and Stamina;Able to understand and use rate of perceived exertion (RPE) scale;Knowledge and understanding of Target Heart Rate Range (THRR);Understanding of Exercise Prescription;Able to check pulse independently  Increase Physical Activity;Increase Strength and Stamina;Able to understand and use rate of perceived exertion (RPE) scale;Knowledge and understanding of Target Heart Rate Range  (THRR);Understanding of Exercise Prescription;Able to check pulse independently   Comments  Pt first day of exercise. Pt tolerated exercise well and had an average MET level of 3.4. 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   Reviewed HEP with Pt. Pt was responsive and understand THRR, MET level, exercise precautions,  weather precautions, end points of exercise, warm up and cool down. Pt plans to continue exercising at home in addition to Cardiac Rehab.   Reviewed METs and goals with Pt. Pt is progressing well and is increasing workloads gradually. 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   Reviewed METs and goals with Pt. MET level is 5.3 and progressing. Pt is progressing well and is increasing workloads gradually. 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    Expected Outcomes  Will continue to monitor and progress Pt as tolerated.   Will continue to monitor and progress Pt as tolerated.   Will continue to monitor and progress Pt as tolerated.   Will continue to monitor and progress Pt as tolerated.           Discharge Exercise Prescription (Final Exercise Prescription Changes): Exercise Prescription Changes - 01/28/18 1400    Response to Exercise          Blood Pressure (Admit)  106/58    Blood Pressure (Exercise)  138/66    Blood Pressure (Exit)  104/60    Heart Rate (Admit)  67 bpm    Heart Rate (Exercise)  117 bpm    Heart Rate (Exit)  73 bpm    Rating of Perceived Exertion (Exercise)  12    Duration  Continue with 30 min of aerobic exercise without signs/symptoms of physical distress.    Intensity  THRR unchanged        Progression          Progression  Continue to progress workloads to maintain intensity without signs/symptoms of physical distress.    Average METs  5.3        Resistance Training          Training Prescription  Yes    Weight  4 lbs.     Reps   10-15    Time  10 Minutes        Treadmill          MPH  3.8    Grade  2    Minutes  10        Bike          Level  1.8    Minutes  10    METs  5.08        NuStep          Level  5    SPM  85    Minutes  10    METs  5.9        Home Exercise Plan          Plans to continue exercise at  Home (comment)    Frequency  Add 3 additional days to program exercise sessions.    Initial Home Exercises Provided  01/06/18           Nutrition:  Target Goals: Understanding of nutrition guidelines, daily intake of sodium <1556m, cholesterol <2055m calories 30% from fat and 7% or less from saturated fats, daily to have 5 or more servings of fruits and vegetables.  Biometrics: Pre Biometrics - 12/12/17 0945    Pre Biometrics          Height  5' 10"  (1.778 m)    Weight  83.6 kg    Waist Circumference  39 inches    Hip Circumference  40 inches    Waist to Hip Ratio  0.98 %  BMI (Calculated)  26.44    Triceps Skinfold  22 mm    % Body Fat  28.2 %    Grip Strength  25 kg    Flexibility  10 in    Single Leg Stand  30 seconds            Nutrition Therapy Plan and Nutrition Goals: Nutrition Therapy & Goals - 12/12/17 1025    Nutrition Therapy          Diet  heart healthy        Personal Nutrition Goals          Nutrition Goal  Pt to identify and limit food sources of saturated fat, trans fat, refined carbohydrates and sodium    Personal Goal #2  Pt able to name foods that affect blood glucose        Intervention Plan          Intervention  Prescribe, educate and counsel regarding individualized specific dietary modifications aiming towards targeted core components such as weight, hypertension, lipid management, diabetes, heart failure and other comorbidities.    Expected Outcomes  Short Term Goal: Understand basic principles of dietary content, such as calories, fat, sodium, cholesterol and nutrients.;Long Term Goal: Adherence to prescribed nutrition plan.            Nutrition Assessments: Nutrition Assessments - 12/12/17 1027    MEDFICTS Scores          Pre Score  41           Nutrition Goals Re-Evaluation:   Nutrition Goals Re-Evaluation:   Nutrition Goals Discharge (Final Nutrition Goals Re-Evaluation):   Psychosocial: Target Goals: Acknowledge presence or absence of significant depression and/or stress, maximize coping skills, provide positive support system. Participant is able to verbalize types and ability to use techniques and skills needed for reducing stress and depression.  Initial Review & Psychosocial Screening: Initial Psych Review & Screening - 12/12/17 1204    Initial Review          Current issues with  None Identified        Family Dynamics          Good Support System?  Yes        Barriers          Psychosocial barriers to participate in program  There are no identifiable barriers or psychosocial needs.        Screening Interventions          Interventions  Encouraged to exercise           Quality of Life Scores: Quality of Life - 12/12/17 0918    Quality of Life          Select  Quality of Life        Quality of Life Scores          Health/Function Pre  27.2 %    Socioeconomic Pre  28.93 %    Psych/Spiritual Pre  29.14 %    Family Pre  28.8 %    GLOBAL Pre  28.19 %          Scores of 19 and below usually indicate a poorer quality of life in these areas.  A difference of  2-3 points is a clinically meaningful difference.  A difference of 2-3 points in the total score of the Quality of Life Index has been associated with significant improvement in overall quality of life, self-image, physical symptoms, and general health in studies  assessing change in quality of life.  PHQ-9: Recent Review Flowsheet Data    Depression screen [facility] Fall 2/9 12/18/2017   Decreased Interest 0   Down, Depressed, Hopeless 0   PHQ - 2 Score 0     Interpretation of Total Score  Total Score Depression  Severity:  1-4 = Minimal depression, 5-9 = Mild depression, 10-14 = Moderate depression, 15-19 = Moderately severe depression, 20-27 = Severe depression   Psychosocial Evaluation and Intervention: Psychosocial Evaluation - 12/18/17 0934    Psychosocial Evaluation & Interventions          Interventions  Encouraged to exercise with the program and follow exercise prescription    Comments  no psychosocial needs identified, no interventions necessary.  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      Expected Outcomes  pt will exhibit positive outlook with good coping skills    Continue Psychosocial Services   No Follow up required           Psychosocial Re-Evaluation: Psychosocial Re-Evaluation    Psychosocial Re-Evaluation    Row Name 01/03/18 0734 02/03/18 1112   Current issues with  None Identified  None Identified   Comments  no psychosocial needs identified, no interventions necessary   no psychosocial needs identified, no interventions necessary    Expected Outcomes  pt will exhibit positive outlook with good coping skills  pt will exhibit positive outlook with good coping skills   Interventions  Encouraged to attend Cardiac Rehabilitation for the exercise  Encouraged to attend Cardiac Rehabilitation for the exercise   Continue Psychosocial Services   No Follow up required  No Follow up required          Psychosocial Discharge (Final Psychosocial Re-Evaluation): Psychosocial Re-Evaluation - 02/03/18 1112    Psychosocial Re-Evaluation          Current issues with  None Identified    Comments  no psychosocial needs identified, no interventions necessary     Expected Outcomes  pt will exhibit positive outlook with good coping skills    Interventions  Encouraged to attend Cardiac Rehabilitation for the exercise    Continue Psychosocial Services   No Follow up required           Vocational Rehabilitation: Provide vocational rehab assistance to qualifying  candidates.   Vocational Rehab Evaluation & Intervention: Vocational Rehab - 12/12/17 0919    Initial Vocational Rehab Evaluation & Intervention          Assessment shows need for Vocational Rehabilitation  No           Education: Education Goals: Education classes will be provided on a weekly basis, covering required topics. Participant will state understanding/return demonstration of topics presented.  Learning Barriers/Preferences: Learning Barriers/Preferences - 12/12/17 0946    Learning Barriers/Preferences          Learning Barriers  Sight;Hearing    Learning Preferences  Pictoral;Written Material           Education Topics: Count Your Pulse:  -Group instruction provided by verbal instruction, demonstration, patient participation and written materials to support subject.  Instructors address importance of being able to find your pulse and how to count your pulse when at home without a heart monitor.  Patients get hands on experience counting their pulse with staff help and individually.   Heart Attack, Angina, and Risk Factor Modification:  -Group instruction provided by verbal instruction, video, and written materials to support subject.  Instructors address signs and  symptoms of angina and heart attacks.    Also discuss risk factors for heart disease and how to make changes to improve heart health risk factors. Flowsheet Row CARDIAC REHAB PHASE II EXERCISE from 01/10/2018 in [place]  Date  01/08/18  Educator  RN  Instruction Review Code  2- Demonstrated Understanding      Functional Fitness:  -Group instruction provided by verbal instruction, demonstration, patient participation, and written materials to support subject.  Instructors address safety measures for doing things around the house.  Discuss how to get up and down off the floor, how to pick things up properly, how to safely get out of a chair without assistance, and balance  training. Flowsheet Row CARDIAC REHAB PHASE II EXERCISE from 01/10/2018 in [place]  Date  01/10/18  Educator  EP  Instruction Review Code  2- Demonstrated Understanding      Meditation and Mindfulness:  -Group instruction provided by verbal instruction, patient participation, and written materials to support subject.  Instructor addresses importance of mindfulness and meditation practice to help reduce stress and improve awareness.  Instructor also leads participants through a meditation exercise.    Stretching for Flexibility and Mobility:  -Group instruction provided by verbal instruction, patient participation, and written materials to support subject.  Instructors lead participants through series of stretches that are designed to increase flexibility thus improving mobility.  These stretches are additional exercise for major muscle groups that are typically performed during regular warm up and cool down.   Hands Only CPR:  -Group verbal, video, and participation provides a basic overview of AHA guidelines for community CPR. Role-play of emergencies allow participants the opportunity to practice calling for help and chest compression technique with discussion of AED use.   Hypertension: -Group verbal and written instruction that provides a basic overview of hypertension including the most recent diagnostic guidelines, risk factor reduction with self-care instructions and medication management. Flowsheet Row CARDIAC REHAB PHASE II EXERCISE from 01/10/2018 in [place]  Date  12/18/17  Educator  RN  Instruction Review Code  2- Demonstrated Understanding       Nutrition I class: Heart Healthy Eating:  -Group instruction provided by PowerPoint slides, verbal discussion, and written materials to support subject matter. The instructor gives an explanation and review of the Therapeutic Lifestyle Changes diet  recommendations, which includes a discussion on lipid goals, dietary fat, sodium, fiber, plant stanol/sterol esters, sugar, and the components of a well-balanced, healthy diet.   Nutrition II class: Lifestyle Skills:  -Group instruction provided by PowerPoint slides, verbal discussion, and written materials to support subject matter. The instructor gives an explanation and review of label reading, grocery shopping for heart health, heart healthy recipe modifications, and ways to make healthier choices when eating out.   Diabetes Question & Answer:  -Group instruction provided by PowerPoint slides, verbal discussion, and written materials to support subject matter. The instructor gives an explanation and review of diabetes co-morbidities, pre- and post-prandial blood glucose goals, pre-exercise blood glucose goals, signs, symptoms, and treatment of hypoglycemia and hyperglycemia, and foot care basics.   Diabetes Blitz:  -Group instruction provided by PowerPoint slides, verbal discussion, and written materials to support subject matter. The instructor gives an explanation and review of the physiology behind type 1 and type 2 diabetes, diabetes medications and rational behind using different medications, pre- and post-prandial blood glucose recommendations and Hemoglobin A1c goals, diabetes diet, and exercise including  blood glucose guidelines for exercising safely.    Portion Distortion:  -Group instruction provided by PowerPoint slides, verbal discussion, written materials, and food models to support subject matter. The instructor gives an explanation of serving size versus portion size, changes in portions sizes over the last 20 years, and what consists of a serving from each food group.   Stress Management:  -Group instruction provided by verbal instruction, video, and written materials to support subject matter.  Instructors review role of stress in heart disease and how to cope with stress  positively.     Exercising on Your Own:  -Group instruction provided by verbal instruction, power point, and written materials to support subject.  Instructors discuss benefits of exercise, components of exercise, frequency and intensity of exercise, and end points for exercise.  Also discuss use of nitroglycerin and activating EMS.  Review options of places to exercise outside of rehab.  Review guidelines for sex with heart disease.   Cardiac Drugs I:  -Group instruction provided by verbal instruction and written materials to support subject.  Instructor reviews cardiac drug classes: antiplatelets, anticoagulants, beta blockers, and statins.  Instructor discusses reasons, side effects, and lifestyle considerations for each drug class.   Cardiac Drugs II:  -Group instruction provided by verbal instruction and written materials to support subject.  Instructor reviews cardiac drug classes: angiotensin converting enzyme inhibitors (ACE-I), angiotensin II receptor blockers (ARBs), nitrates, and calcium channel blockers.  Instructor discusses reasons, side effects, and lifestyle considerations for each drug class. Flowsheet Row CARDIAC REHAB PHASE II EXERCISE from 01/10/2018 in [place]  Date  01/01/18  Educator  Pharmacist   Instruction Review Code  2- Demonstrated Understanding      Anatomy and Physiology of the Circulatory System:  Group verbal and written instruction and models provide basic cardiac anatomy and physiology, with the coronary electrical and arterial systems. Review of: AMI, Angina, Valve disease, Heart Failure, Peripheral Artery Disease, Cardiac Arrhythmia, Pacemakers, and the ICD. Flowsheet Row CARDIAC REHAB PHASE II EXERCISE from 01/10/2018 in [place]  Date  12/25/17  Educator  RN  Instruction Review Code  2- Demonstrated Understanding      Other Education:  -Group or individual verbal, written, or  video instructions that support the educational goals of the cardiac rehab program.   Holiday Eating Survival Tips:  -Group instruction provided by PowerPoint slides, verbal discussion, and written materials to support subject matter. The instructor gives patients tips, tricks, and techniques to help them not only survive but enjoy the holidays despite the onslaught of food that accompanies the holidays.   Knowledge Questionnaire Score: Knowledge Questionnaire Score - 12/12/17 0919    Knowledge Questionnaire Score          Pre Score  22/24           Core Components/Risk Factors/Patient Goals at Admission: Personal Goals and Risk Factors at Admission - 12/12/17 0947    Core Components/Risk Factors/Patient Goals on Admission           Weight Management  Yes;Weight Loss;Weight Maintenance    Intervention  Weight Management: Develop a combined nutrition and exercise program designed to reach desired caloric intake, while maintaining appropriate intake of nutrient and fiber, sodium and fats, and appropriate energy expenditure required for the weight goal.;Weight Management: Provide education and appropriate resources to help participant work on and attain dietary goals.;Weight Management/Obesity: Establish reasonable short term and long term weight goals.    Admit  Weight  184 lb 4.9 oz (83.6 kg)    Expected Outcomes  Short Term: Continue to assess and modify interventions until short term weight is achieved;Long Term: Adherence to nutrition and physical activity/exercise program aimed toward attainment of established weight goal;Weight Maintenance: Understanding of the daily nutrition guidelines, which includes 25-35% calories from fat, 7% or less cal from saturated fats, less than 256m cholesterol, less than 1.5gm of sodium, & 5 or more servings of fruits and vegetables daily;Weight Loss: Understanding of general recommendations for a balanced deficit meal plan, which promotes 1-2 lb weight loss  per week and includes a negative energy balance of [phone] kcal/d;Understanding recommendations for meals to include 15-35% energy as protein, 25-35% energy from fat, 35-60% energy from carbohydrates, less than 2013mof dietary cholesterol, 20-35 gm of total fiber daily;Understanding of distribution of calorie intake throughout the day with the consumption of 4-5 meals/snacks    Hypertension  Yes    Intervention  Provide education on lifestyle modifcations including regular physical activity/exercise, weight management, moderate sodium restriction and increased consumption of fresh fruit, vegetables, and low fat dairy, alcohol moderation, and smoking cessation.;Monitor prescription use compliance.    Expected Outcomes  Short Term: Continued assessment and intervention until BP is < 140/9083mG in hypertensive participants. < 130/30m25m in hypertensive participants with diabetes, heart failure or chronic kidney disease.;Long Term: Maintenance of blood pressure at goal levels.    Lipids  Yes    Intervention  Provide education and support for participant on nutrition & aerobic/resistive exercise along with prescribed medications to achieve LDL <70mg88mL >40mg.56mExpected Outcomes  Short Term: Participant states understanding of desired cholesterol values and is compliant with medications prescribed. Participant is following exercise prescription and nutrition guidelines.;Long Term: Cholesterol controlled with medications as prescribed, with individualized exercise RX and with personalized nutrition plan. Value goals: LDL < 70mg, 51m> 40 mg.    Stress  Yes    Intervention  Offer individual and/or small group education and counseling on adjustment to heart disease, stress management and health-related lifestyle change. Teach and support self-help strategies.;Refer participants experiencing significant psychosocial distress to appropriate mental health specialists for further evaluation and treatment. When  possible, include family members and significant others in education/counseling sessions.    Expected Outcomes  Short Term: Participant demonstrates changes in health-related behavior, relaxation and other stress management skills, ability to obtain effective social support, and compliance with psychotropic medications if prescribed.;Long Term: Emotional wellbeing is indicated by absence of clinically significant psychosocial distress or social isolation.           Core Components/Risk Factors/Patient Goals Review:  Goals and Risk Factor Review    Core Components/Risk Factors/Patient Goals Review    Row Name 12/18/17 0936 01/03/18 0734 02/03/18 1112   Personal Goals Review  Weight Management/Obesity;Hypertension;Lipids;Stress  Weight Management/Obesity;Hypertension;Lipids;Stress  Weight Management/Obesity;Hypertension;Lipids;Stress   Review  pt with CAD RF demonstrates eagerness to participate in CR program.  pt personal goals are to lose 10lb and resume previous activities.   pt with CAD RF demonstrates eagerness to participate in CR program.  pt personal goals are to lose 10lb and resume previous activities. pt is eager to be able to walk further with less DOE.  pt desires to walk uphill and downhill without difficulty.   pt with CAD RF demonstrates eagerness to participate in CR program.  pt personal goals are to lose 10lb and resume previous activities. pt is eager to be able to walk further with  less DOE.  pt desires to walk uphill and downhill without difficulty. pt is currently walking 30 minutes using Treadmill at home.  pt continues striving for weight loss goal.    Expected Outcomes  pt will participate in CR exercise, nutrition and lifestyle modificaiton for overall RF reducation.   pt will participate in CR exercise, nutrition and lifestyle modificaiton for overall RF reducation.   pt will participate in CR exercise, nutrition and lifestyle modificaiton for overall RF reducation.            Core Components/Risk Factors/Patient Goals at Discharge (Final Review):  Goals and Risk Factor Review - 02/03/18 1112    Core Components/Risk Factors/Patient Goals Review          Personal Goals Review  Weight Management/Obesity;Hypertension;Lipids;Stress    Review  pt with CAD RF demonstrates eagerness to participate in CR program.  pt personal goals are to lose 10lb and resume previous activities. pt is eager to be able to walk further with less DOE.  pt desires to walk uphill and downhill without difficulty. pt is currently walking 30 minutes using Treadmill at home.  pt continues striving for weight loss goal.     Expected Outcomes  pt will participate in CR exercise, nutrition and lifestyle modificaiton for overall RF reducation.            ITP Comments: ITP Comments    Row Name 12/12/17 [phone] 12/18/17 0941 01/03/18 0731 02/05/18 1506   ITP Comments  Dr. Fransico Him Medical Director  pt started group exercise. pt tolerated light activity without difficulty. pt oriented to exercise equipment and safety routine.   30 day ITP review.  pt with good attendance and participation.   30 day ITP review.  pt with good attendance and participation.       Comments:

## 2018-02-07 ENCOUNTER — Encounter (HOSPITAL_COMMUNITY): Payer: Medicare Other

## 2018-02-07 ENCOUNTER — Encounter (HOSPITAL_COMMUNITY)
Admission: RE | Admit: 2018-02-07 | Discharge: 2018-02-07 | Disposition: A | Discharge status: Home / Self Care | Payer: Medicare Other | Source: Ambulatory Visit | Attending: Cardiology | Admitting: Cardiology

## 2018-02-07 DIAGNOSIS — Z951 Presence of aortocoronary bypass graft: Secondary | ICD-10-CM | POA: Diagnosis not present

## 2018-02-10 ENCOUNTER — Encounter (HOSPITAL_COMMUNITY)
Admission: RE | Admit: 2018-02-10 | Discharge: 2018-02-10 | Disposition: A | Discharge status: Home / Self Care | Payer: Medicare Other | Source: Ambulatory Visit | Attending: Cardiology | Admitting: Cardiology

## 2018-02-10 ENCOUNTER — Encounter (HOSPITAL_COMMUNITY): Payer: Medicare Other

## 2018-02-10 DIAGNOSIS — Z951 Presence of aortocoronary bypass graft: Secondary | ICD-10-CM

## 2018-02-12 ENCOUNTER — Encounter (HOSPITAL_COMMUNITY): Payer: Medicare Other

## 2018-02-12 ENCOUNTER — Encounter (HOSPITAL_COMMUNITY)
Admission: RE | Admit: 2018-02-12 | Discharge: 2018-02-12 | Disposition: A | Discharge status: Home / Self Care | Payer: Medicare Other | Source: Ambulatory Visit | Attending: Cardiology | Admitting: Cardiology

## 2018-02-12 DIAGNOSIS — Z951 Presence of aortocoronary bypass graft: Secondary | ICD-10-CM | POA: Diagnosis not present

## 2018-02-14 ENCOUNTER — Encounter (HOSPITAL_COMMUNITY): Payer: Medicare Other

## 2018-02-17 ENCOUNTER — Encounter (HOSPITAL_COMMUNITY)
Admission: RE | Admit: 2018-02-17 | Discharge: 2018-02-17 | Disposition: A | Discharge status: Home / Self Care | Payer: Medicare Other | Source: Ambulatory Visit | Attending: Cardiology | Admitting: Cardiology

## 2018-02-17 ENCOUNTER — Encounter (HOSPITAL_COMMUNITY): Payer: Medicare Other

## 2018-02-17 DIAGNOSIS — Z951 Presence of aortocoronary bypass graft: Secondary | ICD-10-CM | POA: Diagnosis not present

## 2018-02-18 ENCOUNTER — Encounter: Payer: Self-pay | Admitting: Nurse Practitioner

## 2018-02-18 ENCOUNTER — Ambulatory Visit (INDEPENDENT_AMBULATORY_CARE_PROVIDER_SITE_OTHER): Payer: Medicare Other | Admitting: Nurse Practitioner

## 2018-02-18 VITALS — BP 118/68 | HR 65 | Ht 71.0 in | Wt 185.1 lb

## 2018-02-18 DIAGNOSIS — I259 Chronic ischemic heart disease, unspecified: Secondary | ICD-10-CM | POA: Diagnosis not present

## 2018-02-18 DIAGNOSIS — E782 Mixed hyperlipidemia: Secondary | ICD-10-CM | POA: Diagnosis not present

## 2018-02-18 DIAGNOSIS — Z951 Presence of aortocoronary bypass graft: Secondary | ICD-10-CM

## 2018-02-18 NOTE — Patient Instructions (Addendum)
We will be checking the following labs today - NONE   Medication Instructions:    Continue with your current medicines.    If you need a refill on your cardiac medications before your next appointment, please call your pharmacy.     Testing/Procedures To Be Arranged:  N/A  Follow-Up:   See Dr. Marlou Porch in 6 months; me in 12 months  Has lab appointment in April    At [facility], you and your health needs are our priority.  As part of our continuing mission to provide you with exceptional heart care, we have created designated Provider Care Teams.  These Care Teams include your primary Cardiologist (physician) and Advanced Practice Providers (APPs -  Physician Assistants and Nurse Practitioners) who all work together to provide you with the care you need, when you need it.  Special Instructions:  . None  Call the [place] office at [phone] if you have any questions, problems or concerns.

## 2018-02-18 NOTE — Progress Notes (Signed)
CARDIOLOGY OFFICE NOTE  Date:  02/18/2018    Jeff Decker Date of Birth: 08/25/1945 Medical Record #[id number]  PCP:  [name]  Cardiologist:  [place]    Chief Complaint  Patient presents with  . Coronary Artery Disease    3 month check - seen for Dr. Marlou Porch    History of Present Illness: Jeff Decker is a 73 y.o. male who presents today for a 3 month check. Seen for Dr. Marlou Porch.   He has a history of CAD with prior CABG x4 with LIMA to LAD, SVG to diagonal, SVG to distal left circumflex, SVG to PDA on 10/03/2017 by Dr. Cyndia Bent. His other issues include HLD - statin intolerance - on Repatha. He has an ischemic CM with an EF of approximately 35 to 45% prior to CABG.  After CABG, EF was restored to low normal 50 to 55%.  Last seen by Dr. Marlou Porch in October - was doing well. Enjoys riding horses.   Comes in today. Here alone today.  He is doing well. No chest pain. Breathing is good. He is continuing to enjoy rehab. He is also walking on his treadmill - did 40 minutes "Sunday. Not lightheaded or dizzy. Has had lab in [facility] from December - lipids are excellent - LDL in the 50's. He has no real concerns - is asking about lifting restrictions and if he can go back to riding horses. He is trying to work on weight - loves sweet tea but has cut back on the sugar added when making.   Past Medical History:  Diagnosis Date  . Allergy   . Arthritis   . Cancer (HCC) 03/2016   prostate and bone , chemo completed  . Coronary artery disease   . GERD (gastroesophageal reflux disease)   . Hyperlipidemia   . Myocardial infarction (HCC) 1989    Past Surgical History:  Procedure Laterality Date  . COLONOSCOPY  2006  . CORONARY ANGIOPLASTY WITH STENT PLACEMENT    . CORONARY ARTERY BYPASS GRAFT N/A 10/03/2017   Procedure: CORONARY ARTERY BYPASS GRAFTING (CABG) x 4, USING LEFT INTERNAL MAMMARY ARTERY AND RIGHT GREATER SAPHENOUS  VEIN HARVESTED ENDOSCOPICALLY. LIMA TO LAD, SVG TO DIAG, SVG TO OM, SVG TO PDA.;  Surgeon: Bartle, Bryan K, MD;  Location: [facility] OR;  Service: Open Heart Surgery;  Laterality: N/A;  . HEMORRHOID SURGERY    . LEFT HEART CATH AND CORONARY ANGIOGRAPHY N/A 10/01/2017   Procedure: LEFT HEART CATH AND CORONARY ANGIOGRAPHY;  Surgeon: Jordan, Peter M, MD;  Location: [facility] INVASIVE CV LAB;  Service: Cardiovascular;  Laterality: N/A;  . ORIF SHOULDER FRACTURE     12"  years old  . PROSTATE BIOPSY  03/2016  . TEE WITHOUT CARDIOVERSION N/A 10/03/2017   Procedure: TRANSESOPHAGEAL ECHOCARDIOGRAM (TEE);  Surgeon: Gaye Pollack, MD;  Location: [facility];  Service: Open Heart Surgery;  Laterality: N/A;  . TONSILLECTOMY AND ADENOIDECTOMY     73 years old     Medications: Current Meds  Medication Sig  . acetaminophen (TYLENOL) 325 MG tablet Take 2 tablets (650 mg total) by mouth every 6 (six) hours as needed for mild pain.  Marland Kitchen aspirin EC 81 MG tablet Take 81 mg by mouth daily.  . Calcium Carb-Cholecalciferol (CALCIUM 600+D) 600-800 MG-UNIT TABS Take 1 tablet by mouth daily.   . Evolocumab (REPATHA SURECLICK) 017 MG/ML SOAJ Inject 1 pen into the skin every 14 (fourteen) days.  . fexofenadine (ALLEGRA)  180 MG tablet Take 180 mg by mouth daily.  Marland Kitchen glucosamine-chondroitin 500-400 MG tablet Take 1 tablet by mouth daily.   . metoprolol tartrate (LOPRESSOR) 25 MG tablet Take 0.5 tablets (12.5 mg total) by mouth 2 (two) times daily.  . Multiple Vitamin (MULTI-VITAMINS) TABS Take by mouth.  Marland Kitchen omeprazole (PRILOSEC) 20 MG capsule Take 20 mg by mouth daily before breakfast.     Allergies: Allergies  Allergen Reactions  . Statins Other (See Comments)    Muscle pain Tried 3 different statins    Social History: The patient  reports that he quit smoking about 31 years ago. He has never used smokeless tobacco. He reports current alcohol use of about 4.0 standard drinks of alcohol per week. He reports that he does not use drugs.     Family History: The patient's family history includes Breast cancer in his mother; Colon cancer (age of onset: 21) in his father; Prostate cancer in his father.   Review of Systems: Please see the history of present illness.   Otherwise, the review of systems is positive for none.   All other systems are reviewed and negative.   Physical Exam: VS:  BP 118/68 (BP Location: Left Arm, Patient Position: Sitting, Cuff Size: Normal)   Pulse 65   Ht 5\' 11"  (1.803 m)   Wt 185 lb 1.9 oz (84 kg)   SpO2 97% Comment: at rest  BMI 25.82 kg/m  .  BMI Body mass index is 25.82 kg/m.  Wt Readings from Last 3 Encounters:  02/18/18 185 lb 1.9 oz (84 kg)  01/15/18 184 lb 1.6 oz (83.5 kg)  12/12/17 184 lb 4.9 oz (83.6 kg)    General: Pleasant. Well developed, well nourished and in no acute distress.   HEENT: Normal.  Neck: Supple, no JVD, carotid bruits, or masses noted.  Cardiac: Regular rate and rhythm. No murmurs, rubs, or gallops. No edema.  Respiratory:  Lungs are clear to auscultation bilaterally with normal work of breathing.  GI: Soft and nontender.  MS: No deformity or atrophy. Gait and ROM intact.  Skin: Warm and dry. Color is normal.  Neuro:  Strength and sensation are intact and no gross focal deficits noted.  Psych: Alert, appropriate and with normal affect.   LABORATORY DATA:  EKG:  EKG is not ordered today.  Lab Results  Component Value Date   WBC 5.8 01/14/2018   HGB 12.3 (L) 01/14/2018   HCT 38.3 (L) 01/14/2018   PLT 227 01/14/2018   GLUCOSE 112 (H) 01/14/2018   CHOL 130 12/16/2017   TRIG 172 (H) 12/16/2017   HDL 39 (L) 12/16/2017   LDLCALC 57 12/16/2017   ALT 22 01/14/2018   AST 18 01/14/2018   NA 142 01/14/2018   K 4.7 01/14/2018   CL 106 01/14/2018   CREATININE 0.86 01/14/2018   BUN 13 01/14/2018   CO2 27 01/14/2018   INR 1.22 10/03/2017   HGBA1C 5.7 (H) 10/02/2017     BNP (last 3 results) No results for input(s): BNP in the last 8760 hours.  ProBNP  (last 3 results) No results for input(s): PROBNP in the last 8760 hours.   Other Studies Reviewed Today:  Echo Study Conclusions 09/2017  - Left ventricle: The cavity size was normal. Wall thickness was   normal. Systolic function was normal. The estimated ejection   fraction was in the range of 50% to 55%. Wall motion was normal;   there were no regional wall motion abnormalities. Doppler  parameters are consistent with abnormal left ventricular   relaxation (grade 1 diastolic dysfunction). - Aortic valve: There was trivial regurgitation. - Right atrium: The atrium was mildly dilated. - Pulmonic valve: There was moderate regurgitation.   Assessment/Plan:  1. CAD with prior CABG x 4 from September 2019 - doing well clinically - continue with CV risk factor modification. Lifting restrictions currently nothing over 40#. May resume riding horses - understands that if this is uncomfortable - to stop.   2. Ischemic CM - EF has recovered - low normal - he is totally asymptomatic. No changes made today. BP soft for ACE/ARB. He is only on Lopressor. He has been revascularized.   3. HLD - on Repatha - followed in the lipid clinic - has also had recent labs with his PCP noted in [place].   4. Carotid disease - to consider repeat study in 2021  Current medicines are reviewed with the patient today.  The patient does not have concerns regarding medicines other than what has been noted above.  The following changes have been made:  See above.  Labs/ tests ordered today include:   No orders of the defined types were placed in this encounter.    Disposition:   FU with Dr. Marlou Porch in 6 months and me in 12 months.    Patient is agreeable to this plan and will call if any problems develop in the interim.   SignedTruitt Merle, NP  02/18/2018 8:20 AM  [facility] [address] [place], [place]  [address] Phone: [phone] Fax: [phone]

## 2018-02-19 ENCOUNTER — Encounter (HOSPITAL_COMMUNITY): Payer: Medicare Other

## 2018-02-21 ENCOUNTER — Encounter (HOSPITAL_COMMUNITY): Payer: Medicare Other

## 2018-02-21 ENCOUNTER — Encounter (HOSPITAL_COMMUNITY)
Admission: RE | Admit: 2018-02-21 | Discharge: 2018-02-21 | Disposition: A | Discharge status: Home / Self Care | Payer: Medicare Other | Source: Ambulatory Visit | Attending: Cardiology | Admitting: Cardiology

## 2018-02-21 DIAGNOSIS — Z951 Presence of aortocoronary bypass graft: Secondary | ICD-10-CM | POA: Diagnosis not present

## 2018-02-24 ENCOUNTER — Encounter (HOSPITAL_COMMUNITY): Payer: Medicare Other

## 2018-02-24 ENCOUNTER — Encounter (HOSPITAL_COMMUNITY)
Admission: RE | Admit: 2018-02-24 | Discharge: 2018-02-24 | Disposition: A | Discharge status: Home / Self Care | Payer: Medicare Other | Source: Ambulatory Visit | Attending: Cardiology | Admitting: Cardiology

## 2018-02-24 DIAGNOSIS — Z951 Presence of aortocoronary bypass graft: Secondary | ICD-10-CM

## 2018-02-26 ENCOUNTER — Encounter (HOSPITAL_COMMUNITY)
Admission: RE | Admit: 2018-02-26 | Discharge: 2018-02-26 | Disposition: A | Discharge status: Home / Self Care | Payer: Medicare Other | Source: Ambulatory Visit | Attending: Cardiology | Admitting: Cardiology

## 2018-02-26 ENCOUNTER — Encounter (HOSPITAL_COMMUNITY): Payer: Medicare Other

## 2018-02-26 DIAGNOSIS — Z951 Presence of aortocoronary bypass graft: Secondary | ICD-10-CM | POA: Diagnosis not present

## 2018-02-28 ENCOUNTER — Encounter (HOSPITAL_COMMUNITY)
Admission: RE | Admit: 2018-02-28 | Discharge: 2018-02-28 | Disposition: A | Discharge status: Home / Self Care | Payer: Medicare Other | Source: Ambulatory Visit | Attending: Cardiology | Admitting: Cardiology

## 2018-02-28 ENCOUNTER — Encounter (HOSPITAL_COMMUNITY): Payer: Medicare Other

## 2018-03-03 ENCOUNTER — Encounter (HOSPITAL_COMMUNITY): Payer: Medicare Other

## 2018-03-05 ENCOUNTER — Encounter (HOSPITAL_COMMUNITY): Payer: Medicare Other

## 2018-03-05 ENCOUNTER — Telehealth (HOSPITAL_COMMUNITY): Payer: Self-pay

## 2018-03-05 NOTE — Telephone Encounter (Signed)
Phone call to Pt inquiring about absence from cardiac rehab. Pt stated he was diagnosed with the flu on Friday 2/31. Pt stated he should be able to return on Friday. Reminded Pt of 48 hour symptom free before he returns.   Deitra Mayo BS, ACSM CEP

## 2018-03-06 ENCOUNTER — Ambulatory Visit (HOSPITAL_COMMUNITY): Payer: Self-pay | Admitting: Cardiac Rehabilitation

## 2018-03-06 NOTE — Progress Notes (Signed)
Cardiac Individual Treatment Plan  Patient Details  Name: Fuad Forget MRN: [id number] Date of Birth: 1945/07/13 Referring Provider:   Flowsheet Row CARDIAC REHAB PHASE II ORIENTATION from 12/12/2017 in [facility]  Referring Provider  Dr. Marlou Porch      Initial Encounter Date:  Flowsheet Row CARDIAC REHAB PHASE II ORIENTATION from 12/12/2017 in [facility]  Date  12/13/18      Visit Diagnosis: S/P CABG x 4  Patient's Home Medications on Admission:  Current Outpatient Medications:  .  acetaminophen (TYLENOL) 325 MG tablet, Take 2 tablets (650 mg total) by mouth every 6 (six) hours as needed for mild pain., Disp: , Rfl:  .  aspirin EC 81 MG tablet, Take 81 mg by mouth daily., Disp: , Rfl:  .  Calcium Carb-Cholecalciferol (CALCIUM 600+D) 600-800 MG-UNIT TABS, Take 1 tablet by mouth daily. , Disp: , Rfl:  .  Evolocumab (REPATHA SURECLICK) 793 MG/ML SOAJ, Inject 1 pen into the skin every 14 (fourteen) days., Disp: 2 pen, Rfl: 11 .  fexofenadine (ALLEGRA) 180 MG tablet, Take 180 mg by mouth daily., Disp: , Rfl:  .  glucosamine-chondroitin 500-400 MG tablet, Take 1 tablet by mouth daily. , Disp: , Rfl:  .  metoprolol tartrate (LOPRESSOR) 25 MG tablet, Take 0.5 tablets (12.5 mg total) by mouth 2 (two) times daily., Disp: 90 tablet, Rfl: 3 .  Multiple Vitamin (MULTI-VITAMINS) TABS, Take by mouth., Disp: , Rfl:  .  omeprazole (PRILOSEC) 20 MG capsule, Take 20 mg by mouth daily before breakfast., Disp: , Rfl: 1  Past Medical History: Past Medical History:  Diagnosis Date  . Allergy   . Arthritis   . Cancer ([place]) 03/2016   prostate and bone , chemo completed  . Coronary artery disease   . GERD (gastroesophageal reflux disease)   . Hyperlipidemia   . Myocardial infarction ([place]) 1989    Tobacco Use: Social History   Tobacco Use  Smoking Status Former Smoker  . Last attempt to quit: 08/01/1986  . Years since  quitting: 31.6  Smokeless Tobacco Never Used    Labs: Recent Review Flowsheet Data    Labs for ITP Cardiac and Pulmonary Rehab Latest Ref Rng & Units 10/03/2017 10/03/2017 10/03/2017 10/04/2017 12/16/2017   Cholestrol 100 - 199 mg/dL - - - - 130   LDLCALC 0 - 99 mg/dL - - - - 57   HDL >39 mg/dL - - - - 39(L)   Trlycerides 0 - 149 mg/dL - - - - 172(H)   Hemoglobin A1c 4.8 - 5.6 % - - - - -   PHART 7.350 - 7.450 7.447 7.319(L) - - -   PCO2ART 32.0 - 48.0 mmHg 35.6 49.5(H) - - -   HCO3 20.0 - 28.0 mmol/L 24.4 25.2 - - -   TCO2 22 - 32 mmol/L 25 27 25 24  -   ACIDBASEDEF 0.0 - 2.0 mmol/L - 1.0 - - -   O2SAT % 100.0 97.0 - - -      Capillary Blood Glucose: Lab Results  Component Value Date   GLUCAP 122 (H) 10/05/2017   GLUCAP 98 10/05/2017   GLUCAP 114 (H) 10/04/2017   GLUCAP 136 (H) 10/04/2017   GLUCAP 119 (H) 10/04/2017     Exercise Target Goals: Exercise Program Goal: Individual exercise prescription set using results from initial 6 min walk test and THRR while considering  patient's activity barriers and safety.   Exercise Prescription Goal: Initial exercise  prescription builds to 30-45 minutes a day of aerobic activity, 2-3 days per week.  Home exercise guidelines will be given to patient during program as part of exercise prescription that the participant will acknowledge.  Activity Barriers & Risk Stratification: Activity Barriers & Cardiac Risk Stratification - 12/12/17 0944    Activity Barriers & Cardiac Risk Stratification          Activity Barriers  Deconditioning;Muscular Weakness    Cardiac Risk Stratification  High           6 Minute Walk: 6 Minute Walk    6 Minute Walk    Row Name 12/12/17 0943   Phase  Initial   Distance  1491 feet   Walk Time  6 minutes   # of Rest Breaks  0   MPH  2.82   METS  2.91   RPE  6   VO2 Peak  10.17   Symptoms  No   Resting HR  55 bpm   Resting BP  104/72   Resting Oxygen Saturation   99 %   Exercise Oxygen Saturation   during 6 min walk  100 %   Max Ex. HR  78 bpm   Max Ex. BP  120/74   2 Minute Post BP  102/70          Oxygen Initial Assessment:   Oxygen Re-Evaluation:   Oxygen Discharge (Final Oxygen Re-Evaluation):   Initial Exercise Prescription: Initial Exercise Prescription - 12/12/17 1000    Date of Initial Exercise RX and Referring Provider          Date  12/13/18    Referring Provider  Dr. Marlou Porch    Expected Discharge Date  03/20/18        Treadmill          MPH  3    Grade  1    Minutes  10        Bike          Level  1    Minutes  10    METs  3.2        NuStep          Level  4    SPM  85    Minutes  10    METs  3        Prescription Details          Frequency (times per week)  3    Duration  Progress to 30 minutes of continuous aerobic without signs/symptoms of physical distress        Intensity          THRR 40-80% of Max Heartrate  59-118    Ratings of Perceived Exertion  11-13        Progression          Progression  Continue to progress workloads to maintain intensity without signs/symptoms of physical distress.        Resistance Training          Training Prescription  Yes    Weight  4 lbs.     Reps  10-15           Perform Capillary Blood Glucose checks as needed.  Exercise Prescription Changes: Exercise Prescription Changes    Response to Exercise    Row Name 12/18/17 0934 01/06/18 1000 01/28/18 1400 03/04/18 1000   Blood Pressure (Admit)  118/72  104/62  106/58  118/70   Blood Pressure (Exercise)  126/70  130/70  138/66  148/66   Blood Pressure (Exit)  108/60  104/62  104/60  108/60   Heart Rate (Admit)  84 bpm  67 bpm  67 bpm  80 bpm   Heart Rate (Exercise)  96 bpm  119 bpm  117 bpm  121 bpm   Heart Rate (Exit)  75 bpm  77 bpm  73 bpm  73 bpm   Rating of Perceived Exertion (Exercise)  8  11  12  11    Symptoms  no documentation  no documentation  no documentation  None   Comments  Pt first day of exercise.   Reviewed HEP   no documentation  no documentation   Duration  Continue with 30 min of aerobic exercise without signs/symptoms of physical distress.  Continue with 30 min of aerobic exercise without signs/symptoms of physical distress.  Continue with 30 min of aerobic exercise without signs/symptoms of physical distress.  Continue with 30 min of aerobic exercise without signs/symptoms of physical distress.   Intensity  THRR unchanged  THRR unchanged  THRR unchanged  THRR unchanged       Progression    Row Name 12/18/17 0934 01/06/18 1000 01/28/18 1400 03/04/18 1000   Progression  Continue to progress workloads to maintain intensity without signs/symptoms of physical distress.  Continue to progress workloads to maintain intensity without signs/symptoms of physical distress.  Continue to progress workloads to maintain intensity without signs/symptoms of physical distress.  Continue to progress workloads to maintain intensity without signs/symptoms of physical distress.   Average METs  3.4  5  5.3  5.3       Resistance Training    Row Name 12/18/17 0934 01/06/18 1000 01/28/18 1400 03/04/18 1000   Training Prescription  No  Yes  Yes  No   Weight  no documentation  4 lbs.   4 lbs.   no documentation   Reps  no documentation  10-15  10-15  no documentation   Time  no documentation  10 Minutes  10 Minutes  no documentation       Treadmill    Row Name 12/18/17 0934 01/06/18 1000 01/28/18 1400 03/04/18 1000   MPH  3  3.8  3.8  3.8   Grade  1  2  2  2    Minutes  10  10  10  10        Bike    Row Name 12/18/17 0934 01/06/18 1000 01/28/18 1400 03/04/18 1000   Level  1  2  1.8  1.8   Minutes  10  10  10  10    METs  3.27  5.13  5.08  5.13       NuStep    Row Name 12/18/17 0934 01/06/18 1000 01/28/18 1400 03/04/18 1000   Level  4  5  5  6    SPM  85  85  85  85   Minutes  10  10  10  10    METs  3.2  4.9  5.9  6       Home Exercise Plan    Row Name 12/18/17 0934 01/06/18 1000 01/28/18 1400 03/04/18 1000    Plans to continue exercise at  no documentation  Home (comment)  Home (comment)  Home (comment)   Frequency  no documentation  Add 3 additional days to program exercise sessions.  Add 3 additional days to program exercise sessions.  Add 3 additional days to program exercise sessions.  Initial Home Exercises Provided  no documentation  01/06/18  01/06/18  01/06/18          Exercise Comments: Exercise Comments    Row Name 12/18/17 6644 01/06/18 1035 01/07/18 1408 01/28/18 1407 03/05/18 1022   Exercise Comments  Pt first day of exercise. Tolerated exercise well and will continue exercising at home in addition to cardiac rehab.   Reviewed HEP with Pt. Pt was responsive.   Reviewed METs and goals with Pt. Pt will continue exercising at home in addition to Cardiac Rehab.   Reviewed METs and goals with Pt. Pt will continue exercising at home in addition to Cardiac Rehab.   Pt has been absent from the program for a week. MET level has not changed since last check. Will follow up with Pt.       Exercise Goals and Review: Exercise Goals    Exercise Goals    Row Name 12/12/17 0945   Increase Physical Activity  Yes   Intervention  Provide advice, education, support and counseling about physical activity/exercise needs.;Develop an individualized exercise prescription for aerobic and resistive training based on initial evaluation findings, risk stratification, comorbidities and participant's personal goals.   Expected Outcomes  Short Term: Attend rehab on a regular basis to increase amount of physical activity.   Increase Strength and Stamina  Yes   Intervention  Provide advice, education, support and counseling about physical activity/exercise needs.;Develop an individualized exercise prescription for aerobic and resistive training based on initial evaluation findings, risk stratification, comorbidities and participant's personal goals.   Expected Outcomes  Short Term: Increase workloads from initial  exercise prescription for resistance, speed, and METs.   Able to understand and use rate of perceived exertion (RPE) scale  Yes   Intervention  Provide education and explanation on how to use RPE scale   Expected Outcomes  Short Term: Able to use RPE daily in rehab to express subjective intensity level;Long Term:  Able to use RPE to guide intensity level when exercising independently   Knowledge and understanding of Target Heart Rate Range (THRR)  Yes   Intervention  Provide education and explanation of THRR including how the numbers were predicted and where they are located for reference   Expected Outcomes  Short Term: Able to state/look up THRR;Long Term: Able to use THRR to govern intensity when exercising independently;Short Term: Able to use daily as guideline for intensity in rehab   Able to check pulse independently  Yes   Intervention  Provide education and demonstration on how to check pulse in carotid and radial arteries.;Review the importance of being able to check your own pulse for safety during independent exercise   Expected Outcomes  Short Term: Able to explain why pulse checking is important during independent exercise;Long Term: Able to check pulse independently and accurately   Understanding of Exercise Prescription  Yes   Intervention  Provide education, explanation, and written materials on patient's individual exercise prescription   Expected Outcomes  Short Term: Able to explain program exercise prescription;Long Term: Able to explain home exercise prescription to exercise independently          Exercise Goals Re-Evaluation : Exercise Goals Re-Evaluation    Exercise Goal Re-Evaluation    Row Name 12/18/17 0936 01/06/18 1032 01/07/18 1406 01/28/18 1406 03/05/18 1021   Exercise Goals Review  Increase Physical Activity;Increase Strength and Stamina;Able to check pulse independently;Understanding of Exercise Prescription;Able to understand and use rate of perceived exertion  (RPE) scale;Knowledge and understanding of Target  Heart Rate Range (THRR)  Increase Physical Activity;Increase Strength and Stamina;Able to understand and use rate of perceived exertion (RPE) scale;Knowledge and understanding of Target Heart Rate Range (THRR);Understanding of Exercise Prescription;Able to check pulse independently  Increase Physical Activity;Increase Strength and Stamina;Able to understand and use rate of perceived exertion (RPE) scale;Knowledge and understanding of Target Heart Rate Range (THRR);Understanding of Exercise Prescription;Able to check pulse independently  Increase Physical Activity;Increase Strength and Stamina;Able to understand and use rate of perceived exertion (RPE) scale;Knowledge and understanding of Target Heart Rate Range (THRR);Understanding of Exercise Prescription;Able to check pulse independently  Increase Physical Activity;Increase Strength and Stamina;Able to understand and use rate of perceived exertion (RPE) scale;Knowledge and understanding of Target Heart Rate Range (THRR);Understanding of Exercise Prescription;Able to check pulse independently   Comments  Pt first day of exercise. Pt tolerated exercise well and had an average MET level of 3.4. 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   Reviewed HEP with Pt. Pt was responsive and understand THRR, MET level, exercise precautions, weather precautions, end points of exercise, warm up and cool down. Pt plans to continue exercising at home in addition to Cardiac Rehab.   Reviewed METs and goals with Pt. Pt is progressing well and is increasing workloads gradually. 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   Reviewed METs and goals with Pt. MET level is 5.3 and progressing. Pt is progressing well and is increasing workloads gradually. 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   Pt has been absent from the  prgram for a week. MET level has not changed since last check but Pt is progressing well at current workloads. Pt continues exercising at home in addition to cardiac rehab.    Expected Outcomes  Will continue to monitor and progress Pt as tolerated.   Will continue to monitor and progress Pt as tolerated.   Will continue to monitor and progress Pt as tolerated.   Will continue to monitor and progress Pt as tolerated.   Will continue to monitor and progress Pt as tolerated.           Discharge Exercise Prescription (Final Exercise Prescription Changes): Exercise Prescription Changes - 03/04/18 1000    Response to Exercise          Blood Pressure (Admit)  118/70    Blood Pressure (Exercise)  148/66    Blood Pressure (Exit)  108/60    Heart Rate (Admit)  80 bpm    Heart Rate (Exercise)  121 bpm    Heart Rate (Exit)  73 bpm    Rating of Perceived Exertion (Exercise)  11    Symptoms  None    Duration  Continue with 30 min of aerobic exercise without signs/symptoms of physical distress.    Intensity  THRR unchanged        Progression          Progression  Continue to progress workloads to maintain intensity without signs/symptoms of physical distress.    Average METs  5.3        Resistance Training          Training Prescription  No        Treadmill          MPH  3.8    Grade  2    Minutes  10  Bike          Level  1.8    Minutes  10    METs  5.13        NuStep          Level  6    SPM  85    Minutes  10    METs  6        Home Exercise Plan          Plans to continue exercise at  Home (comment)    Frequency  Add 3 additional days to program exercise sessions.    Initial Home Exercises Provided  01/06/18           Nutrition:  Target Goals: Understanding of nutrition guidelines, daily intake of sodium <1523m, cholesterol <2078m calories 30% from fat and 7% or less from saturated fats, daily to have 5 or more servings of fruits and  vegetables.  Biometrics: Pre Biometrics - 12/12/17 0945    Pre Biometrics          Height  5' 10"  (1.778 m)    Weight  83.6 kg    Waist Circumference  39 inches    Hip Circumference  40 inches    Waist to Hip Ratio  0.98 %    BMI (Calculated)  26.44    Triceps Skinfold  22 mm    % Body Fat  28.2 %    Grip Strength  25 kg    Flexibility  10 in    Single Leg Stand  30 seconds            Nutrition Therapy Plan and Nutrition Goals: Nutrition Therapy & Goals - 12/12/17 1025    Nutrition Therapy          Diet  heart healthy        Personal Nutrition Goals          Nutrition Goal  Pt to identify and limit food sources of saturated fat, trans fat, refined carbohydrates and sodium    Personal Goal #2  Pt able to name foods that affect blood glucose        Intervention Plan          Intervention  Prescribe, educate and counsel regarding individualized specific dietary modifications aiming towards targeted core components such as weight, hypertension, lipid management, diabetes, heart failure and other comorbidities.    Expected Outcomes  Short Term Goal: Understand basic principles of dietary content, such as calories, fat, sodium, cholesterol and nutrients.;Long Term Goal: Adherence to prescribed nutrition plan.           Nutrition Assessments: Nutrition Assessments - 12/12/17 1027    MEDFICTS Scores          Pre Score  41           Nutrition Goals Re-Evaluation:   Nutrition Goals Re-Evaluation:   Nutrition Goals Discharge (Final Nutrition Goals Re-Evaluation):   Psychosocial: Target Goals: Acknowledge presence or absence of significant depression and/or stress, maximize coping skills, provide positive support system. Participant is able to verbalize types and ability to use techniques and skills needed for reducing stress and depression.  Initial Review & Psychosocial Screening: Initial Psych Review & Screening - 12/12/17 1204    Initial Review           Current issues with  None Identified        Family Dynamics          Good Support System?  Yes  Barriers          Psychosocial barriers to participate in program  There are no identifiable barriers or psychosocial needs.        Screening Interventions          Interventions  Encouraged to exercise           Quality of Life Scores: Quality of Life - 12/12/17 0918    Quality of Life          Select  Quality of Life        Quality of Life Scores          Health/Function Pre  27.2 %    Socioeconomic Pre  28.93 %    Psych/Spiritual Pre  29.14 %    Family Pre  28.8 %    GLOBAL Pre  28.19 %          Scores of 19 and below usually indicate a poorer quality of life in these areas.  A difference of  2-3 points is a clinically meaningful difference.  A difference of 2-3 points in the total score of the Quality of Life Index has been associated with significant improvement in overall quality of life, self-image, physical symptoms, and general health in studies assessing change in quality of life.  PHQ-9: Recent Review Flowsheet Data    Depression screen [facility] 2/9 12/18/2017   Decreased Interest 0   Down, Depressed, Hopeless 0   PHQ - 2 Score 0     Interpretation of Total Score  Total Score Depression Severity:  1-4 = Minimal depression, 5-9 = Mild depression, 10-14 = Moderate depression, 15-19 = Moderately severe depression, 20-27 = Severe depression   Psychosocial Evaluation and Intervention: Psychosocial Evaluation - 12/18/17 0934    Psychosocial Evaluation & Interventions          Interventions  Encouraged to exercise with the program and follow exercise prescription    Comments  no psychosocial needs identified, no interventions necessary.  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      Expected Outcomes  pt will exhibit positive outlook with good coping skills    Continue Psychosocial Services   No Follow up required           Psychosocial  Re-Evaluation: Psychosocial Re-Evaluation    Psychosocial Re-Evaluation    [place] Name 01/03/18 0734 02/03/18 1112 02/28/18 0733 03/06/18 1050   Current issues with  None Identified  None Identified  None Identified  None Identified   Comments  no psychosocial needs identified, no interventions necessary   no psychosocial needs identified, no interventions necessary   no psychosocial needs identified, no interventions necessary   no psychosocial needs identified, no interventions necessary    Expected Outcomes  pt will exhibit positive outlook with good coping skills  pt will exhibit positive outlook with good coping skills  pt will exhibit positive outlook with good coping skills  pt will exhibit positive outlook with good coping skills   Interventions  Encouraged to attend Cardiac Rehabilitation for the exercise  Encouraged to attend Cardiac Rehabilitation for the exercise  Encouraged to attend Cardiac Rehabilitation for the exercise  Encouraged to attend Cardiac Rehabilitation for the exercise   Continue Psychosocial Services   No Follow up required  No Follow up required  No Follow up required  No Follow up required          Psychosocial Discharge (Final Psychosocial Re-Evaluation): Psychosocial Re-Evaluation - 03/06/18 1050  Psychosocial Re-Evaluation          Current issues with  None Identified    Comments  no psychosocial needs identified, no interventions necessary     Expected Outcomes  pt will exhibit positive outlook with good coping skills    Interventions  Encouraged to attend Cardiac Rehabilitation for the exercise    Continue Psychosocial Services   No Follow up required           Vocational Rehabilitation: Provide vocational rehab assistance to qualifying candidates.   Vocational Rehab Evaluation & Intervention: Vocational Rehab - 12/12/17 0919    Initial Vocational Rehab Evaluation & Intervention          Assessment shows need for Vocational Rehabilitation  No            Education: Education Goals: Education classes will be provided on a weekly basis, covering required topics. Participant will state understanding/return demonstration of topics presented.  Learning Barriers/Preferences: Learning Barriers/Preferences - 12/12/17 0946    Learning Barriers/Preferences          Learning Barriers  Sight;Hearing    Learning Preferences  Pictoral;Written Material           Education Topics: Count Your Pulse:  -Group instruction provided by verbal instruction, demonstration, patient participation and written materials to support subject.  Instructors address importance of being able to find your pulse and how to count your pulse when at home without a heart monitor.  Patients get hands on experience counting their pulse with staff help and individually. Flowsheet Row CARDIAC REHAB PHASE II EXERCISE from 02/21/2018 in [place] [facility]  Date  02/21/18  Instruction Review Code  2- Demonstrated Understanding      Heart Attack, Angina, and Risk Factor Modification:  -Group instruction provided by verbal instruction, video, and written materials to support subject.  Instructors address signs and symptoms of angina and heart attacks.    Also discuss risk factors for heart disease and how to make changes to improve heart health risk factors. Flowsheet Row CARDIAC REHAB PHASE II EXERCISE from 02/21/2018 in [place]  Date  01/08/18  Educator  RN  Instruction Review Code  2- Demonstrated Understanding      Functional Fitness:  -Group instruction provided by verbal instruction, demonstration, patient participation, and written materials to support subject.  Instructors address safety measures for doing things around the house.  Discuss how to get up and down off the floor, how to pick things up properly, how to safely get out of a chair without assistance, and balance training. Flowsheet Row CARDIAC  REHAB PHASE II EXERCISE from 02/21/2018 in [place]  Date  01/10/18  Educator  EP  Instruction Review Code  2- Demonstrated Understanding      Meditation and Mindfulness:  -Group instruction provided by verbal instruction, patient participation, and written materials to support subject.  Instructor addresses importance of mindfulness and meditation practice to help reduce stress and improve awareness.  Instructor also leads participants through a meditation exercise.    Stretching for Flexibility and Mobility:  -Group instruction provided by verbal instruction, patient participation, and written materials to support subject.  Instructors lead participants through series of stretches that are designed to increase flexibility thus improving mobility.  These stretches are additional exercise for major muscle groups that are typically performed during regular warm up and cool down.   Hands Only CPR:  -Group verbal, video, and participation provides  a basic overview of AHA guidelines for community CPR. Role-play of emergencies allow participants the opportunity to practice calling for help and chest compression technique with discussion of AED use.   Hypertension: -Group verbal and written instruction that provides a basic overview of hypertension including the most recent diagnostic guidelines, risk factor reduction with self-care instructions and medication management. Flowsheet Row CARDIAC REHAB PHASE II EXERCISE from 02/21/2018 in [place]  Date  12/18/17  Educator  RN  Instruction Review Code  2- Demonstrated Understanding       Nutrition I class: Heart Healthy Eating:  -Group instruction provided by PowerPoint slides, verbal discussion, and written materials to support subject matter. The instructor gives an explanation and review of the Therapeutic Lifestyle Changes diet recommendations, which includes a discussion on  lipid goals, dietary fat, sodium, fiber, plant stanol/sterol esters, sugar, and the components of a well-balanced, healthy diet.   Nutrition II class: Lifestyle Skills:  -Group instruction provided by PowerPoint slides, verbal discussion, and written materials to support subject matter. The instructor gives an explanation and review of label reading, grocery shopping for heart health, heart healthy recipe modifications, and ways to make healthier choices when eating out.   Diabetes Question & Answer:  -Group instruction provided by PowerPoint slides, verbal discussion, and written materials to support subject matter. The instructor gives an explanation and review of diabetes co-morbidities, pre- and post-prandial blood glucose goals, pre-exercise blood glucose goals, signs, symptoms, and treatment of hypoglycemia and hyperglycemia, and foot care basics.   Diabetes Blitz:  -Group instruction provided by PowerPoint slides, verbal discussion, and written materials to support subject matter. The instructor gives an explanation and review of the physiology behind type 1 and type 2 diabetes, diabetes medications and rational behind using different medications, pre- and post-prandial blood glucose recommendations and Hemoglobin A1c goals, diabetes diet, and exercise including blood glucose guidelines for exercising safely.    Portion Distortion:  -Group instruction provided by PowerPoint slides, verbal discussion, written materials, and food models to support subject matter. The instructor gives an explanation of serving size versus portion size, changes in portions sizes over the last 20 years, and what consists of a serving from each food group.   Stress Management:  -Group instruction provided by verbal instruction, video, and written materials to support subject matter.  Instructors review role of stress in heart disease and how to cope with stress positively.     Exercising on Your Own:  -Group  instruction provided by verbal instruction, power point, and written materials to support subject.  Instructors discuss benefits of exercise, components of exercise, frequency and intensity of exercise, and end points for exercise.  Also discuss use of nitroglycerin and activating EMS.  Review options of places to exercise outside of rehab.  Review guidelines for sex with heart disease.   Cardiac Drugs I:  -Group instruction provided by verbal instruction and written materials to support subject.  Instructor reviews cardiac drug classes: antiplatelets, anticoagulants, beta blockers, and statins.  Instructor discusses reasons, side effects, and lifestyle considerations for each drug class.   Cardiac Drugs II:  -Group instruction provided by verbal instruction and written materials to support subject.  Instructor reviews cardiac drug classes: angiotensin converting enzyme inhibitors (ACE-I), angiotensin II receptor blockers (ARBs), nitrates, and calcium channel blockers.  Instructor discusses reasons, side effects, and lifestyle considerations for each drug class. Flowsheet Row CARDIAC REHAB PHASE II EXERCISE from 02/21/2018 in [place]  Date  01/01/18  Educator  Pharmacist   Instruction Review Code  2- Demonstrated Understanding      Anatomy and Physiology of the Circulatory System:  Group verbal and written instruction and models provide basic cardiac anatomy and physiology, with the coronary electrical and arterial systems. Review of: AMI, Angina, Valve disease, Heart Failure, Peripheral Artery Disease, Cardiac Arrhythmia, Pacemakers, and the ICD. Flowsheet Row CARDIAC REHAB PHASE II EXERCISE from 02/21/2018 in [place]  Date  12/25/17  Educator  RN  Instruction Review Code  2- Demonstrated Understanding      Other Education:  -Group or individual verbal, written, or video instructions that support the educational goals of  the cardiac rehab program.   Holiday Eating Survival Tips:  -Group instruction provided by PowerPoint slides, verbal discussion, and written materials to support subject matter. The instructor gives patients tips, tricks, and techniques to help them not only survive but enjoy the holidays despite the onslaught of food that accompanies the holidays.   Knowledge Questionnaire Score: Knowledge Questionnaire Score - 12/12/17 0919    Knowledge Questionnaire Score          Pre Score  22/24           Core Components/Risk Factors/Patient Goals at Admission: Personal Goals and Risk Factors at Admission - 12/12/17 0947    Core Components/Risk Factors/Patient Goals on Admission           Weight Management  Yes;Weight Loss;Weight Maintenance    Intervention  Weight Management: Develop a combined nutrition and exercise program designed to reach desired caloric intake, while maintaining appropriate intake of nutrient and fiber, sodium and fats, and appropriate energy expenditure required for the weight goal.;Weight Management: Provide education and appropriate resources to help participant work on and attain dietary goals.;Weight Management/Obesity: Establish reasonable short term and long term weight goals.    Admit Weight  184 lb 4.9 oz (83.6 kg)    Expected Outcomes  Short Term: Continue to assess and modify interventions until short term weight is achieved;Long Term: Adherence to nutrition and physical activity/exercise program aimed toward attainment of established weight goal;Weight Maintenance: Understanding of the daily nutrition guidelines, which includes 25-35% calories from fat, 7% or less cal from saturated fats, less than 287m cholesterol, less than 1.5gm of sodium, & 5 or more servings of fruits and vegetables daily;Weight Loss: Understanding of general recommendations for a balanced deficit meal plan, which promotes 1-2 lb weight loss per week and includes a negative energy balance of  [phone] kcal/d;Understanding recommendations for meals to include 15-35% energy as protein, 25-35% energy from fat, 35-60% energy from carbohydrates, less than 2074mof dietary cholesterol, 20-35 gm of total fiber daily;Understanding of distribution of calorie intake throughout the day with the consumption of 4-5 meals/snacks    Hypertension  Yes    Intervention  Provide education on lifestyle modifcations including regular physical activity/exercise, weight management, moderate sodium restriction and increased consumption of fresh fruit, vegetables, and low fat dairy, alcohol moderation, and smoking cessation.;Monitor prescription use compliance.    Expected Outcomes  Short Term: Continued assessment and intervention until BP is < 140/9061mG in hypertensive participants. < 130/46m41m in hypertensive participants with diabetes, heart failure or chronic kidney disease.;Long Term: Maintenance of blood pressure at goal levels.    Lipids  Yes    Intervention  Provide education and support for participant on nutrition & aerobic/resistive exercise along with prescribed medications to achieve LDL <70mg35mL >40mg.29mExpected Outcomes  Short Term: Participant states understanding of desired cholesterol values and is compliant with medications prescribed. Participant is following exercise prescription and nutrition guidelines.;Long Term: Cholesterol controlled with medications as prescribed, with individualized exercise RX and with personalized nutrition plan. Value goals: LDL < 7m, HDL > 40 mg.    Stress  Yes    Intervention  Offer individual and/or small group education and counseling on adjustment to heart disease, stress management and health-related lifestyle change. Teach and support self-help strategies.;Refer participants experiencing significant psychosocial distress to appropriate mental health specialists for further evaluation and treatment. When possible, include family members and significant  others in education/counseling sessions.    Expected Outcomes  Short Term: Participant demonstrates changes in health-related behavior, relaxation and other stress management skills, ability to obtain effective social support, and compliance with psychotropic medications if prescribed.;Long Term: Emotional wellbeing is indicated by absence of clinically significant psychosocial distress or social isolation.           Core Components/Risk Factors/Patient Goals Review:  Goals and Risk Factor Review    Core Components/Risk Factors/Patient Goals Review    Row Name 12/18/17 0936 01/03/18 0734 02/03/18 1112 02/28/18 0733   Personal Goals Review  Weight Management/Obesity;Hypertension;Lipids;Stress  Weight Management/Obesity;Hypertension;Lipids;Stress  Weight Management/Obesity;Hypertension;Lipids;Stress  Weight Management/Obesity;Hypertension;Lipids;Stress   Review  pt with CAD RF demonstrates eagerness to participate in CR program.  pt personal goals are to lose 10lb and resume previous activities.   pt with CAD RF demonstrates eagerness to participate in CR program.  pt personal goals are to lose 10lb and resume previous activities. pt is eager to be able to walk further with less DOE.  pt desires to walk uphill and downhill without difficulty.   pt with CAD RF demonstrates eagerness to participate in CR program.  pt personal goals are to lose 10lb and resume previous activities. pt is eager to be able to walk further with less DOE.  pt desires to walk uphill and downhill without difficulty. pt is currently walking 30 minutes using Treadmill at home.  pt continues striving for weight loss goal.   pt with CAD RF demonstrates eagerness to participate in CR program.  pt personal goals are to lose 10lb and resume previous activities. pt is eager to be able to walk further with less DOE.  pt desires to walk uphill and downhill without difficulty. pt is currently walking 30 minutes using Treadmill at home.  pt  continues striving for weight loss goal. pt it very pleased to have been able to ride his horse with MD clearance.     Expected Outcomes  pt will participate in CR exercise, nutrition and lifestyle modificaiton for overall RF reducation.   pt will participate in CR exercise, nutrition and lifestyle modificaiton for overall RF reducation.   pt will participate in CR exercise, nutrition and lifestyle modificaiton for overall RF reducation.   pt will participate in CR exercise, nutrition and lifestyle modificaiton for overall RF reducation.           Core Components/Risk Factors/Patient Goals at Discharge (Final Review):  Goals and Risk Factor Review - 02/28/18 0733    Core Components/Risk Factors/Patient Goals Review          Personal Goals Review  Weight Management/Obesity;Hypertension;Lipids;Stress    Review  pt with CAD RF demonstrates eagerness to participate in CR program.  pt personal goals are to lose 10lb and resume previous activities. pt is eager to be able to walk further with less DOE.  pt desires to  walk uphill and downhill without difficulty. pt is currently walking 30 minutes using Treadmill at home.  pt continues striving for weight loss goal. pt it very pleased to have been able to ride his horse with MD clearance.      Expected Outcomes  pt will participate in CR exercise, nutrition and lifestyle modificaiton for overall RF reducation.            ITP Comments: ITP Comments    Row Name 12/12/17 [phone] 12/18/17 0941 01/03/18 0731 02/05/18 1506 02/28/18 0732   ITP Comments  Dr. Fransico Him Medical Director  pt started group exercise. pt tolerated light activity without difficulty. pt oriented to exercise equipment and safety routine.   30 day ITP review.  pt with good attendance and participation.   30 day ITP review.  pt with good attendance and participation.   30 day ITP review.  pt with good attendance and participation.    [place] Name 03/06/18 1050   ITP Comments  30 day ITP  review.  pt with good attendance and participation. recent absences from flu virus.       Comments:

## 2018-03-07 ENCOUNTER — Encounter (HOSPITAL_COMMUNITY): Payer: Medicare Other

## 2018-03-07 ENCOUNTER — Encounter (HOSPITAL_COMMUNITY)
Admission: RE | Admit: 2018-03-07 | Discharge: 2018-03-07 | Disposition: A | Discharge status: Home / Self Care | Payer: Medicare Other | Source: Ambulatory Visit | Attending: Cardiology | Admitting: Cardiology

## 2018-03-07 DIAGNOSIS — Z951 Presence of aortocoronary bypass graft: Secondary | ICD-10-CM | POA: Insufficient documentation

## 2018-03-10 ENCOUNTER — Encounter (HOSPITAL_COMMUNITY)
Admission: RE | Admit: 2018-03-10 | Discharge: 2018-03-10 | Disposition: A | Discharge status: Home / Self Care | Payer: Medicare Other | Source: Ambulatory Visit | Attending: Cardiology | Admitting: Cardiology

## 2018-03-10 ENCOUNTER — Encounter (HOSPITAL_COMMUNITY): Payer: Medicare Other

## 2018-03-10 DIAGNOSIS — Z951 Presence of aortocoronary bypass graft: Secondary | ICD-10-CM

## 2018-03-12 ENCOUNTER — Encounter (HOSPITAL_COMMUNITY): Payer: Medicare Other

## 2018-03-12 ENCOUNTER — Encounter (HOSPITAL_COMMUNITY)
Admission: RE | Admit: 2018-03-12 | Discharge: 2018-03-12 | Disposition: A | Discharge status: Home / Self Care | Payer: Medicare Other | Source: Ambulatory Visit | Attending: Cardiology | Admitting: Cardiology

## 2018-03-12 VITALS — Ht 70.0 in | Wt 183.6 lb

## 2018-03-12 DIAGNOSIS — Z951 Presence of aortocoronary bypass graft: Secondary | ICD-10-CM

## 2018-03-14 ENCOUNTER — Encounter (HOSPITAL_COMMUNITY)
Admission: RE | Admit: 2018-03-14 | Discharge: 2018-03-14 | Disposition: A | Discharge status: Home / Self Care | Payer: Medicare Other | Source: Ambulatory Visit | Attending: Cardiology | Admitting: Cardiology

## 2018-03-14 ENCOUNTER — Encounter (HOSPITAL_COMMUNITY): Payer: Medicare Other

## 2018-03-14 DIAGNOSIS — Z951 Presence of aortocoronary bypass graft: Secondary | ICD-10-CM | POA: Diagnosis not present

## 2018-03-17 ENCOUNTER — Encounter (HOSPITAL_COMMUNITY): Payer: Medicare Other

## 2018-03-17 ENCOUNTER — Encounter (HOSPITAL_COMMUNITY)
Admission: RE | Admit: 2018-03-17 | Discharge: 2018-03-17 | Disposition: A | Discharge status: Home / Self Care | Payer: Medicare Other | Source: Ambulatory Visit | Attending: Cardiology | Admitting: Cardiology

## 2018-03-17 DIAGNOSIS — Z951 Presence of aortocoronary bypass graft: Secondary | ICD-10-CM

## 2018-03-19 ENCOUNTER — Encounter (HOSPITAL_COMMUNITY): Payer: Medicare Other

## 2018-03-19 ENCOUNTER — Encounter (HOSPITAL_COMMUNITY): Payer: Self-pay

## 2018-03-19 ENCOUNTER — Encounter (HOSPITAL_COMMUNITY)
Admission: RE | Admit: 2018-03-19 | Discharge: 2018-03-19 | Disposition: A | Discharge status: Home / Self Care | Payer: Medicare Other | Source: Ambulatory Visit | Attending: Cardiology | Admitting: Cardiology

## 2018-03-19 DIAGNOSIS — Z951 Presence of aortocoronary bypass graft: Secondary | ICD-10-CM | POA: Diagnosis not present

## 2018-03-21 ENCOUNTER — Encounter (HOSPITAL_COMMUNITY): Payer: Medicare Other

## 2018-04-17 NOTE — Addendum Note (Signed)
Encounter addended by: Ivonne Andrew, RD on: 04/17/2018 1:48 PM  Actions taken: Flowsheet data copied forward, Visit Navigator Flowsheet section accepted

## 2018-04-18 NOTE — Addendum Note (Signed)
Encounter addended by: Lowell Guitar, RN on: 04/18/2018 8:12 AM  Actions taken: Order Reconciliation Section accessed, Flowsheet data copied forward, Visit Navigator Flowsheet section accepted, Clinical Note Signed

## 2018-04-18 NOTE — Progress Notes (Signed)
Discharge Progress Report  Patient Details  Name: Jeff Decker MRN: [id number] Date of Birth: Oct 27, 1945 Referring Provider:   Flowsheet Row CARDIAC REHAB PHASE II ORIENTATION from 12/12/2017 in [facility]  Referring Provider  Dr. Marlou Porch       Number of Visits: 29   Reason for Discharge:  Patient has met program and personal goals.  Smoking History:  Social History   Tobacco Use  Smoking Status Former Smoker  . Last attempt to quit: 08/01/1986  . Years since quitting: 31.7  Smokeless Tobacco Never Used    Diagnosis:  S/P CABG x 4  ADL [facility]:   Initial Exercise Prescription: Initial Exercise Prescription - 12/12/17 1000    Date of Initial Exercise RX and Referring Provider          Date  12/13/18    Referring Provider  Dr. Marlou Porch    Expected Discharge Date  03/20/18        Treadmill          MPH  3    Grade  1    Minutes  10        Bike          Level  1    Minutes  10    METs  3.2        NuStep          Level  4    SPM  85    Minutes  10    METs  3        Prescription Details          Frequency (times per week)  3    Duration  Progress to 30 minutes of continuous aerobic without signs/symptoms of physical distress        Intensity          THRR 40-80% of Max Heartrate  59-118    Ratings of Perceived Exertion  11-13        Progression          Progression  Continue to progress workloads to maintain intensity without signs/symptoms of physical distress.        Resistance Training          Training Prescription  Yes    Weight  4 lbs.     Reps  10-15           Discharge Exercise Prescription (Final Exercise Prescription Changes): Exercise Prescription Changes - 03/25/18 1500    Response to Exercise          Blood Pressure (Admit)  134/58    Blood Pressure (Exercise)  148/66    Blood Pressure (Exit)  104/60    Heart Rate (Admit)  64 bpm    Heart Rate (Exercise)  112 bpm    Heart Rate  (Exit)  68 bpm    Rating of Perceived Exertion (Exercise)  12    Symptoms  None    Duration  Continue with 30 min of aerobic exercise without signs/symptoms of physical distress.    Intensity  THRR unchanged        Progression          Progression  Continue to progress workloads to maintain intensity without signs/symptoms of physical distress.    Average METs  5.3        Resistance Training          Training Prescription  No        Treadmill  MPH  3.8    Grade  2    Minutes  10        Bike          Level  1.8    Minutes  10    METs  5.13        NuStep          Level  6    SPM  85    Minutes  10    METs  6        Home Exercise Plan          Plans to continue exercise at  Home (comment)    Frequency  Add 3 additional days to program exercise sessions.    Initial Home Exercises Provided  01/06/18           Functional Capacity: 6 Minute Walk    6 Minute Walk    Row Name 12/12/17 0943 03/12/18 1054 03/19/18 0942   Phase  Initial  Discharge  Discharge   Distance  1491 feet  2347 feet  no documentation   Distance % Change  no documentation  57.41 %  no documentation   Distance Feet Change  no documentation  856 ft  no documentation   Walk Time  6 minutes  6 minutes  no documentation   # of Rest Breaks  0  0  no documentation   MPH  2.82  4.4  no documentation   METS  2.91  4.79  no documentation   RPE  6  11  no documentation   VO2 Peak  10.17  16.77  no documentation   Symptoms  No  No  no documentation   Resting HR  55 bpm  60 bpm  no documentation   Resting BP  104/72  106/62  no documentation   Resting Oxygen Saturation   99 %  no documentation  no documentation   Exercise Oxygen Saturation  during 6 min walk  100 %  no documentation  no documentation   Max Ex. HR  78 bpm  114 bpm  no documentation   Max Ex. BP  120/74  128/64  no documentation   2 Minute Post BP  102/70  96/58  no documentation          Psychological, QOL, Others -  Outcomes: PHQ 2/9: Depression screen PHQ 2/9 12/18/2017  Decreased Interest 0  Down, Depressed, Hopeless 0  PHQ - 2 Score 0    Quality of Life: Quality of Life - 03/19/18 0939    Quality of Life          Select  Quality of Life        Quality of Life Scores          Health/Function Post  27.03 %    Socioeconomic Post  27.86 %    Psych/Spiritual Post  30 %    Family Post  30 %    GLOBAL Post  28.25 %           Personal Goals: Goals established at orientation with interventions provided to work toward goal. Personal Goals and Risk Factors at Admission - 12/12/17 0947    Core Components/Risk Factors/Patient Goals on Admission           Weight Management  Yes;Weight Loss;Weight Maintenance    Intervention  Weight Management: Develop a combined nutrition and exercise program designed to reach desired caloric intake, while maintaining appropriate intake of nutrient and  fiber, sodium and fats, and appropriate energy expenditure required for the weight goal.;Weight Management: Provide education and appropriate resources to help participant work on and attain dietary goals.;Weight Management/Obesity: Establish reasonable short term and long term weight goals.    Admit Weight  184 lb 4.9 oz (83.6 kg)    Expected Outcomes  Short Term: Continue to assess and modify interventions until short term weight is achieved;Long Term: Adherence to nutrition and physical activity/exercise program aimed toward attainment of established weight goal;Weight Maintenance: Understanding of the daily nutrition guidelines, which includes 25-35% calories from fat, 7% or less cal from saturated fats, less than 278m cholesterol, less than 1.5gm of sodium, & 5 or more servings of fruits and vegetables daily;Weight Loss: Understanding of general recommendations for a balanced deficit meal plan, which promotes 1-2 lb weight loss per week and includes a negative energy balance of [phone] kcal/d;Understanding  recommendations for meals to include 15-35% energy as protein, 25-35% energy from fat, 35-60% energy from carbohydrates, less than 2060mof dietary cholesterol, 20-35 gm of total fiber daily;Understanding of distribution of calorie intake throughout the day with the consumption of 4-5 meals/snacks    Hypertension  Yes    Intervention  Provide education on lifestyle modifcations including regular physical activity/exercise, weight management, moderate sodium restriction and increased consumption of fresh fruit, vegetables, and low fat dairy, alcohol moderation, and smoking cessation.;Monitor prescription use compliance.    Expected Outcomes  Short Term: Continued assessment and intervention until BP is < 140/9075mG in hypertensive participants. < 130/54m83m in hypertensive participants with diabetes, heart failure or chronic kidney disease.;Long Term: Maintenance of blood pressure at goal levels.    Lipids  Yes    Intervention  Provide education and support for participant on nutrition & aerobic/resistive exercise along with prescribed medications to achieve LDL <70mg73mL >40mg.68mExpected Outcomes  Short Term: Participant states understanding of desired cholesterol values and is compliant with medications prescribed. Participant is following exercise prescription and nutrition guidelines.;Long Term: Cholesterol controlled with medications as prescribed, with individualized exercise RX and with personalized nutrition plan. Value goals: LDL < 70mg, 32m> 40 mg.    Stress  Yes    Intervention  Offer individual and/or small group education and counseling on adjustment to heart disease, stress management and health-related lifestyle change. Teach and support self-help strategies.;Refer participants experiencing significant psychosocial distress to appropriate mental health specialists for further evaluation and treatment. When possible, include family members and significant others in education/counseling  sessions.    Expected Outcomes  Short Term: Participant demonstrates changes in health-related behavior, relaxation and other stress management skills, ability to obtain effective social support, and compliance with psychotropic medications if prescribed.;Long Term: Emotional wellbeing is indicated by absence of clinically significant psychosocial distress or social isolation.            Personal Goals Discharge: Goals and Risk Factor Review    Core Components/Risk Factors/Patient Goals Review    Row Name 12/18/17 0936 01/03/18 0734 02/03/18 1112 02/28/18 0733 03/19/18 1135   Personal Goals Review  Weight Management/Obesity;Hypertension;Lipids;Stress  Weight Management/Obesity;Hypertension;Lipids;Stress  Weight Management/Obesity;Hypertension;Lipids;Stress  Weight Management/Obesity;Hypertension;Lipids;Stress  Weight Management/Obesity;Hypertension;Lipids;Stress   Review  pt with CAD RF demonstrates eagerness to participate in CR program.  pt personal goals are to lose 10lb and resume previous activities.   pt with CAD RF demonstrates eagerness to participate in CR program.  pt personal goals are to lose 10lb and resume previous activities. pt is eager to be able to  walk further with less DOE.  pt desires to walk uphill and downhill without difficulty.   pt with CAD RF demonstrates eagerness to participate in CR program.  pt personal goals are to lose 10lb and resume previous activities. pt is eager to be able to walk further with less DOE.  pt desires to walk uphill and downhill without difficulty. pt is currently walking 30 minutes using Treadmill at home.  pt continues striving for weight loss goal.   pt with CAD RF demonstrates eagerness to participate in CR program.  pt personal goals are to lose 10lb and resume previous activities. pt is eager to be able to walk further with less DOE.  pt desires to walk uphill and downhill without difficulty. pt is currently walking 30 minutes using Treadmill at  home.  pt continues striving for weight loss goal. pt it very pleased to have been able to ride his horse with MD clearance.    pt with CAD RF demonstrates eagerness to participate in CR program.  pt personal goals are to lose 10lb and resume previous activities. pt is eager to be able to walk further with less DOE.  pt desires to walk uphill and downhill without difficulty. pt is currently walking 30 minutes using Treadmill at home.  pt continues striving for weight loss goal. pt is most proud to be able to walk further, ride his horse, pick up saddle,  increased strength and progressively decreased  lifting restrictions   Expected Outcomes  pt will participate in CR exercise, nutrition and lifestyle modificaiton for overall RF reducation.   pt will participate in CR exercise, nutrition and lifestyle modificaiton for overall RF reducation.   pt will participate in CR exercise, nutrition and lifestyle modificaiton for overall RF reducation.   pt will participate in CR exercise, nutrition and lifestyle modificaiton for overall RF reducation.   pt will participate in exercise, nutrition and lifestyle modificaiton for overall RF reducation on his own in the community.       Core Components/Risk Factors/Patient Goals Review    Row Name 04/18/18 [phone]   Personal Goals Review  no documentation   Review  no documentation   Expected Outcomes  no documentation          Exercise Goals and Review: Exercise Goals    Exercise Goals    Row Name 12/12/17 0945   Increase Physical Activity  Yes   Intervention  Provide advice, education, support and counseling about physical activity/exercise needs.;Develop an individualized exercise prescription for aerobic and resistive training based on initial evaluation findings, risk stratification, comorbidities and participant's personal goals.   Expected Outcomes  Short Term: Attend rehab on a regular basis to increase amount of physical activity.   Increase Strength and  Stamina  Yes   Intervention  Provide advice, education, support and counseling about physical activity/exercise needs.;Develop an individualized exercise prescription for aerobic and resistive training based on initial evaluation findings, risk stratification, comorbidities and participant's personal goals.   Expected Outcomes  Short Term: Increase workloads from initial exercise prescription for resistance, speed, and METs.   Able to understand and use rate of perceived exertion (RPE) scale  Yes   Intervention  Provide education and explanation on how to use RPE scale   Expected Outcomes  Short Term: Able to use RPE daily in rehab to express subjective intensity level;Long Term:  Able to use RPE to guide intensity level when exercising independently   Knowledge and understanding of Target Heart Rate Range (  THRR)  Yes   Intervention  Provide education and explanation of THRR including how the numbers were predicted and where they are located for reference   Expected Outcomes  Short Term: Able to state/look up THRR;Long Term: Able to use THRR to govern intensity when exercising independently;Short Term: Able to use daily as guideline for intensity in rehab   Able to check pulse independently  Yes   Intervention  Provide education and demonstration on how to check pulse in carotid and radial arteries.;Review the importance of being able to check your own pulse for safety during independent exercise   Expected Outcomes  Short Term: Able to explain why pulse checking is important during independent exercise;Long Term: Able to check pulse independently and accurately   Understanding of Exercise Prescription  Yes   Intervention  Provide education, explanation, and written materials on patient's individual exercise prescription   Expected Outcomes  Short Term: Able to explain program exercise prescription;Long Term: Able to explain home exercise prescription to exercise independently          Exercise  Goals Re-Evaluation: Exercise Goals Re-Evaluation    Exercise Goal Re-Evaluation    Row Name 12/18/17 0936 01/06/18 1032 01/07/18 1406 01/28/18 1406 03/05/18 1021   Exercise Goals Review  Increase Physical Activity;Increase Strength and Stamina;Able to check pulse independently;Understanding of Exercise Prescription;Able to understand and use rate of perceived exertion (RPE) scale;Knowledge and understanding of Target Heart Rate Range (THRR)  Increase Physical Activity;Increase Strength and Stamina;Able to understand and use rate of perceived exertion (RPE) scale;Knowledge and understanding of Target Heart Rate Range (THRR);Understanding of Exercise Prescription;Able to check pulse independently  Increase Physical Activity;Increase Strength and Stamina;Able to understand and use rate of perceived exertion (RPE) scale;Knowledge and understanding of Target Heart Rate Range (THRR);Understanding of Exercise Prescription;Able to check pulse independently  Increase Physical Activity;Increase Strength and Stamina;Able to understand and use rate of perceived exertion (RPE) scale;Knowledge and understanding of Target Heart Rate Range (THRR);Understanding of Exercise Prescription;Able to check pulse independently  Increase Physical Activity;Increase Strength and Stamina;Able to understand and use rate of perceived exertion (RPE) scale;Knowledge and understanding of Target Heart Rate Range (THRR);Understanding of Exercise Prescription;Able to check pulse independently   Comments  Pt first day of exercise. Pt tolerated exercise well and had an average MET level of 3.4. 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   Reviewed HEP with Pt. Pt was responsive and understand THRR, MET level, exercise precautions, weather precautions, end points of exercise, warm up and cool down. Pt plans to continue exercising at home in addition to Cardiac Rehab.   Reviewed METs and goals with Pt. Pt  is progressing well and is increasing workloads gradually. 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   Reviewed METs and goals with Pt. MET level is 5.3 and progressing. Pt is progressing well and is increasing workloads gradually. 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   Pt has been absent from the prgram for a week. MET level has not changed since last check but Pt is progressing well at current workloads. Pt continues exercising at home in addition to cardiac rehab.    Expected Outcomes  Will continue to monitor and progress Pt as tolerated.   Will continue to monitor and progress Pt as tolerated.   Will continue to monitor and progress Pt as tolerated.  Will continue to monitor and progress Pt as tolerated.   Will continue to monitor and progress Pt as tolerated.        Exercise Goal Re-Evaluation    Row Name 03/25/18 1506   Exercise Goals Review  Increase Physical Activity;Increase Strength and Stamina;Able to understand and use rate of perceived exertion (RPE) scale;Knowledge and understanding of Target Heart Rate Range (THRR);Understanding of Exercise Prescription;Able to check pulse independently   Comments  Pt graduated Cardiac Rehab today. Pt tolerated program well and progressed well. Pt is exercising at a local gym.    Expected Outcomes  Pt will continue exercise Rx at a local gym.           Nutrition & Weight - Outcomes: Pre Biometrics - 12/12/17 0945    Pre Biometrics          Height  5' 10"  (1.778 m)    Weight  83.6 kg    Waist Circumference  39 inches    Hip Circumference  40 inches    Waist to Hip Ratio  0.98 %    BMI (Calculated)  26.44    Triceps Skinfold  22 mm    % Body Fat  28.2 %    Grip Strength  25 kg    Flexibility  10 in    Single Leg Stand  30 seconds          Post Biometrics - 03/12/18 1055     Post  Biometrics          Height  5' 10"  (1.778 m)    Weight  83.3 kg    Waist  Circumference  37 inches    Hip Circumference  38.5 inches    Waist to Hip Ratio  0.96 %    BMI (Calculated)  26.35    Triceps Skinfold  22 mm    % Body Fat  27.2 %    Grip Strength  30 kg    Flexibility  9 in    Single Leg Stand  7 seconds           Nutrition: Nutrition Therapy & Goals - 12/12/17 1025    Nutrition Therapy          Diet  heart healthy        Personal Nutrition Goals          Nutrition Goal  Pt to identify and limit food sources of saturated fat, trans fat, refined carbohydrates and sodium    Personal Goal #2  Pt able to name foods that affect blood glucose        Intervention Plan          Intervention  Prescribe, educate and counsel regarding individualized specific dietary modifications aiming towards targeted core components such as weight, hypertension, lipid management, diabetes, heart failure and other comorbidities.    Expected Outcomes  Short Term Goal: Understand basic principles of dietary content, such as calories, fat, sodium, cholesterol and nutrients.;Long Term Goal: Adherence to prescribed nutrition plan.           Nutrition Discharge: Nutrition Assessments - 04/17/18 1347    MEDFICTS Scores          Pre Score  41    Post Score  12    Score Difference  -29           Education Questionnaire Score: Knowledge Questionnaire Score - 03/19/18 0936    Knowledge Questionnaire Score  Post Score  24/24           Goals reviewed with patient; copy given to patient.

## 2018-04-18 NOTE — Addendum Note (Signed)
Encounter addended by: Lowell Guitar, RN on: 04/18/2018 8:30 AM  Actions taken: Episode resolved

## 2018-05-01 ENCOUNTER — Telehealth: Payer: Self-pay | Admitting: Oncology

## 2018-05-01 NOTE — Telephone Encounter (Signed)
Per 4/2 schedule message added injection for after 4/16 f/u. Start time remains the same.

## 2018-05-12 ENCOUNTER — Other Ambulatory Visit: Payer: Medicare Other

## 2018-05-13 ENCOUNTER — Inpatient Hospital Stay: Payer: Medicare Other | Attending: Oncology

## 2018-05-13 ENCOUNTER — Encounter (HOSPITAL_COMMUNITY): Payer: Self-pay

## 2018-05-13 ENCOUNTER — Other Ambulatory Visit: Payer: Self-pay

## 2018-05-13 ENCOUNTER — Ambulatory Visit (HOSPITAL_COMMUNITY)
Admission: RE | Admit: 2018-05-13 | Discharge: 2018-05-13 | Disposition: A | Discharge status: Home / Self Care | Payer: Medicare Other | Source: Ambulatory Visit | Attending: Oncology | Admitting: Oncology

## 2018-05-13 DIAGNOSIS — C61 Malignant neoplasm of prostate: Secondary | ICD-10-CM

## 2018-05-13 DIAGNOSIS — Z9221 Personal history of antineoplastic chemotherapy: Secondary | ICD-10-CM | POA: Diagnosis not present

## 2018-05-13 DIAGNOSIS — Z79818 Long term (current) use of other agents affecting estrogen receptors and estrogen levels: Secondary | ICD-10-CM | POA: Diagnosis not present

## 2018-05-13 LAB — CMP (CANCER CENTER ONLY)
ALT: 13 U/L (ref 0–44)
AST: 19 U/L (ref 15–41)
Albumin: 3.9 g/dL (ref 3.5–5.0)
Alkaline Phosphatase: 82 U/L (ref 38–126)
Anion gap: 11 (ref 5–15)
BUN: 14 mg/dL (ref 8–23)
CO2: 26 mmol/L (ref 22–32)
Calcium: 9 mg/dL (ref 8.9–10.3)
Chloride: 104 mmol/L (ref 98–111)
Creatinine: 0.87 mg/dL (ref 0.61–1.24)
GFR, Est AFR Am: >60 mL/min (ref >60)
GFR, Estimated: >60 mL/min (ref >60)
Glucose, Bld: 99 mg/dL (ref 70–99)
Potassium: 4.1 mmol/L (ref 3.5–5.1)
Sodium: 141 mmol/L (ref 135–145)
Total Bilirubin: 0.3 mg/dL (ref 0.3–1.2)
Total Protein: 7.3 g/dL (ref 6.5–8.1)

## 2018-05-13 LAB — CBC WITH DIFFERENTIAL (CANCER CENTER ONLY)
Abs Immature Granulocytes: 0 10*3/uL (ref 0.00–0.07)
Basophils Absolute: 0.1 10*3/uL (ref 0.0–0.1)
Basophils Relative: 1 %
Eosinophils Absolute: 0.2 10*3/uL (ref 0.0–0.5)
Eosinophils Relative: 4 %
HCT: 37.8 % — ABNORMAL LOW (ref 39.0–52.0)
Hemoglobin: 12.1 g/dL — ABNORMAL LOW (ref 13.0–17.0)
Immature Granulocytes: 0 %
Lymphocytes Relative: 39 %
Lymphs Abs: 2.1 10*3/uL (ref 0.7–4.0)
MCH: 28 pg (ref 26.0–34.0)
MCHC: 32 g/dL (ref 30.0–36.0)
MCV: 87.5 fL (ref 80.0–100.0)
Monocytes Absolute: 0.7 10*3/uL (ref 0.1–1.0)
Monocytes Relative: 13 %
Neutro Abs: 2.4 10*3/uL (ref 1.7–7.7)
Neutrophils Relative %: 43 %
Platelet Count: 283 10*3/uL (ref 150–400)
RBC: 4.32 MIL/uL (ref 4.22–5.81)
RDW: 13.1 % (ref 11.5–15.5)
WBC Count: 5.5 10*3/uL (ref 4.0–10.5)
nRBC: 0 % (ref 0.0–0.2)

## 2018-05-13 MED ORDER — IOHEXOL 300 MG/ML  SOLN
100.0000 mL | Freq: Once | INTRAMUSCULAR | Status: AC | PRN
  Administered 2018-05-13: 100 mL via INTRAVENOUS

## 2018-05-13 MED ORDER — SODIUM CHLORIDE (PF) 0.9 % IJ SOLN
INTRAMUSCULAR | Status: AC
  Filled 2018-05-13: qty 50

## 2018-05-14 LAB — PROSTATE-SPECIFIC AG, SERUM (LABCORP): Prostate Specific Ag, Serum: <0.1 ng/mL (ref 0.0–4.0)

## 2018-05-15 ENCOUNTER — Telehealth: Payer: Self-pay | Admitting: Oncology

## 2018-05-15 ENCOUNTER — Inpatient Hospital Stay: Payer: Medicare Other

## 2018-05-15 ENCOUNTER — Inpatient Hospital Stay (HOSPITAL_BASED_OUTPATIENT_CLINIC_OR_DEPARTMENT_OTHER): Payer: Medicare Other | Admitting: Oncology

## 2018-05-15 ENCOUNTER — Other Ambulatory Visit: Payer: Self-pay

## 2018-05-15 VITALS — BP 117/70 | HR 78 | Temp 98.3°F | Resp 18 | Ht 70.0 in | Wt 184.8 lb

## 2018-05-15 VITALS — BP 120/65 | HR 60 | Temp 98.3°F | Resp 18

## 2018-05-15 DIAGNOSIS — Z9221 Personal history of antineoplastic chemotherapy: Secondary | ICD-10-CM | POA: Diagnosis not present

## 2018-05-15 DIAGNOSIS — C61 Malignant neoplasm of prostate: Secondary | ICD-10-CM | POA: Diagnosis not present

## 2018-05-15 DIAGNOSIS — Z79818 Long term (current) use of other agents affecting estrogen receptors and estrogen levels: Secondary | ICD-10-CM | POA: Diagnosis not present

## 2018-05-15 MED ORDER — LEUPROLIDE ACETATE (4 MONTH) 30 MG IM KIT
30.0000 mg | PACK | Freq: Once | INTRAMUSCULAR | Status: AC
  Administered 2018-05-15: 30 mg via INTRAMUSCULAR
  Filled 2018-05-15: qty 30

## 2018-05-15 NOTE — Progress Notes (Signed)
Hematology and Oncology Follow Up Visit  Jeff Decker [id number] [birth year] 72 y.o. 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  He was found to have PSA of 262 and a Gleason score 4+5 = 9 at the time of diagnosis.   Prior Therapy:  Taxotere chemotherapy at 75 mg/m cycle one given on 06/13/2016. He is S/P 6 cycles concluded on 09/25/2016.   Current therapy:  Lupron 30 mg every 4 months starting on 01/15/2018 at the cancer center.  Next injection will be in April 2020.    Interim History: Jeff Decker is here for a follow-up.  Since last visit, he reports no major changes in his health.  He continues to feel well without any recent hospitalizations or illnesses.  He denies any weight gain or appetite changes.  He denies any hot flashes or bone pain.  Continues to be active and attends activities of daily living.  Patient denied any alteration mental status, neuropathy, confusion or dizziness.  Denies any headaches or lethargy.  Denies any night sweats, weight loss or changes in appetite.  Denied orthopnea, dyspnea on exertion or chest discomfort.  Denies shortness of breath, difficulty breathing hemoptysis or cough.  Denies any abdominal distention, nausea, early satiety or dyspepsia.  Denies any hematuria, frequency, dysuria or nocturia.  Denies any skin irritation, dryness or rash.  Denies any ecchymosis or petechiae.  Denies any lymphadenopathy or clotting.  Denies any heat or cold intolerance.  Denies any anxiety or depression.  Remaining review of system is negative.      Medications: I have reviewed the patient's current medications.  Current Outpatient Medications  Medication Sig Dispense Refill  . acetaminophen (TYLENOL) 325 MG tablet Take 2 tablets (650 mg total) by mouth every 6 (six) hours as needed for mild  pain.    Marland Kitchen aspirin EC 81 MG tablet Take 81 mg by mouth daily.    . Calcium Carb-Cholecalciferol (CALCIUM 600+D) 600-800 MG-UNIT TABS Take 1 tablet by mouth daily.     . Evolocumab (REPATHA SURECLICK) 469 MG/ML SOAJ Inject 1 pen into the skin every 14 (fourteen) days. 2 pen 11  . fexofenadine (ALLEGRA) 180 MG tablet Take 180 mg by mouth daily.    Marland Kitchen glucosamine-chondroitin 500-400 MG tablet Take 1 tablet by mouth daily.     . metoprolol tartrate (LOPRESSOR) 25 MG tablet Take 0.5 tablets (12.5 mg total) by mouth 2 (two) times daily. 90 tablet 3  . Multiple Vitamin (MULTI-VITAMINS) TABS Take by mouth.    Marland Kitchen omeprazole (PRILOSEC) 20 MG capsule Take 20 mg by mouth daily before breakfast.  1   No current facility-administered medications for this visit.      Allergies:  Allergies  Allergen Reactions  . Statins Other (See Comments)    Muscle pain Tried 3 different statins    Past Medical History, Surgical history, Social history, and Family History reviewed and remain unchanged.   Physical Exam:  Blood pressure 117/70, pulse 78, temperature 98.3 F (36.8 C), temperature source Oral, resp. rate 18, height 5\' 10"  (1.778 m), weight 184 lb 12.8 oz (83.8 kg), SpO2 99 %.    ECOG: 0    General appearance: Alert, awake without any distress. Head: Atraumatic without abnormalities Oropharynx: Without any thrush or ulcers. Eyes: No scleral icterus. Lymph nodes: No lymphadenopathy noted in the cervical, supraclavicular, or axillary nodes Heart:regular rate and rhythm, without any  murmurs or gallops.   Lung: Clear to auscultation without any rhonchi, wheezes or dullness to percussion. Abdomin: Soft, nontender without any shifting dullness or ascites. Musculoskeletal: No clubbing or cyanosis. Neurological: No motor or sensory deficits. Skin: No rashes or lesions.     Lab Results: Lab Results  Component Value Date   WBC 5.5 05/13/2018   HGB 12.1 (L) 05/13/2018   HCT 37.8 (L) 05/13/2018    MCV 87.5 05/13/2018   PLT 283 05/13/2018     Chemistry      Component Value Date/Time   NA 141 05/13/2018 0750   NA 138 10/22/2017 1211   NA 139 11/20/2016 0800   K 4.1 05/13/2018 0750   K 4.2 11/20/2016 0800   CL 104 05/13/2018 0750   CO2 26 05/13/2018 0750   CO2 24 11/20/2016 0800   BUN 14 05/13/2018 0750   BUN 19 10/22/2017 1211   BUN 16.7 11/20/2016 0800   CREATININE 0.87 05/13/2018 0750   CREATININE 0.8 11/20/2016 0800      Component Value Date/Time   CALCIUM 9.0 05/13/2018 0750   CALCIUM 9.1 11/20/2016 0800   ALKPHOS 82 05/13/2018 0750   ALKPHOS 58 11/20/2016 0800   AST 19 05/13/2018 0750   AST 18 11/20/2016 0800   ALT 13 05/13/2018 0750   ALT 16 11/20/2016 0800   BILITOT 0.3 05/13/2018 0750   BILITOT 0.40 11/20/2016 0800     EXAM: CT ABDOMEN AND PELVIS WITH CONTRAST  TECHNIQUE: Multidetector CT imaging of the abdomen and pelvis was performed using the standard protocol following bolus administration of intravenous contrast.  CONTRAST:  140mL OMNIPAQUE IOHEXOL 300 MG/ML  SOLN  COMPARISON:  CT the abdomen and pelvis 11/20/2016.  FINDINGS: Lower chest: Atherosclerotic calcifications in the left anterior descending, left circumflex and right coronary arteries. Myocardial thinning and subendocardial hypoattenuation throughout the left ventricular apically myocardium, likely to reflect fibrofatty metaplasia and scarring from prior distal LAD territory myocardial infarction(s).  Hepatobiliary: No suspicious cystic or solid hepatic lesions. No intra or extrahepatic biliary ductal dilatation. Gallbladder is normal in appearance.  Pancreas: No pancreatic mass. No pancreatic ductal dilatation. No pancreatic or peripancreatic fluid or inflammatory changes.  Spleen: Unremarkable.  Adrenals/Urinary Tract: Bilateral kidneys and adrenal glands are normal in appearance. No hydroureteronephrosis. Urinary bladder is normal in  appearance.  Stomach/Bowel: Normal appearance of the stomach. No pathologic dilatation of small bowel or colon. Normal appendix.  Vascular/Lymphatic: Aortic atherosclerosis with fusiform ectasia of the infrarenal abdominal aorta which measures up to 2.7 x 2.4 cm. No aneurysm or dissection noted in the abdominal or pelvic vasculature. No lymphadenopathy noted in the abdomen or pelvis.  Reproductive: Prostate gland is grossly unremarkable in appearance.  Other: No significant volume of ascites.  No pneumoperitoneum.  Musculoskeletal: Decreased size of previously noted right iliac sclerotic lesion which currently measures 1.5 cm (axial image 67 of series 2), decreased from 2.1 cm on prior study 11/20/2016. Additionally, the previously noted sclerotic lesion in L5 appears slightly smaller, currently measuring 1.3 cm (axial image 58 of series 2) as compared to 1.7 cm on the prior examination. Other sclerotic osseous lesions are also less apparent when compared to the prior examination.  IMPRESSION: 1. Improving sclerotic lesions throughout the visualized skeleton, suggesting a positive response to therapy. 2. No new extra skeletal metastatic disease noted elsewhere in the abdomen or pelvis. 3. Aortic atherosclerosis, in addition to at least 3 vessel coronary artery disease with evidence of prior distal LAD territory myocardial infarction(s), as above.  4. Additional incidental findings, as above    Impression and Plan:  73 year old man with:  1.  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    He completed Taxotere chemotherapy with PSA is currently undetectable.  CT scan obtained on 05/13/2018 was personally reviewed and showed improving disease status without any abdominal adenopathy or new bone disease.  Alternative treatment options if his PSA starts to rise include systemic chemotherapy in the form of Jevtana versus Zytiga or  Xtandi.  At this time I recommended continuing androgen deprivation therapy alone.  2. Androgen deprivation therapy: He is currently on Lupron which she will receive every 4 months.  He has tolerated therapy well without any side effects.  I continue to educate him about potential complications including weight gain, hot flashes among others.  3. Bone directed therapy: Very limited at this time I recommended continuing calcium and vitamin D supplements.  4.  Prognosis and goals of care: Therapy remains palliative although his disease is under excellent control and aggressive therapy is warranted.   5. Follow-up: We will be in 4 months for repeat laboratory testing and Lupron injection.  15  minutes was spent with the patient face-to-face today.  More than 50% of time was spent on reviewing his disease status, treatment options and reviewing imaging studies.     Zola Button, MD 4/16/20203:06 PM

## 2018-05-15 NOTE — Patient Instructions (Signed)

## 2018-05-15 NOTE — Telephone Encounter (Signed)
Scheduled august appt per sch msg. Mailed printout

## 2018-08-07 IMAGING — CT CT ABD-PELV W/ CM
2 of 5 series · 15 of 46 positions shown, 17 images · IV contrast (ISOVUE 300)
Comparison: 05/23/2016

CLINICAL DATA: Prostate cancer

EXAM:
CT ABDOMEN AND PELVIS WITH CONTRAST
TECHNIQUE: Multidetector CT imaging of the abdomen and pelvis was performed
using the standard protocol following bolus administration of
intravenous contrast.
CONTRAST:  100mL C8KEM4-P66 IOPAMIDOL (C8KEM4-P66) INJECTION 61%

[Series 2: axial st · axial · 0.80mm/px · z∈[-852,-412]mm · 12 of 102 slices shown, 14 images]
[im 7/102  soft-tissue]
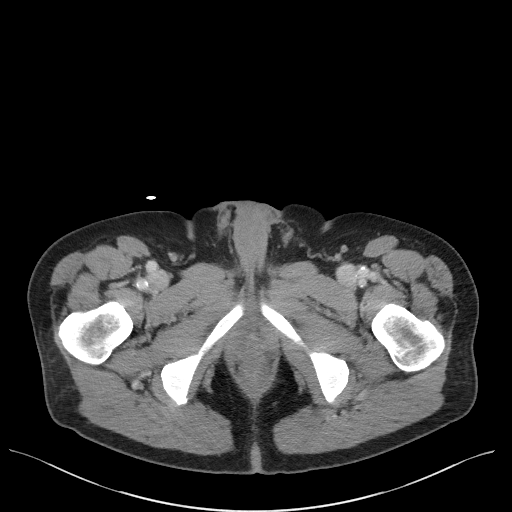
[im 7/102  bone]
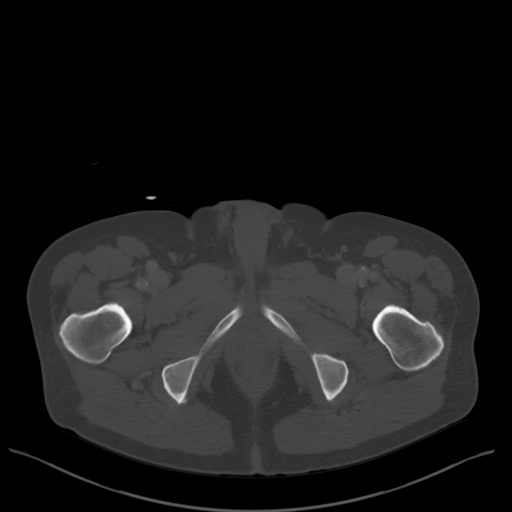
[im 13/102  soft-tissue]
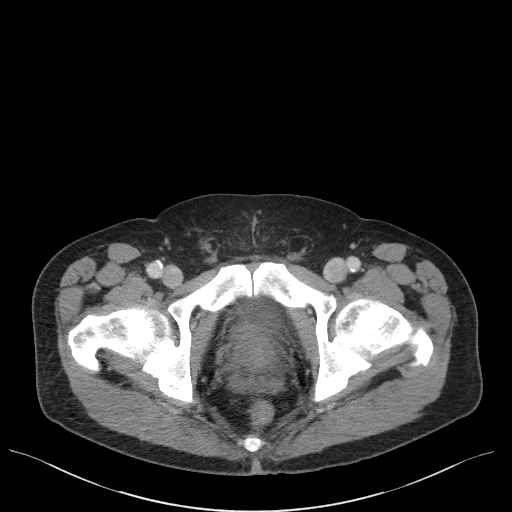
[im 26/102  soft-tissue]
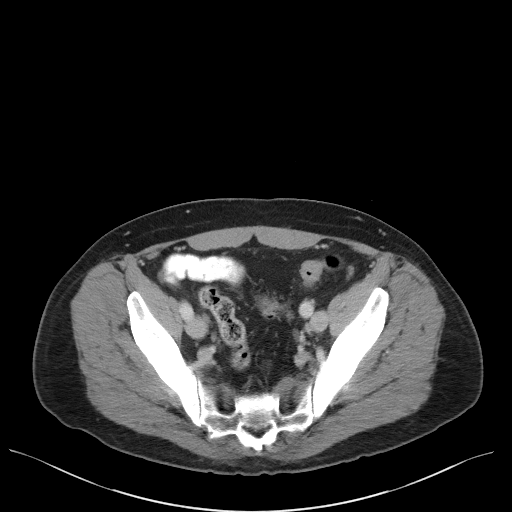
[im 32/102  soft-tissue]
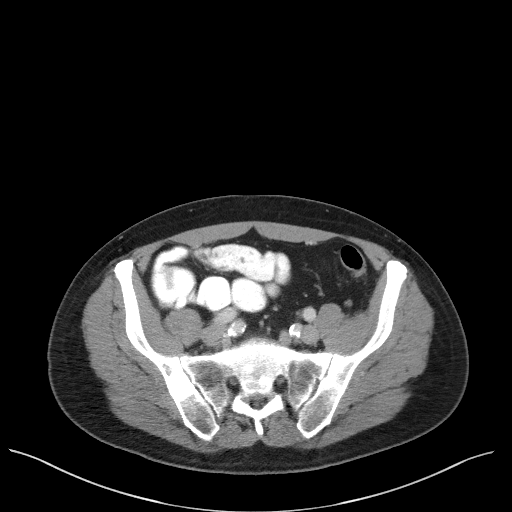
[im 38/102  soft-tissue]
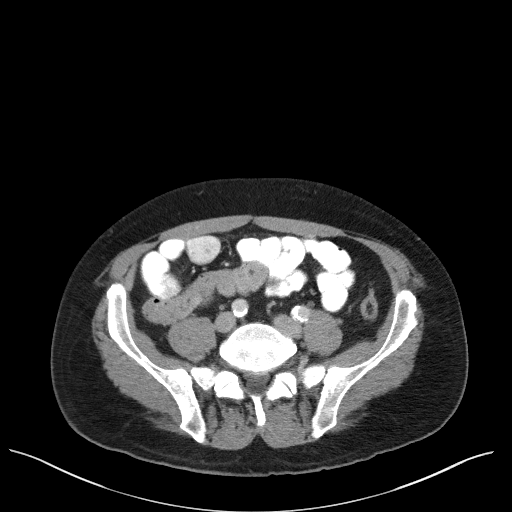
[im 45/102  soft-tissue]
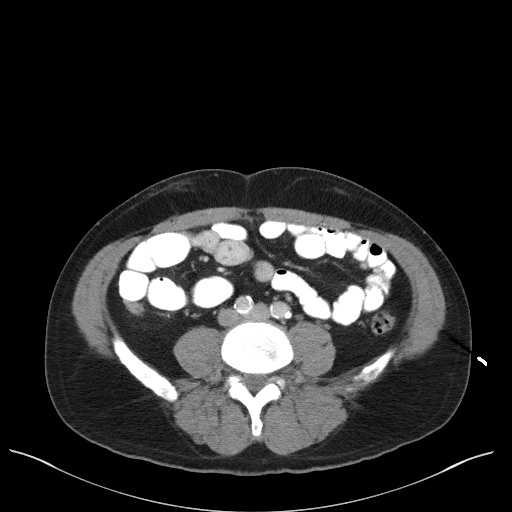
[im 57/102  soft-tissue]
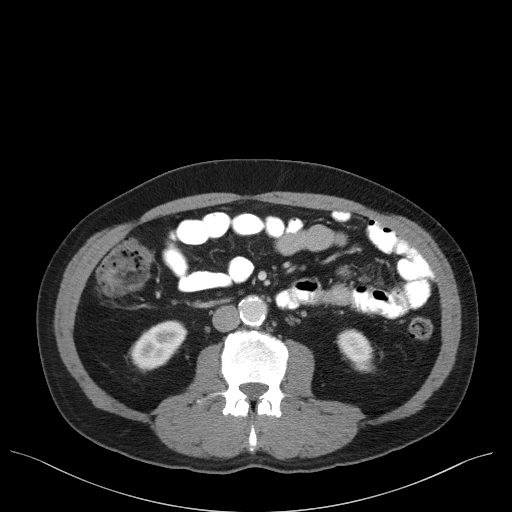
[im 64/102  soft-tissue]
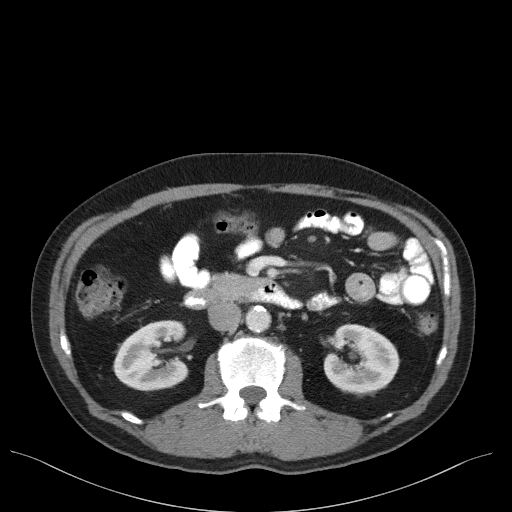
[im 70/102  soft-tissue]
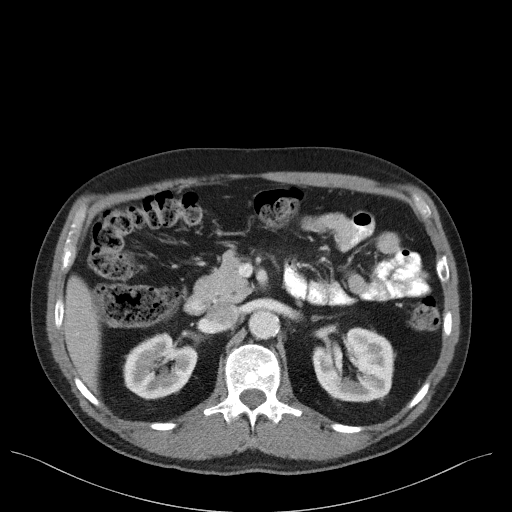
[im 70/102  bone]
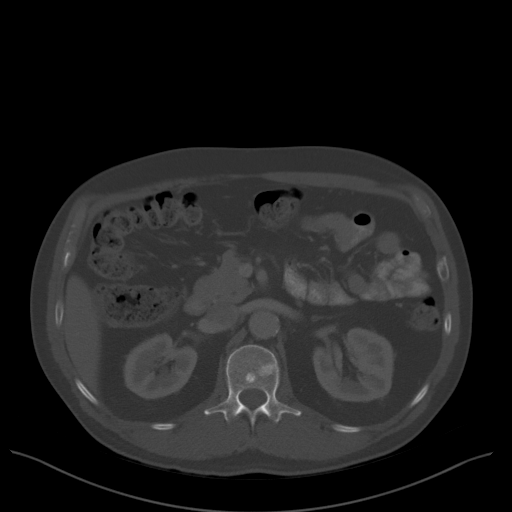
[im 76/102  soft-tissue]
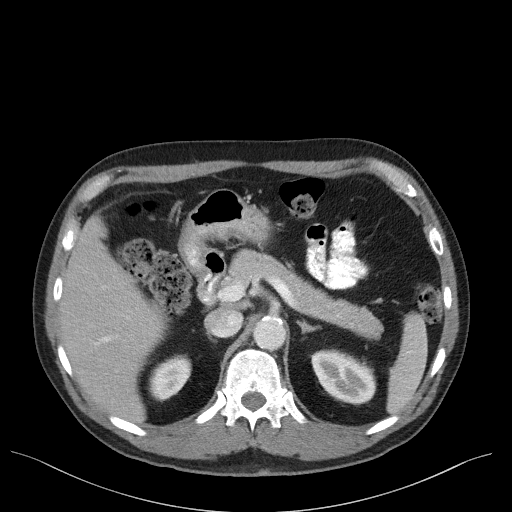
[im 89/102  soft-tissue]
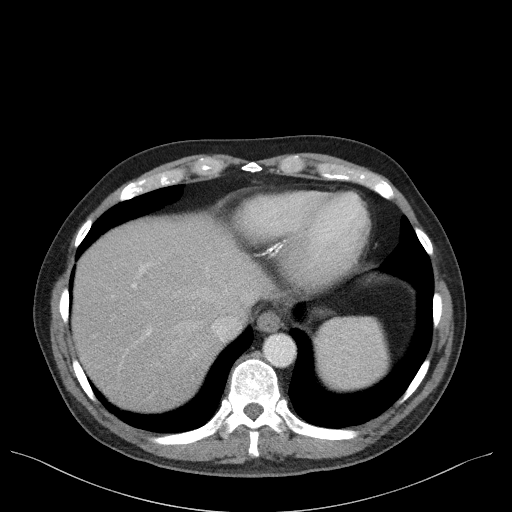
[im 95/102  soft-tissue]
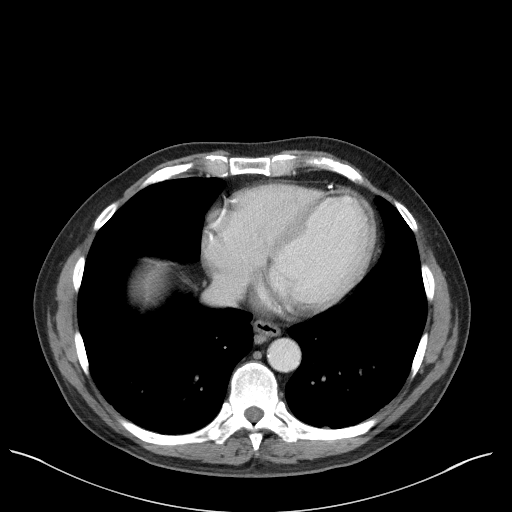

[Series 5: coronal st · coronal · 0.79mm/px · 3 of 85 slices shown]
[im 29/85  soft-tissue]
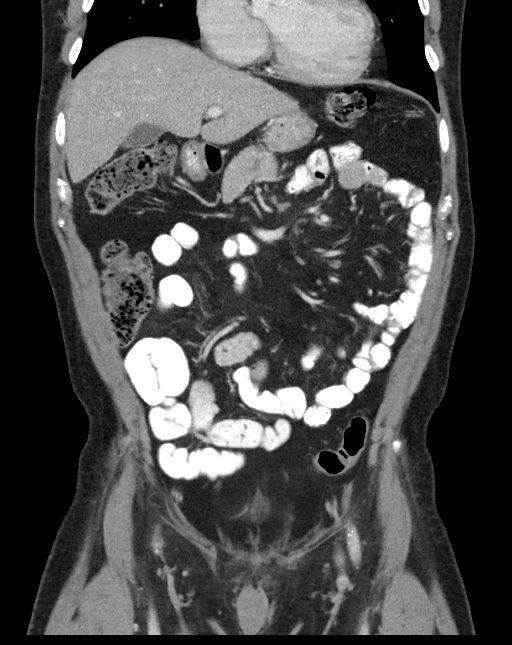
[im 38/85  soft-tissue]
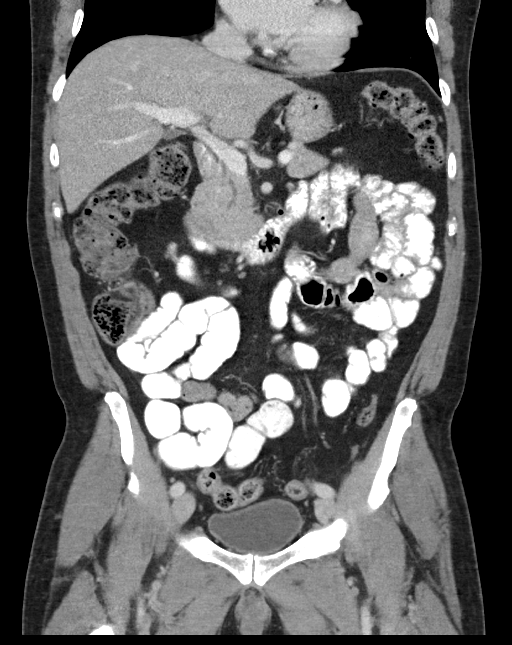
[im 47/85  soft-tissue]
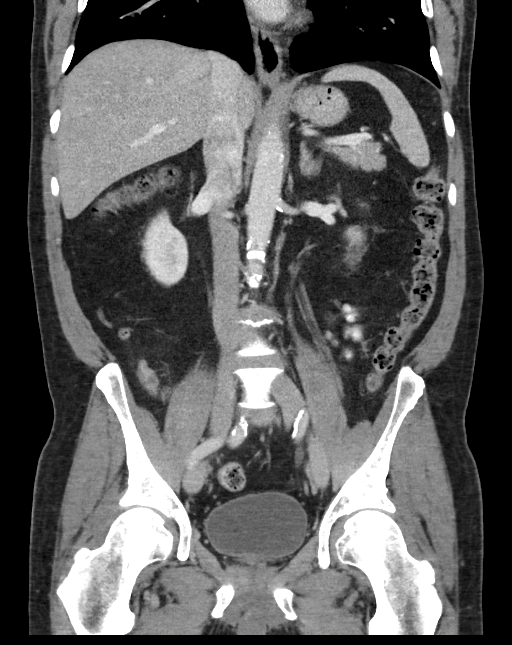

[15 of 46 positions shown; findings below may reference images not displayed]

FINDINGS: Lower chest: Coronary artery calcification is evident. Tiny left
lower lobe nodule seen on the prior study has resolved.

Hepatobiliary: Tiny hypoattenuating lesion in the central liver
(image 15 series 2) is too small to characterize but unchanged in
the interval. Liver otherwise unremarkable. There is no evidence for
gallstones, gallbladder wall thickening, or pericholecystic fluid.
No intrahepatic or extrahepatic biliary dilation.

Pancreas: No focal mass lesion. No dilatation of the main duct. No
intraparenchymal cyst. No peripancreatic edema.

Spleen: No splenomegaly. No focal mass lesion.

Adrenals/Urinary Tract: No adrenal nodule or mass. Kidneys
unremarkable No evidence for hydroureter. The urinary bladder
appears normal for the degree of distention.

Stomach/Bowel: Stomach is nondistended. No gastric wall thickening.
No evidence of outlet obstruction. Duodenum is normally positioned
as is the ligament of Treitz. No small bowel wall thickening. No
small bowel dilatation. The terminal ileum is normal. The appendix
is normal. No gross colonic mass. No colonic wall thickening. No
substantial diverticular change.

Vascular/Lymphatic: There is abdominal aortic atherosclerosis
without aneurysm. There is no gastrohepatic or hepatoduodenal
ligament lymphadenopathy. No intraperitoneal or retroperitoneal
lymphadenopathy. No pelvic sidewall lymphadenopathy. 18 mm left
pelvic sidewall lymph node measured on the prior study is now 5 mm.
24 mm right pelvic sidewall short axis lymph node measured
previously is now 5 mm short axis. 10 mm short axis lymph nodes seen
on the prior study at the proximal left common femoral artery is now
3 mm.

Reproductive: Prostatomegaly seen previously has decreased in the
interval

Other: No intraperitoneal free fluid.

Musculoskeletal: Similar appearance 2.1 cm right iliac sclerotic
lesion. No substantial change 1.7 cm L5 lesion. Other sclerotic bony
lesions are similar without new or progressive disease evident.
IMPRESSION: 1. Interval resolution of pelvic lymphadenopathy seen previously.
2. Stable appearance of scattered sclerotic bone lesions.
3. Interval decrease in size of the prostate gland.
4. Interval resolution of the tiny left lower lobe pulmonary nodule
seen previously.
5. No new or progressive findings on today's study.
6.  Aortic Atherosclerois ([id number])

## 2018-09-16 ENCOUNTER — Other Ambulatory Visit: Payer: Self-pay

## 2018-09-16 ENCOUNTER — Inpatient Hospital Stay: Payer: Medicare Other | Attending: Oncology

## 2018-09-16 DIAGNOSIS — C61 Malignant neoplasm of prostate: Secondary | ICD-10-CM | POA: Diagnosis not present

## 2018-09-16 DIAGNOSIS — Z79899 Other long term (current) drug therapy: Secondary | ICD-10-CM | POA: Insufficient documentation

## 2018-09-16 LAB — CBC WITH DIFFERENTIAL (CANCER CENTER ONLY)
Abs Immature Granulocytes: 0.02 10*3/uL (ref 0.00–0.07)
Basophils Absolute: 0.1 10*3/uL (ref 0.0–0.1)
Basophils Relative: 1 %
Eosinophils Absolute: 0.2 10*3/uL (ref 0.0–0.5)
Eosinophils Relative: 2 %
HCT: 38.2 % — ABNORMAL LOW (ref 39.0–52.0)
Hemoglobin: 12.6 g/dL — ABNORMAL LOW (ref 13.0–17.0)
Immature Granulocytes: 0 %
Lymphocytes Relative: 26 %
Lymphs Abs: 1.8 10*3/uL (ref 0.7–4.0)
MCH: 28.8 pg (ref 26.0–34.0)
MCHC: 33 g/dL (ref 30.0–36.0)
MCV: 87.4 fL (ref 80.0–100.0)
Monocytes Absolute: 0.7 10*3/uL (ref 0.1–1.0)
Monocytes Relative: 10 %
Neutro Abs: 4.2 10*3/uL (ref 1.7–7.7)
Neutrophils Relative %: 61 %
Platelet Count: 261 10*3/uL (ref 150–400)
RBC: 4.37 MIL/uL (ref 4.22–5.81)
RDW: 12.9 % (ref 11.5–15.5)
WBC Count: 7 10*3/uL (ref 4.0–10.5)
nRBC: 0 % (ref 0.0–0.2)

## 2018-09-16 LAB — CMP (CANCER CENTER ONLY)
ALT: 24 U/L (ref 0–44)
AST: 23 U/L (ref 15–41)
Albumin: 4.2 g/dL (ref 3.5–5.0)
Alkaline Phosphatase: 79 U/L (ref 38–126)
Anion gap: 10 (ref 5–15)
BUN: 19 mg/dL (ref 8–23)
CO2: 26 mmol/L (ref 22–32)
Calcium: 9.5 mg/dL (ref 8.9–10.3)
Chloride: 106 mmol/L (ref 98–111)
Creatinine: 0.92 mg/dL (ref 0.61–1.24)
GFR, Est AFR Am: >60 mL/min (ref >60)
GFR, Estimated: >60 mL/min (ref >60)
Glucose, Bld: 109 mg/dL — ABNORMAL HIGH (ref 70–99)
Potassium: 4.7 mmol/L (ref 3.5–5.1)
Sodium: 142 mmol/L (ref 135–145)
Total Bilirubin: 0.4 mg/dL (ref 0.3–1.2)
Total Protein: 7.5 g/dL (ref 6.5–8.1)

## 2018-09-17 LAB — PROSTATE-SPECIFIC AG, SERUM (LABCORP): Prostate Specific Ag, Serum: <0.1 ng/mL (ref 0.0–4.0)

## 2018-09-18 ENCOUNTER — Other Ambulatory Visit: Payer: Self-pay

## 2018-09-18 ENCOUNTER — Inpatient Hospital Stay: Payer: Medicare Other

## 2018-09-18 ENCOUNTER — Inpatient Hospital Stay: Payer: Medicare Other | Admitting: Oncology

## 2018-09-18 VITALS — BP 116/71 | HR 59 | Temp 97.8°F | Resp 19 | Ht 70.0 in | Wt 186.5 lb

## 2018-09-18 DIAGNOSIS — C61 Malignant neoplasm of prostate: Secondary | ICD-10-CM

## 2018-09-18 MED ORDER — LEUPROLIDE ACETATE (4 MONTH) 30 MG IM KIT
30.0000 mg | PACK | Freq: Once | INTRAMUSCULAR | Status: AC
  Administered 2018-09-18: 30 mg via INTRAMUSCULAR
  Filled 2018-09-18: qty 30

## 2018-09-18 NOTE — Progress Notes (Signed)
Hematology and Oncology Follow Up Visit  Jeff Decker [id number] [birth year] 73 y.o. 09/18/2018 9:32 AM [facility] [place]   Principle Diagnosis: 73 year old man with advanced prostate cancer diagnosed in 2018.  He presented with castration-sensitive disease with lymphadenopathy, PSA of 262 and a Gleason score 4+5 = 9.    Prior Therapy:  Taxotere chemotherapy at 75 mg/m cycle one given on 06/13/2016. He is S/P 6 cycles concluded on 09/25/2016.   Current therapy:  Lupron 30 mg every 4 months starting on 01/15/2018 at the cancer center.  He will receive a Lupron on 09/18/2018 and repeated in 4 months.    Interim History: Mr. Defelice presents today for a follow-up.  Since the last visit, he reports no major changes in his health.  He denies any recent hospitalization or illnesses.  He denies any hot flashes or excessive fatigue.  He denies any decline in his quality of life or bone pain.  He denies any complications with Lupron.  Continues to be active and exercises regularly.  He denied headaches, blurry vision, syncope or seizures.  Denies any fevers, chills or sweats.  Denied chest pain, palpitation, orthopnea or leg edema.  Denied cough, wheezing or hemoptysis.  Denied nausea, vomiting or abdominal pain.  Denies any constipation or diarrhea.  Denies any frequency urgency or hesitancy.  Denies any arthralgias or myalgias.  Denies any skin rashes or lesions.  Denies any bleeding or clotting tendency.  Denies any easy bruising.  Denies any hair or nail changes.  Denies any anxiety or depression.  Remaining review of system is negative.   .      Medications: Reviewed today without any changes. Current Outpatient Medications  Medication Sig Dispense Refill  . acetaminophen (TYLENOL) 325 MG tablet Take 2 tablets (650 mg total) by mouth every 6 (six) hours as needed for mild pain.    Marland Kitchen aspirin EC 81 MG tablet Take 81 mg by mouth daily.     . Calcium Carb-Cholecalciferol (CALCIUM 600+D) 600-800 MG-UNIT TABS Take 1 tablet by mouth daily.     . Evolocumab (REPATHA SURECLICK) 761 MG/ML SOAJ Inject 1 pen into the skin every 14 (fourteen) days. 2 pen 11  . fexofenadine (ALLEGRA) 180 MG tablet Take 180 mg by mouth daily.    Marland Kitchen glucosamine-chondroitin 500-400 MG tablet Take 1 tablet by mouth daily.     . metoprolol tartrate (LOPRESSOR) 25 MG tablet Take 0.5 tablets (12.5 mg total) by mouth 2 (two) times daily. 90 tablet 3  . Multiple Vitamin (MULTI-VITAMINS) TABS Take by mouth.    Marland Kitchen omeprazole (PRILOSEC) 20 MG capsule Take 20 mg by mouth daily before breakfast.  1   No current facility-administered medications for this visit.      Allergies:  Allergies  Allergen Reactions  . Statins Other (See Comments)    Muscle pain Tried 3 different statins    Past Medical History, Surgical history, Social history, and Family History updated without any changes.   Physical Exam:  Blood pressure 116/71, pulse (!) 59, temperature 97.8 F (36.6 C), temperature source Oral, resp. rate 19, height 5\' 10"  (1.778 m), weight 186 lb 8 oz (84.6 kg), SpO2 99 %.    ECOG: 0    General appearance: Comfortable appearing without any discomfort Head: Normocephalic without any trauma Oropharynx: Mucous membranes are moist and pink without any thrush or ulcers. Eyes: Pupils are equal and round reactive to light. Lymph nodes: No cervical, supraclavicular, inguinal or axillary lymphadenopathy.  Heart:regular rate and rhythm.  S1 and S2 without leg edema. Lung: Clear without any rhonchi or wheezes.  No dullness to percussion. Abdomin: Soft, nontender, nondistended with good bowel sounds.  No hepatosplenomegaly. Musculoskeletal: No joint deformity or effusion.  Full range of motion noted. Neurological: No deficits noted on motor, sensory and deep tendon reflex exam. Skin: No petechial rash or dryness.  Appeared moist.       Lab Results: Lab  Results  Component Value Date   WBC 7.0 09/16/2018   HGB 12.6 (L) 09/16/2018   HCT 38.2 (L) 09/16/2018   MCV 87.4 09/16/2018   PLT 261 09/16/2018     Chemistry      Component Value Date/Time   NA 142 09/16/2018 1117   NA 138 10/22/2017 1211   NA 139 11/20/2016 0800   K 4.7 09/16/2018 1117   K 4.2 11/20/2016 0800   CL 106 09/16/2018 1117   CO2 26 09/16/2018 1117   CO2 24 11/20/2016 0800   BUN 19 09/16/2018 1117   BUN 19 10/22/2017 1211   BUN 16.7 11/20/2016 0800   CREATININE 0.92 09/16/2018 1117   CREATININE 0.8 11/20/2016 0800      Component Value Date/Time   CALCIUM 9.5 09/16/2018 1117   CALCIUM 9.1 11/20/2016 0800   ALKPHOS 79 09/16/2018 1117   ALKPHOS 58 11/20/2016 0800   AST 23 09/16/2018 1117   AST 18 11/20/2016 0800   ALT 24 09/16/2018 1117   ALT 16 11/20/2016 0800   BILITOT 0.4 09/16/2018 1117   BILITOT 0.40 11/20/2016 0800     Results for Decker "Jeff" (MRN [id number]) as of 09/18/2018 09:25  Ref. Range 05/13/2018 07:50 09/16/2018 11:17  Prostate Specific Ag, Serum Latest Ref Range: 0.0 - 4.0 ng/mL <0.1 <0.1     Impression and Plan:  73 year old man with:  1.  Advanced prostate cancer with lymphadenopathy diagnosed in April 2018.  He remains at castration-sensitive status.     The natural course of this disease as well as treatment options for developed castration hyper resistant disease were reiterated.  His options include Nicki Reaper repeat chemotherapy.  Long-term complication associated with these therapies were also reviewed but at this time I see no indication unless he develops a rise in his PSA.  His PSA continues to be undetectable at this time.  2. Androgen deprivation therapy: He will receive Lupron today and repeated in 4 months.  Long-term complications occluding osteoporosis, weight gain and actual dysfunction were reiterated.  3. Bone directed therapy: I recommended continuing calcium and vitamin D.  4.  Prognosis and  goals of care: Any therapy remains palliative at this time although aggressive measures are warranted given his excellent performance status and reasonable response.   5. Follow-up: In 4 months for a repeat evaluation.  He will receive Lupron at that time as well.  25  minutes was spent with the patient face-to-face today.  More than 50% of time was dedicated to reviewing his laboratory data, discussing the natural course of disease, complications related to future therapies and outlining future plan of care.   Zola Button, MD 8/20/20209:32 AM

## 2018-09-18 NOTE — Patient Instructions (Signed)
Leuprolide injection What is this medicine? LEUPROLIDE (loo PROE lide) is a man-made hormone. It is used to treat the symptoms of prostate cancer. This medicine may also be used to treat children with early onset of puberty. It may be used for other hormonal conditions. This medicine may be used for other purposes; ask your health care provider or pharmacist if you have questions. COMMON BRAND NAME(S): Lupron What should I tell my health care provider before I take this medicine? They need to know if you have any of these conditions:  diabetes  heart disease or previous heart attack  high blood pressure  high cholesterol  pain or difficulty passing urine  spinal cord metastasis  stroke  tobacco smoker  an unusual or allergic reaction to leuprolide, benzyl alcohol, other medicines, foods, dyes, or preservatives  pregnant or trying to get pregnant  breast-feeding How should I use this medicine? This medicine is for injection under the skin or into a muscle. You will be taught how to prepare and give this medicine. Use exactly as directed. Take your medicine at regular intervals. Do not take your medicine more often than directed. It is important that you put your used needles and syringes in a special sharps container. Do not put them in a trash can. If you do not have a sharps container, call your pharmacist or healthcare provider to get one. A special MedGuide will be given to you by the pharmacist with each prescription and refill. Be sure to read this information carefully each time. Talk to your pediatrician regarding the use of this medicine in children. While this medicine may be prescribed for children as young as 8 years for selected conditions, precautions do apply. Overdosage: If you think you have taken too much of this medicine contact a poison control center or emergency room at once. NOTE: This medicine is only for you. Do not share this medicine with others. What if  I miss a dose? If you miss a dose, take it as soon as you can. If it is almost time for your next dose, take only that dose. Do not take double or extra doses. What may interact with this medicine? Do not take this medicine with any of the following medications:  chasteberry This medicine may also interact with the following medications:  herbal or dietary supplements, like black cohosh or DHEA  male hormones, like estrogens or progestins and birth control pills, patches, rings, or injections  male hormones, like testosterone This list may not describe all possible interactions. Give your health care provider a list of all the medicines, herbs, non-prescription drugs, or dietary supplements you use. Also tell them if you smoke, drink alcohol, or use illegal drugs. Some items may interact with your medicine. What should I watch for while using this medicine? Visit your doctor or health care professional for regular checks on your progress. During the first week, your symptoms may get worse, but then will improve as you continue your treatment. You may get hot flashes, increased bone pain, increased difficulty passing urine, or an aggravation of nerve symptoms. Discuss these effects with your doctor or health care professional, some of them may improve with continued use of this medicine. Male patients may experience a menstrual cycle or spotting during the first 2 months of therapy with this medicine. If this continues, contact your doctor or health care professional. This medicine may increase blood sugar. Ask your healthcare provider if changes in diet or medicines are needed if   you have diabetes. What side effects may I notice from receiving this medicine? Side effects that you should report to your doctor or health care professional as soon as possible:  allergic reactions like skin rash, itching or hives, swelling of the face, lips, or tongue  breathing problems  chest  pain  depression or memory disorders  pain in your legs or groin  pain at site where injected  severe headache  signs and symptoms of high blood sugar such as being more thirsty or hungry or having to urinate more than normal. You may also feel very tired or have blurry vision  swelling of the feet and legs  visual changes  vomiting Side effects that usually do not require medical attention (report to your doctor or health care professional if they continue or are bothersome):  breast swelling or tenderness  decrease in sex drive or performance  diarrhea  hot flashes  loss of appetite  muscle, joint, or bone pains  nausea  redness or irritation at site where injected  skin problems or acne This list may not describe all possible side effects. Call your doctor for medical advice about side effects. You may report side effects to FDA at 1-800-FDA-1088. Where should I keep my medicine? Keep out of the reach of children. Store below 25 degrees C (77 degrees F). Do not freeze. Protect from light. Do not use if it is not clear or if there are particles present. Throw away any unused medicine after the expiration date. NOTE: This sheet is a summary. It may not cover all possible information. If you have questions about this medicine, talk to your doctor, pharmacist, or health care provider.  2020 [employer]vier/Gold Standard (2017-11-14 09:52:48)  

## 2018-10-14 ENCOUNTER — Encounter: Payer: Self-pay | Admitting: Cardiology

## 2018-10-14 ENCOUNTER — Other Ambulatory Visit: Payer: Self-pay

## 2018-10-14 ENCOUNTER — Ambulatory Visit (INDEPENDENT_AMBULATORY_CARE_PROVIDER_SITE_OTHER): Payer: Medicare Other | Admitting: Cardiology

## 2018-10-14 VITALS — BP 130/60 | HR 61 | Ht 70.0 in | Wt 188.0 lb

## 2018-10-14 DIAGNOSIS — E782 Mixed hyperlipidemia: Secondary | ICD-10-CM | POA: Diagnosis not present

## 2018-10-14 DIAGNOSIS — I259 Chronic ischemic heart disease, unspecified: Secondary | ICD-10-CM | POA: Diagnosis not present

## 2018-10-14 DIAGNOSIS — Z951 Presence of aortocoronary bypass graft: Secondary | ICD-10-CM | POA: Diagnosis not present

## 2018-10-14 DIAGNOSIS — I6523 Occlusion and stenosis of bilateral carotid arteries: Secondary | ICD-10-CM | POA: Diagnosis not present

## 2018-10-14 NOTE — Patient Instructions (Signed)
Medication Instructions:  The current medical regimen is effective;  continue present plan and medications.  If you need a refill on your cardiac medications before your next appointment, please call your pharmacy.   Follow-Up: At [facility], you and your health needs are our priority.  As part of our continuing mission to provide you with exceptional heart care, we have created designated Provider Care Teams.  These Care Teams include your primary Cardiologist (physician) and Advanced Practice Providers (APPs -  Physician Assistants and Nurse Practitioners) who all work together to provide you with the care you need, when you need it. You will need a follow up appointment in 12 months.  Please call our office 2 months in advance to schedule this appointment.  You may see Mark Skains, MD or one of the following Advanced Practice Providers on your designated Care Team:   Lori Gerhardt, NP Laura Ingold, NP . Jill McDaniel, NP  Thank you for choosing [facility]!!      

## 2018-10-14 NOTE — Progress Notes (Signed)
Cardiology Office Note:    Date:  10/14/2018   ID:  Jeff Decker, DOB August 12, 1945, MRN [id number]  PCP:  [place]  Cardiologist:  Candee Furbish, MD  Electrophysiologist:  None   Referring MD: Josefa Half*     History of Present Illness:    Jeff Decker is a 73 y.o. male with coronary artery disease status post CABG x4 LIMA to LAD, SVG to diagonal, SVG to distal left circumflex, SVG to PDA on 10/03/2017 by Dr. Cyndia Bent here for follow-up of CAD.  He has had trouble in the past with statin intolerance so Repatha has been requested.  Understands lifting restrictions.  He also understands that it will be at least 3 months until he rides a horse again or complete golf in the spring. Repatha started 11/12/17.   Also has an ischemic cardiomyopathy with a EF of approximately 35 to 45% prior to CABG.  After CABG, EF was restored to low normal 50 to 55%.  Overall been doing very well.  Feels great.  No fevers chills nausea vomiting syncope bleeding.  No need for dental antibiotics.  We discussed.  His wife and he enjoys horses.  10/14/18 - CAD follow up. 5-6 miles a day. Back on horses. Arthritis in hands.  LDL excellent on Repatha.  No side effects.  Is worried about cost now that he is in the donut hole.  Talking to pharmacy.  No fevers chills nausea vomiting syncope bleeding.  Riding horses again.  Denies any chest pain.  Doing very well.  No shortness of breath.  Minimal when walking up hills  Past Medical History:  Diagnosis Date  . Allergy   . Arthritis   . Cancer ([place]) 03/2016   prostate and bone , chemo completed  . Coronary artery disease   . GERD (gastroesophageal reflux disease)   . Hyperlipidemia   . Myocardial infarction ([place]) 1989    Past Surgical History:  Procedure Laterality Date  . COLONOSCOPY  2006  . CORONARY ANGIOPLASTY WITH STENT PLACEMENT    . CORONARY ARTERY BYPASS GRAFT N/A 10/03/2017   Procedure: CORONARY  ARTERY BYPASS GRAFTING (CABG) x 4, USING LEFT INTERNAL MAMMARY ARTERY AND RIGHT GREATER SAPHENOUS VEIN HARVESTED ENDOSCOPICALLY. LIMA TO LAD, SVG TO DIAG, SVG TO OM, SVG TO PDA.;  Surgeon: Gaye Pollack, MD;  Location: [facility] OR;  Service: Open Heart Surgery;  Laterality: N/A;  . HEMORRHOID SURGERY    . LEFT HEART CATH AND CORONARY ANGIOGRAPHY N/A 10/01/2017   Procedure: LEFT HEART CATH AND CORONARY ANGIOGRAPHY;  Surgeon: Martinique, Peter M, MD;  Location: [facility];  Service: Cardiovascular;  Laterality: N/A;  . ORIF SHOULDER FRACTURE     74 years old  . PROSTATE BIOPSY  03/2016  . TEE WITHOUT CARDIOVERSION N/A 10/03/2017   Procedure: TRANSESOPHAGEAL ECHOCARDIOGRAM (TEE);  Surgeon: Gaye Pollack, MD;  Location: [facility];  Service: Open Heart Surgery;  Laterality: N/A;  . TONSILLECTOMY AND ADENOIDECTOMY     73 years old    Current Medications: Current Meds  Medication Sig  . acetaminophen (TYLENOL) 325 MG tablet Take 2 tablets (650 mg total) by mouth every 6 (six) hours as needed for mild pain.  Marland Kitchen aspirin EC 81 MG tablet Take 81 mg by mouth daily.  . Calcium Carb-Cholecalciferol (CALCIUM 600+D) 600-800 MG-UNIT TABS Take 1 tablet by mouth daily.   . Evolocumab (REPATHA SURECLICK) XX123456 MG/ML SOAJ Inject 1 pen into the skin every 14 (fourteen) days.  Marland Kitchen  fexofenadine (ALLEGRA) 180 MG tablet Take 180 mg by mouth daily.  Marland Kitchen glucosamine-chondroitin 500-400 MG tablet Take 1 tablet by mouth daily.   . metoprolol tartrate (LOPRESSOR) 25 MG tablet Take 0.5 tablets (12.5 mg total) by mouth 2 (two) times daily.  . Multiple Vitamin (MULTI-VITAMINS) TABS Take by mouth.  . nitroGLYCERIN (NITROSTAT) 0.4 MG SL tablet Place 0.4 mg under the tongue as needed.  Marland Kitchen omeprazole (PRILOSEC) 20 MG capsule Take 20 mg by mouth daily before breakfast.     Allergies:   Statins   Social History   Socioeconomic History  . Marital status: Widowed    Spouse name: Not on file  . Number of children: Not on file  . Years of  education: Not on file  . Highest education level: High school graduate  Occupational History  . Not on file  Social Needs  . Financial resource strain: Not hard at all  . Food insecurity    Worry: Never true    Inability: Never true  . Transportation needs    Medical: No    Non-medical: No  Tobacco Use  . Smoking status: Former Smoker    Quit date: 08/01/1986    Years since quitting: 32.2  . Smokeless tobacco: Never Used  Substance and Sexual Activity  . Alcohol use: Yes    Alcohol/week: 4.0 standard drinks    Types: 4 Cans of beer per week  . Drug use: Never  . Sexual activity: Not on file  Lifestyle  . Physical activity    Days per week: 4 days    Minutes per session: 30 min  . Stress: Not at all  Relationships  . Social Herbalist on phone: Not on file    Gets together: Not on file    Attends religious service: Not on file    Active member of club or organization: Not on file    Attends meetings of clubs or organizations: Not on file    Relationship status: Not on file  Other Topics Concern  . Not on file  Social History Narrative  . Not on file     Family History: The patient's family history includes Breast cancer in his mother; Colon cancer (age of onset: 17) in his father; Prostate cancer in his father. There is no history of Stomach cancer, Esophageal cancer, or Rectal cancer.  ROS:   Please see the history of present illness.     All other systems reviewed and are negative.  EKGs/Labs/Other Studies Reviewed:    The following studies were reviewed today: Bypass note, EKG, lab work, prior office notes  EKG: 12/20/2017- sinus rhythm T wave versions V2 through V3, poor R wave progression personally reviewed-- sinus rhythm, T wave inversions V2 through 4 sinus rhythm T wave inversion noted in V2 V3 V4.  No significant change from prior EKG personally reviewed and interpreted.  Recent Labs: 09/16/2018: ALT 24; BUN 19; Creatinine 0.92; Hemoglobin  12.6; Platelet Count 261; Potassium 4.7; Sodium 142  Recent Lipid Panel    Component Value Date/Time   CHOL 130 12/16/2017 0806   TRIG 172 (H) 12/16/2017 0806   HDL 39 (L) 12/16/2017 0806   CHOLHDL 3.3 12/16/2017 0806   LDLCALC 57 12/16/2017 0806    Physical Exam:    VS:  BP 130/60   Pulse 61   Ht 5\' 10"  (1.778 m)   Wt 188 lb (85.3 kg)   SpO2 98%   BMI 26.98 kg/m  Wt Readings from Last 3 Encounters:  10/14/18 188 lb (85.3 kg)  09/18/18 186 lb 8 oz (84.6 kg)  05/15/18 184 lb 12.8 oz (83.8 kg)     GEN: Well nourished, well developed, in no acute distress  HEENT: normal  Neck: no JVD, carotid bruits, or masses Cardiac: RRR; no murmurs, rubs, or gallops,no edema CABG scar Respiratory:  clear to auscultation bilaterally, normal work of breathing GI: soft, nontender, nondistended, + BS MS: no deformity or atrophy  Skin: warm and dry, no rash Neuro:  Alert and Oriented x 3, Strength and sensation are intact Psych: euthymic mood, full affect   ASSESSMENT:    1. S/P CABG x 4   2. Ischemic heart disease   3. Bilateral carotid artery stenosis   4. Mixed hyperlipidemia    PLAN:    In order of problems listed above:  CAD post CABG 10/03/17 x 4 -Doing very well.  Cardiac rehab.  Continue with aggressive secondary prevention.  Aspirin.  He is taking 2 of the 81 mg aspirins for his osteoarthritis, nodes noted in distal joints.  Hyperlipidemia - Has had trouble with statin intolerance, Crestor.  Repatha.  Will be following in our lipid clinic.  LDL 57 November 2019.  Doing very well.  He is asking about coupon card since he is in the donut hole.  Essential hypertension - Doing very well.  No changes made.  Medications reviewed.  Excellent.  Walking 6 miles a day.  Bilateral carotid artery stenosis - Mild less than 39%.  Consider repeating study in about 2 years.  Continue with aggressive risk factor modification.  1 year follow-up.  Medication Adjustments/Labs and  Tests Ordered: Current medicines are reviewed at length with the patient today.  Concerns regarding medicines are outlined above.  No orders of the defined types were placed in this encounter.  No orders of the defined types were placed in this encounter.   Patient Instructions  Medication Instructions:  The current medical regimen is effective;  continue present plan and medications.  If you need a refill on your cardiac medications before your next appointment, please call your pharmacy.   Follow-Up: At [facility], you and your health needs are our priority.  As part of our continuing mission to provide you with exceptional heart care, we have created designated Provider Care Teams.  These Care Teams include your primary Cardiologist (physician) and Advanced Practice Providers (APPs -  Physician Assistants and Nurse Practitioners) who all work together to provide you with the care you need, when you need it. You will need a follow up appointment in 12 months.  Please call our office 2 months in advance to schedule this appointment.  You may see Candee Furbish, MD or one of the following Advanced Practice Providers on your designated Care Team:   Truitt Merle, NP Cecilie Kicks, NP . Kathyrn Drown, NP  Thank you for choosing [facility]!!        Signed, Candee Furbish, MD  10/14/2018 9:32 AM    [place]

## 2018-11-01 ENCOUNTER — Other Ambulatory Visit: Payer: Self-pay | Admitting: Cardiology

## 2018-11-23 ENCOUNTER — Other Ambulatory Visit: Payer: Self-pay | Admitting: Cardiology

## 2018-11-27 ENCOUNTER — Telehealth: Payer: Self-pay | Admitting: Oncology

## 2018-11-27 NOTE — Telephone Encounter (Signed)
Scheduled appt per 10/26 sch message and staff message from [facility] - unable to reach pt . Left message with appt date and time

## 2018-12-01 ENCOUNTER — Other Ambulatory Visit: Payer: Self-pay | Admitting: Cardiology

## 2019-01-20 ENCOUNTER — Other Ambulatory Visit: Payer: Self-pay

## 2019-01-20 ENCOUNTER — Inpatient Hospital Stay: Payer: Medicare Other

## 2019-01-20 ENCOUNTER — Inpatient Hospital Stay (HOSPITAL_BASED_OUTPATIENT_CLINIC_OR_DEPARTMENT_OTHER): Payer: Medicare Other | Admitting: Oncology

## 2019-01-20 ENCOUNTER — Inpatient Hospital Stay: Payer: Medicare Other | Attending: Oncology

## 2019-01-20 VITALS — BP 128/61 | HR 57 | Temp 98.0°F | Resp 18 | Ht 70.0 in | Wt 191.6 lb

## 2019-01-20 DIAGNOSIS — C61 Malignant neoplasm of prostate: Secondary | ICD-10-CM

## 2019-01-20 DIAGNOSIS — Z79899 Other long term (current) drug therapy: Secondary | ICD-10-CM | POA: Diagnosis not present

## 2019-01-20 LAB — CMP (CANCER CENTER ONLY)
ALT: 20 U/L (ref 0–44)
AST: 19 U/L (ref 15–41)
Albumin: 4.1 g/dL (ref 3.5–5.0)
Alkaline Phosphatase: 70 U/L (ref 38–126)
Anion gap: 11 (ref 5–15)
BUN: 14 mg/dL (ref 8–23)
CO2: 25 mmol/L (ref 22–32)
Calcium: 8.9 mg/dL (ref 8.9–10.3)
Chloride: 106 mmol/L (ref 98–111)
Creatinine: 0.91 mg/dL (ref 0.61–1.24)
GFR, Est AFR Am: >60 mL/min (ref >60)
GFR, Estimated: >60 mL/min (ref >60)
Glucose, Bld: 135 mg/dL — ABNORMAL HIGH (ref 70–99)
Potassium: 4.3 mmol/L (ref 3.5–5.1)
Sodium: 142 mmol/L (ref 135–145)
Total Bilirubin: 0.3 mg/dL (ref 0.3–1.2)
Total Protein: 7.2 g/dL (ref 6.5–8.1)

## 2019-01-20 LAB — CBC WITH DIFFERENTIAL (CANCER CENTER ONLY)
Abs Immature Granulocytes: 0.01 10*3/uL (ref 0.00–0.07)
Basophils Absolute: 0 10*3/uL (ref 0.0–0.1)
Basophils Relative: 1 %
Eosinophils Absolute: 0.2 10*3/uL (ref 0.0–0.5)
Eosinophils Relative: 3 %
HCT: 36.8 % — ABNORMAL LOW (ref 39.0–52.0)
Hemoglobin: 12.1 g/dL — ABNORMAL LOW (ref 13.0–17.0)
Immature Granulocytes: 0 %
Lymphocytes Relative: 33 %
Lymphs Abs: 2.4 10*3/uL (ref 0.7–4.0)
MCH: 28.9 pg (ref 26.0–34.0)
MCHC: 32.9 g/dL (ref 30.0–36.0)
MCV: 87.8 fL (ref 80.0–100.0)
Monocytes Absolute: 0.7 10*3/uL (ref 0.1–1.0)
Monocytes Relative: 9 %
Neutro Abs: 3.9 10*3/uL (ref 1.7–7.7)
Neutrophils Relative %: 54 %
Platelet Count: 255 10*3/uL (ref 150–400)
RBC: 4.19 MIL/uL — ABNORMAL LOW (ref 4.22–5.81)
RDW: 12.8 % (ref 11.5–15.5)
WBC Count: 7.2 10*3/uL (ref 4.0–10.5)
nRBC: 0 % (ref 0.0–0.2)

## 2019-01-20 MED ORDER — LEUPROLIDE ACETATE (4 MONTH) 30 MG ~~LOC~~ KIT
30.0000 mg | PACK | Freq: Once | SUBCUTANEOUS | Status: AC
  Administered 2019-01-20: 30 mg via SUBCUTANEOUS
  Filled 2019-01-20: qty 30

## 2019-01-20 NOTE — Progress Notes (Signed)
Hematology and Oncology Follow Up Visit  Jeff Decker [id number] July 24, 1945 73 y.o. 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  He was found to have PSA of 262 and a Gleason score 4+5 = 9 in 2018.   Prior Therapy:  Taxotere chemotherapy at 75 mg/m cycle one given on 06/13/2016. He is S/P 6 cycles concluded on 09/25/2016.   Current therapy:  Lupron 30 mg every 4 months starting on 01/15/2018 at the cancer center.  He will receive Eligard on January 20, 2019 and repeated in 4 months.    Interim History: [name] is here for repeat evaluation.  Since the last visit, he reports no major changes in his health.  He continues to tolerate androgen deprivation without any concerns.  He denies any excessive fatigue, nausea or recent hospitalizations.  He denies any weight loss or changes in appetite.  He is eating well and has gained weight.  Denies any frequency or hesitancy.  He denies any hematuria or dysuria.  Patient denied any alteration mental status, neuropathy, confusion or dizziness.  Denies any headaches or lethargy.  Denies any night sweats, weight loss or changes in appetite.  Denied orthopnea, dyspnea on exertion or chest discomfort.  Denies shortness of breath, difficulty breathing hemoptysis or cough.  Denies any abdominal distention, nausea, early satiety or dyspepsia.  Denies any hematuria, frequency, dysuria or nocturia.  Denies any skin irritation, dryness or rash.  Denies any ecchymosis or petechiae.  Denies any lymphadenopathy or clotting.  Denies any heat or cold intolerance.  Denies any anxiety or depression.  Remaining review of system is negative.      .      Medications: Updated without any changes. Current Outpatient Medications  Medication Sig Dispense Refill  . acetaminophen  (TYLENOL) 325 MG tablet Take 2 tablets (650 mg total) by mouth every 6 (six) hours as needed for mild pain.    Marland Kitchen aspirin EC 81 MG tablet Take 81 mg by mouth daily.    . Calcium Carb-Cholecalciferol (CALCIUM 600+D) 600-800 MG-UNIT TABS Take 1 tablet by mouth daily.     . fexofenadine (ALLEGRA) 180 MG tablet Take 180 mg by mouth daily.    Marland Kitchen glucosamine-chondroitin 500-400 MG tablet Take 1 tablet by mouth daily.     . metoprolol tartrate (LOPRESSOR) 25 MG tablet TAKE 0.5 TABLETS (12.5 MG TOTAL) BY MOUTH 2 (TWO) TIMES DAILY. 90 tablet 3  . Multiple Vitamin (MULTI-VITAMINS) TABS Take by mouth.    . nitroGLYCERIN (NITROSTAT) 0.4 MG SL tablet PLACE 1 TABLET UNDER TONGUE EVERY 5 MINUTES AS NEEDED FOR CHEST PAIN 75 tablet 1  . omeprazole (PRILOSEC) 20 MG capsule Take 20 mg by mouth daily before breakfast.  1  . REPATHA SURECLICK XX123456 MG/ML SOAJ INJECT 1 PEN INTO THE SKIN EVERY 14 (FOURTEEN) DAYS. 2 pen 11   No current facility-administered medications for this visit.   Facility-Administered Medications Ordered in Other Visits  Medication Dose Route Frequency Provider Last Rate Last Admin  . Leuprolide Acetate (4 Month) (ELIGARD) injection 30 mg  30 mg Subcutaneous Once Wyatt Portela, MD         Allergies:  Allergies  Allergen Reactions  . Statins Other (See Comments)    Muscle pain Tried 3 different statins    Past Medical History, Surgical history, Social history, and Family History without any changes on review.   Physical Exam:  Blood pressure 128/61, pulse (!) 57, temperature 98 F (36.7 C), temperature source Temporal, resp. rate 18, height 5\' 10"  (1.778 m), weight 191 lb 9.6 oz (86.9 kg), SpO2 99 %.    ECOG: 0   General appearance: Alert, awake without any distress. Head: Atraumatic without abnormalities Oropharynx: Without any thrush or ulcers. Eyes: No scleral icterus. Lymph nodes: No lymphadenopathy noted in the cervical, supraclavicular, or axillary nodes Heart:regular  rate and rhythm, without any murmurs or gallops.   Lung: Clear to auscultation without any rhonchi, wheezes or dullness to percussion. Abdomin: Soft, nontender without any shifting dullness or ascites. Musculoskeletal: No clubbing or cyanosis. Neurological: No motor or sensory deficits. Skin: No rashes or lesions.       Lab Results: Lab Results  Component Value Date   WBC 7.2 01/20/2019   HGB 12.1 (L) 01/20/2019   HCT 36.8 (L) 01/20/2019   MCV 87.8 01/20/2019   PLT 255 01/20/2019     Chemistry      Component Value Date/Time   NA 142 09/16/2018 1117   NA 138 10/22/2017 1211   NA 139 11/20/2016 0800   K 4.7 09/16/2018 1117   K 4.2 11/20/2016 0800   CL 106 09/16/2018 1117   CO2 26 09/16/2018 1117   CO2 24 11/20/2016 0800   BUN 19 09/16/2018 1117   BUN 19 10/22/2017 1211   BUN 16.7 11/20/2016 0800   CREATININE 0.92 09/16/2018 1117   CREATININE 0.8 11/20/2016 0800      Component Value Date/Time   CALCIUM 9.5 09/16/2018 1117   CALCIUM 9.1 11/20/2016 0800   ALKPHOS 79 09/16/2018 1117   ALKPHOS 58 11/20/2016 0800   AST 23 09/16/2018 1117   AST 18 11/20/2016 0800   ALT 24 09/16/2018 1117   ALT 16 11/20/2016 0800   BILITOT 0.4 09/16/2018 1117   BILITOT 0.40 11/20/2016 0800     Results for Jeff "[name]" (MRN [id number]) as of 01/20/2019 14:20  Ref. Range 05/13/2018 07:50 09/16/2018 11:17  Prostate Specific Ag, Serum Latest Ref Range: 0.0 - 4.0 ng/mL <0.1 <0.1      Impression and Plan:  73 year old man with:  1.  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    He is status post therapy outlined above including chemotherapy with excellent PSA response currently on androgen deprivation therapy alone.  The natural course of this disease and the risk of developing castration resistant was discussed.  Potential treatment options including Nicki Reaper among others.  For the time being his PSA remains under  excellent control and will continue the same treatment at this time.  Complication associated with this therapy is different than chemotherapy.  With hypertension, edema or excessive fatigue.  2. Androgen deprivation therapy: Long-term complication associated with this therapy including weight gain, hot flashes among others.  He will receive Eligard today and repeated in 4 months.  3. Bone directed therapy: He has a risk of osteoporosis given his androgen deprivation therapy.  I recommended calcium and vitamin D supplements.  4.  Prognosis and goals of care: His disease is incurable although aggressive measures are warranted given his excellent performance status.   5. Follow-up: He will return in 4 months for repeat evaluation.  25  minutes was spent with the patient face-to-face today.  More than 50% of time was spent on updating his disease status, future treatment options as well as potential complications and future plan of care.   Zola Button, MD 12/22/20202:33 PM

## 2019-01-20 NOTE — Patient Instructions (Signed)
Leuprolide injection What is this medicine? LEUPROLIDE (loo PROE lide) is a man-made hormone. It is used to treat the symptoms of prostate cancer. This medicine may also be used to treat children with early onset of puberty. It may be used for other hormonal conditions. This medicine may be used for other purposes; ask your health care provider or pharmacist if you have questions. COMMON BRAND NAME(S): Lupron What should I tell my health care provider before I take this medicine? They need to know if you have any of these conditions:  diabetes  heart disease or previous heart attack  high blood pressure  high cholesterol  pain or difficulty passing urine  spinal cord metastasis  stroke  tobacco smoker  an unusual or allergic reaction to leuprolide, benzyl alcohol, other medicines, foods, dyes, or preservatives  pregnant or trying to get pregnant  breast-feeding How should I use this medicine? This medicine is for injection under the skin or into a muscle. You will be taught how to prepare and give this medicine. Use exactly as directed. Take your medicine at regular intervals. Do not take your medicine more often than directed. It is important that you put your used needles and syringes in a special sharps container. Do not put them in a trash can. If you do not have a sharps container, call your pharmacist or healthcare provider to get one. A special MedGuide will be given to you by the pharmacist with each prescription and refill. Be sure to read this information carefully each time. Talk to your pediatrician regarding the use of this medicine in children. While this medicine may be prescribed for children as young as 8 years for selected conditions, precautions do apply. Overdosage: If you think you have taken too much of this medicine contact a poison control center or emergency room at once. NOTE: This medicine is only for you. Do not share this medicine with others. What if  I miss a dose? If you miss a dose, take it as soon as you can. If it is almost time for your next dose, take only that dose. Do not take double or extra doses. What may interact with this medicine? Do not take this medicine with any of the following medications:  chasteberry This medicine may also interact with the following medications:  herbal or dietary supplements, like black cohosh or DHEA  male hormones, like estrogens or progestins and birth control pills, patches, rings, or injections  male hormones, like testosterone This list may not describe all possible interactions. Give your health care provider a list of all the medicines, herbs, non-prescription drugs, or dietary supplements you use. Also tell them if you smoke, drink alcohol, or use illegal drugs. Some items may interact with your medicine. What should I watch for while using this medicine? Visit your doctor or health care professional for regular checks on your progress. During the first week, your symptoms may get worse, but then will improve as you continue your treatment. You may get hot flashes, increased bone pain, increased difficulty passing urine, or an aggravation of nerve symptoms. Discuss these effects with your doctor or health care professional, some of them may improve with continued use of this medicine. Male patients may experience a menstrual cycle or spotting during the first 2 months of therapy with this medicine. If this continues, contact your doctor or health care professional. This medicine may increase blood sugar. Ask your healthcare provider if changes in diet or medicines are needed if   you have diabetes. What side effects may I notice from receiving this medicine? Side effects that you should report to your doctor or health care professional as soon as possible:  allergic reactions like skin rash, itching or hives, swelling of the face, lips, or tongue  breathing problems  chest  pain  depression or memory disorders  pain in your legs or groin  pain at site where injected  severe headache  signs and symptoms of high blood sugar such as being more thirsty or hungry or having to urinate more than normal. You may also feel very tired or have blurry vision  swelling of the feet and legs  visual changes  vomiting Side effects that usually do not require medical attention (report to your doctor or health care professional if they continue or are bothersome):  breast swelling or tenderness  decrease in sex drive or performance  diarrhea  hot flashes  loss of appetite  muscle, joint, or bone pains  nausea  redness or irritation at site where injected  skin problems or acne This list may not describe all possible side effects. Call your doctor for medical advice about side effects. You may report side effects to FDA at 1-800-FDA-1088. Where should I keep my medicine? Keep out of the reach of children. Store below 25 degrees C (77 degrees F). Do not freeze. Protect from light. Do not use if it is not clear or if there are particles present. Throw away any unused medicine after the expiration date. NOTE: This sheet is a summary. It may not cover all possible information. If you have questions about this medicine, talk to your doctor, pharmacist, or health care provider.  2020 [employer]vier/Gold Standard (2017-11-14 09:52:48)  

## 2019-01-21 ENCOUNTER — Telehealth: Payer: Self-pay

## 2019-01-21 ENCOUNTER — Telehealth: Payer: Self-pay | Admitting: Oncology

## 2019-01-21 LAB — PROSTATE-SPECIFIC AG, SERUM (LABCORP): Prostate Specific Ag, Serum: <0.1 ng/mL (ref 0.0–4.0)

## 2019-01-21 NOTE — Telephone Encounter (Signed)
Scheduled appt per 12/22 los.  Spoke with pt and he is aware of the appt date and time.

## 2019-01-21 NOTE — Telephone Encounter (Signed)
Called patient and made him aware of PSA results. He verbalized understanding.

## 2019-01-21 NOTE — Telephone Encounter (Signed)
-----   Message from Wyatt Portela, MD sent at 01/21/2019  8:43 AM EST ----- Please let him know his PSA is low

## 2019-02-10 ENCOUNTER — Ambulatory Visit: Payer: Medicare Other | Admitting: Nurse Practitioner

## 2019-05-21 ENCOUNTER — Inpatient Hospital Stay: Payer: Medicare Other | Admitting: Oncology

## 2019-05-21 ENCOUNTER — Inpatient Hospital Stay: Payer: Medicare Other

## 2019-05-21 ENCOUNTER — Inpatient Hospital Stay: Payer: Medicare Other | Attending: Oncology

## 2019-05-21 ENCOUNTER — Other Ambulatory Visit: Payer: Self-pay

## 2019-05-21 VITALS — BP 142/78 | HR 60 | Temp 97.8°F | Resp 17 | Ht 70.0 in | Wt 185.0 lb

## 2019-05-21 DIAGNOSIS — Z79899 Other long term (current) drug therapy: Secondary | ICD-10-CM | POA: Insufficient documentation

## 2019-05-21 DIAGNOSIS — C61 Malignant neoplasm of prostate: Secondary | ICD-10-CM | POA: Diagnosis not present

## 2019-05-21 LAB — CMP (CANCER CENTER ONLY)
ALT: 22 U/L (ref 0–44)
AST: 22 U/L (ref 15–41)
Albumin: 4.3 g/dL (ref 3.5–5.0)
Alkaline Phosphatase: 72 U/L (ref 38–126)
Anion gap: 6 (ref 5–15)
BUN: 14 mg/dL (ref 8–23)
CO2: 25 mmol/L (ref 22–32)
Calcium: 9.4 mg/dL (ref 8.9–10.3)
Chloride: 107 mmol/L (ref 98–111)
Creatinine: 0.78 mg/dL (ref 0.61–1.24)
GFR, Est AFR Am: >60 mL/min (ref >60)
GFR, Estimated: >60 mL/min (ref >60)
Glucose, Bld: 109 mg/dL — ABNORMAL HIGH (ref 70–99)
Potassium: 4.1 mmol/L (ref 3.5–5.1)
Sodium: 138 mmol/L (ref 135–145)
Total Bilirubin: 0.5 mg/dL (ref 0.3–1.2)
Total Protein: 7.6 g/dL (ref 6.5–8.1)

## 2019-05-21 LAB — CBC WITH DIFFERENTIAL (CANCER CENTER ONLY)
Abs Immature Granulocytes: 0.01 10*3/uL (ref 0.00–0.07)
Basophils Absolute: 0.1 10*3/uL (ref 0.0–0.1)
Basophils Relative: 1 %
Eosinophils Absolute: 0.2 10*3/uL (ref 0.0–0.5)
Eosinophils Relative: 4 %
HCT: 37.9 % — ABNORMAL LOW (ref 39.0–52.0)
Hemoglobin: 12.6 g/dL — ABNORMAL LOW (ref 13.0–17.0)
Immature Granulocytes: 0 %
Lymphocytes Relative: 31 %
Lymphs Abs: 1.7 10*3/uL (ref 0.7–4.0)
MCH: 29 pg (ref 26.0–34.0)
MCHC: 33.2 g/dL (ref 30.0–36.0)
MCV: 87.3 fL (ref 80.0–100.0)
Monocytes Absolute: 0.5 10*3/uL (ref 0.1–1.0)
Monocytes Relative: 9 %
Neutro Abs: 3.1 10*3/uL (ref 1.7–7.7)
Neutrophils Relative %: 55 %
Platelet Count: 247 10*3/uL (ref 150–400)
RBC: 4.34 MIL/uL (ref 4.22–5.81)
RDW: 12.9 % (ref 11.5–15.5)
WBC Count: 5.6 10*3/uL (ref 4.0–10.5)
nRBC: 0 % (ref 0.0–0.2)

## 2019-05-21 MED ORDER — LEUPROLIDE ACETATE (4 MONTH) 30 MG ~~LOC~~ KIT
30.0000 mg | PACK | Freq: Once | SUBCUTANEOUS | Status: AC
  Administered 2019-05-21: 30 mg via SUBCUTANEOUS
  Filled 2019-05-21: qty 30

## 2019-05-21 NOTE — Progress Notes (Signed)
Hematology and Oncology Follow Up Visit  Jeff Decker [id number] 1945-08-13 74 y.o. 05/21/2019 8:55 AM [facility], [facility] [place]   Principle Diagnosis: 74 year old man with advanced prostate cancer with lymphadenopathy noted in 2018.  He presented with PSA of 262 and a Gleason score 4+5 = 9 and castration-sensitive disease.   Prior Therapy:  Taxotere chemotherapy at 75 mg/m cycle one given on 06/13/2016. He is S/P 6 cycles concluded on 09/25/2016.   Current therapy:  Lupron 30 mg every 4 months starting on 01/15/2018 at the cancer center.  He will receive Eligard on January 20, 2019 and repeated in 4 months.    Interim History: Jeff Decker is here for a current evaluation.  Since the last visit, he reports no major changes in his health.  He had no difficulties with Eligard since the last injection.  He denies any hot flashes or weight gain.  Continues to be active and attends activities of daily living.  Denies any bone pain or pathological fractures.  He denies any recent hospitalization or illnesses.     .      Medications: Reviewed without any changes. Current Outpatient Medications  Medication Sig Dispense Refill  . acetaminophen (TYLENOL) 325 MG tablet Take 2 tablets (650 mg total) by mouth every 6 (six) hours as needed for mild pain.    Marland Kitchen aspirin EC 81 MG tablet Take 81 mg by mouth daily.    . Calcium Carb-Cholecalciferol (CALCIUM 600+D) 600-800 MG-UNIT TABS Take 1 tablet by mouth daily.     . fexofenadine (ALLEGRA) 180 MG tablet Take 180 mg by mouth daily.    Marland Kitchen glucosamine-chondroitin 500-400 MG tablet Take 1 tablet by mouth daily.     . metoprolol tartrate (LOPRESSOR) 25 MG tablet TAKE 0.5 TABLETS (12.5 MG TOTAL) BY MOUTH 2 (TWO) TIMES DAILY. 90 tablet 3  . Multiple Vitamin (MULTI-VITAMINS) TABS Take by mouth.    . nitroGLYCERIN (NITROSTAT) 0.4 MG SL tablet PLACE 1 TABLET UNDER TONGUE EVERY 5 MINUTES AS NEEDED FOR CHEST  PAIN 75 tablet 1  . omeprazole (PRILOSEC) 20 MG capsule Take 20 mg by mouth daily before breakfast.  1  . REPATHA SURECLICK [id number] MG/ML SOAJ INJECT 1 PEN INTO THE SKIN EVERY 14 (FOURTEEN) DAYS. 2 pen 11   No current facility-administered medications for this visit.     Allergies:  Allergies  Allergen Reactions  . Statins Other (See Comments)    Muscle pain Tried 3 different statins       Physical Exam:  Blood pressure (!) 142/78, pulse 60, temperature 97.8 F (36.6 C), temperature source Oral, resp. rate 17, height 5\' 10"  (1.778 m), weight 185 lb (83.9 kg), SpO2 99 %.     ECOG: 0    General appearance: Comfortable appearing without any discomfort Head: Normocephalic without any trauma Oropharynx: Mucous membranes are moist and pink without any thrush or ulcers. Eyes: Pupils are equal and round reactive to light. Lymph nodes: No cervical, supraclavicular, inguinal or axillary lymphadenopathy.   Heart:regular rate and rhythm.  S1 and S2 without leg edema. Lung: Clear without any rhonchi or wheezes.  No dullness to percussion. Abdomin: Soft, nontender, nondistended with good bowel sounds.  No hepatosplenomegaly. Musculoskeletal: No joint deformity or effusion.  Full range of motion noted. Neurological: No deficits noted on motor, sensory and deep tendon reflex exam. Skin: No petechial rash or dryness.  Appeared moist.         Lab Results: Lab Results  Component Value Date  WBC 7.2 01/20/2019   HGB 12.1 (L) 01/20/2019   HCT 36.8 (L) 01/20/2019   MCV 87.8 01/20/2019   PLT 255 01/20/2019     Chemistry      Component Value Date/Time   NA 142 01/20/2019 1406   NA 138 10/22/2017 1211   NA 139 11/20/2016 0800   K 4.3 01/20/2019 1406   K 4.2 11/20/2016 0800   CL 106 01/20/2019 1406   CO2 25 01/20/2019 1406   CO2 24 11/20/2016 0800   BUN 14 01/20/2019 1406   BUN 19 10/22/2017 1211   BUN 16.7 11/20/2016 0800   CREATININE 0.91 01/20/2019 1406   CREATININE 0.8  11/20/2016 0800      Component Value Date/Time   CALCIUM 8.9 01/20/2019 1406   CALCIUM 9.1 11/20/2016 0800   ALKPHOS 70 01/20/2019 1406   ALKPHOS 58 11/20/2016 0800   AST 19 01/20/2019 1406   AST 18 11/20/2016 0800   ALT 20 01/20/2019 1406   ALT 16 11/20/2016 0800   BILITOT 0.3 01/20/2019 1406   BILITOT 0.40 11/20/2016 0800     Results for [place] "[name]" (MRN [id number]) as of 05/21/2019 08:56  Ref. Range 01/20/2019 14:06  Prostate Specific Ag, Serum Latest Ref Range: 0.0 - 4.0 ng/mL <0.1       Impression and Plan:  74 year old man with:  1.  Advanced prostate cancer with lymphadenopathy on presentation in April 2018.  He has castration-sensitive disease.  The natural course of his disease was updated at this time with previous treatment history outlined.  His PSA continues to be undetectable on androgen deprivation therapy and previous chemotherapy.  The risk of transforming into a castration-resistant disease was reviewed and salvage options at that time were reiterated.  If his PSA starts to rise addition of Zytiga or Gillermina Phy will be considered.  He is agreeable to continue.  2. Androgen deprivation therapy: He continues to be on Eligard and will receive one today.  Long-term complications including hot flashes and weight gain were reviewed.  He is agreeable to continue.  3. Bone directed therapy: I recommended calcium and vitamin D supplements for the time being.  Bone directed therapy will be deferred.  4.  Prognosis and goals of care: Therapy remains palliative at this time although aggressive measures are warranted given his excellent performance status.    5. Follow-up: In 4 months for a follow-up evaluation and next injection.  30  minutes were dedicated to this visit. The time was spent on reviewing laboratory data, discussing treatment options, and answering questions regarding future plan.    Zola Button, MD 4/22/20218:55 AM

## 2019-05-21 NOTE — Patient Instructions (Signed)

## 2019-05-22 ENCOUNTER — Telehealth: Payer: Self-pay | Admitting: Oncology

## 2019-05-22 ENCOUNTER — Telehealth: Payer: Self-pay

## 2019-05-22 LAB — PROSTATE-SPECIFIC AG, SERUM (LABCORP): Prostate Specific Ag, Serum: <0.1 ng/mL (ref 0.0–4.0)

## 2019-05-22 NOTE — Telephone Encounter (Signed)
Scheduled per 04/22 los, patient has been called and notified.  

## 2019-05-22 NOTE — Telephone Encounter (Signed)
Called patient and made him aware of PSA result. He verbalized understanding.  °

## 2019-05-22 NOTE — Telephone Encounter (Signed)
-----   Message from Firas N Shadad, MD sent at 05/22/2019  8:23 AM EDT ----- Please let him know his PSA is still low 

## 2019-06-30 ENCOUNTER — Telehealth: Payer: Self-pay | Admitting: Cardiology

## 2019-06-30 NOTE — Telephone Encounter (Signed)
Pt calling today with c/o CP after activity this weekend. He states he went to the park on Saturday for a walk and had an episode of CP when he went to bed that night. He took one nitro tab and had relief. Sunday, he was outside playing with his grandchildren and he had another episode of CP which was relieved with a nitro. He reports his BP has been increasing over the last few days as well. Normally his AM SBP's are in the 120's. Over the last few days he has been in the 130's to 150's.  I have scheduled him to see Dr. Marlou Porch tomorrow on 6/2. He understands, in the meantime, if he experiences periods of CP not relieved by nitro, SOB, diaphoresis, left shoulder/arm pain, n/v, back pain, he should seek care at his closest ED. He agrees and had no additional needs at this time.

## 2019-06-30 NOTE — Telephone Encounter (Signed)
New message     Pt c/o of Chest Pain: STAT if CP now or developed within 24 hours  1. Are you having CP right now? No   2. Are you experiencing any other symptoms (ex. SOB, nausea, vomiting, sweating)? No   3. How long have you been experiencing CP? Per patient pain started on 06/26/19  4. Is your CP continuous or coming and going? Continuous   5. Have you taken Nitroglycerin?yes   ?

## 2019-07-01 ENCOUNTER — Other Ambulatory Visit: Payer: Self-pay

## 2019-07-01 ENCOUNTER — Encounter: Payer: Self-pay | Admitting: Cardiology

## 2019-07-01 ENCOUNTER — Ambulatory Visit: Payer: Medicare Other | Admitting: Cardiology

## 2019-07-01 ENCOUNTER — Encounter: Payer: Self-pay | Admitting: *Deleted

## 2019-07-01 VITALS — BP 126/70 | HR 54 | Ht 70.0 in | Wt 182.0 lb

## 2019-07-01 DIAGNOSIS — I6523 Occlusion and stenosis of bilateral carotid arteries: Secondary | ICD-10-CM

## 2019-07-01 DIAGNOSIS — I259 Chronic ischemic heart disease, unspecified: Secondary | ICD-10-CM | POA: Diagnosis not present

## 2019-07-01 DIAGNOSIS — R079 Chest pain, unspecified: Secondary | ICD-10-CM | POA: Diagnosis not present

## 2019-07-01 DIAGNOSIS — E782 Mixed hyperlipidemia: Secondary | ICD-10-CM | POA: Diagnosis not present

## 2019-07-01 DIAGNOSIS — Z951 Presence of aortocoronary bypass graft: Secondary | ICD-10-CM | POA: Diagnosis not present

## 2019-07-01 DIAGNOSIS — E785 Hyperlipidemia, unspecified: Secondary | ICD-10-CM

## 2019-07-01 LAB — LIPID PANEL
Chol/HDL Ratio: 3.2 ratio (ref 0.0–5.0)
Cholesterol, Total: 136 mg/dL (ref 100–199)
HDL: 42 mg/dL (ref >39)
LDL Chol Calc (NIH): 66 mg/dL (ref 0–99)
Triglycerides: 164 mg/dL — ABNORMAL HIGH (ref 0–149)
VLDL Cholesterol Cal: 28 mg/dL (ref 5–40)

## 2019-07-01 NOTE — Telephone Encounter (Signed)
Agree with plan, thanks for the update.   Candee Furbish, MD

## 2019-07-01 NOTE — Patient Instructions (Signed)
Medication Instructions:  The current medical regimen is effective;  continue present plan and medications.  *If you need a refill on your cardiac medications before your next appointment, please call your pharmacy*  Lab Work: Please have Lipid panel today. If you have labs (blood work) drawn today and your tests are completely normal, you will receive your results only by:  [place] (if you have MyChart) OR  A paper copy in the mail If you have any lab test that is abnormal or we need to change your treatment, we will call you to review the results.  Testing/Procedures: Your physician has requested that you have a lexiscan myoview. For further information please visit [email or web address]. Please follow instruction sheet, as given.  Follow-Up: At [facility], you and your health needs are our priority.  As part of our continuing mission to provide you with exceptional heart care, we have created designated Provider Care Teams.  These Care Teams include your primary Cardiologist (physician) and Advanced Practice Providers (APPs -  Physician Assistants and Nurse Practitioners) who all work together to provide you with the care you need, when you need it.  We recommend signing up for the patient portal called "MyChart".  Sign up information is provided on this After Visit Summary.  MyChart is used to connect with patients for Virtual Visits (Telemedicine).  Patients are able to view lab/test results, encounter notes, upcoming appointments, etc.  Non-urgent messages can be sent to your provider as well.   To learn more about what you can do with MyChart, go to [email or web address].    Your next appointment:   12 month(s)  The format for your next appointment:   In Person  Provider:   Candee Furbish, MD   Thank you for choosing [facility]!!

## 2019-07-01 NOTE — Progress Notes (Signed)
Cardiology Office Note:    Date:  07/01/2019   ID:  Jeff Decker, DOB 1945/03/14, MRN [id number]  PCP:  [place] [place] [facility]  Cardiologist:  Candee Furbish, MD  Electrophysiologist:  None   Referring MD: Josefa Half*     History of Present Illness:    Jeff Decker is a 74 y.o. male here for follow up of coronary artery disease status post CABG x4 LIMA to LAD, SVG to diagonal, SVG to distal left circumflex, SVG to PDA on 10/03/2017 by Dr. Cyndia Bent. EF of approximately 35 to 45% prior to CABG.  After CABG, EF was restored to low normal 50 to 55% .Riding horses again.  Over the past 4 to 5 days he has had some intermittent chest discomfort fleeting. Felt deep constant pain. NTG helped. Went away. Never had pain like that since surgery. Went to [place] Friday walked 5 miles, 4 hours with grand kids and daughter. That night had the pain. Sat did ok. Sun walked with horses and grand kids..that night had another CP, NTG 5 min gone. Stayed in left chest. Hard work since then, ok. Watch chest felt ok.   Past Medical History:  Diagnosis Date  . Allergy   . Arthritis   . Cancer ([place]) 03/2016   prostate and bone , chemo completed  . Coronary artery disease   . GERD (gastroesophageal reflux disease)   . Hyperlipidemia   . Myocardial infarction ([place]) 1989    Past Surgical History:  Procedure Laterality Date  . COLONOSCOPY  2006  . CORONARY ANGIOPLASTY WITH STENT PLACEMENT    . CORONARY ARTERY BYPASS GRAFT N/A 10/03/2017   Procedure: CORONARY ARTERY BYPASS GRAFTING (CABG) x 4, USING LEFT INTERNAL MAMMARY ARTERY AND RIGHT GREATER SAPHENOUS VEIN HARVESTED ENDOSCOPICALLY. LIMA TO LAD, SVG TO DIAG, SVG TO OM, SVG TO PDA.;  Surgeon: Gaye Pollack, MD;  Location: [facility] OR;  Service: Open Heart Surgery;  Laterality: N/A;  . HEMORRHOID SURGERY    . LEFT HEART CATH AND CORONARY ANGIOGRAPHY N/A 10/01/2017   Procedure: LEFT HEART CATH AND CORONARY ANGIOGRAPHY;  Surgeon:  Martinique, Peter M, MD;  Location: [place] [facility];  Service: Cardiovascular;  Laterality: N/A;  . ORIF SHOULDER FRACTURE     74 years old  . PROSTATE BIOPSY  03/2016  . TEE WITHOUT CARDIOVERSION N/A 10/03/2017   Procedure: TRANSESOPHAGEAL ECHOCARDIOGRAM (TEE);  Surgeon: Gaye Pollack, MD;  Location: [place];  Service: Open Heart Surgery;  Laterality: N/A;  . TONSILLECTOMY AND ADENOIDECTOMY     74 years old    Current Medications: Current Meds  Medication Sig  . acetaminophen (TYLENOL) 325 MG tablet Take 2 tablets (650 mg total) by mouth every 6 (six) hours as needed for mild pain.  Marland Kitchen aspirin EC 81 MG tablet Take 81 mg by mouth daily.  . Calcium Carb-Cholecalciferol (CALCIUM 600+D) 600-800 MG-UNIT TABS Take 1 tablet by mouth daily.   . fexofenadine (ALLEGRA) 180 MG tablet Take 180 mg by mouth daily.  Marland Kitchen glucosamine-chondroitin 500-400 MG tablet Take 1 tablet by mouth daily.   . metoprolol tartrate (LOPRESSOR) 25 MG tablet TAKE 0.5 TABLETS (12.5 MG TOTAL) BY MOUTH 2 (TWO) TIMES DAILY.  . Multiple Vitamin (MULTI-VITAMINS) TABS Take by mouth.  . nitroGLYCERIN (NITROSTAT) 0.4 MG SL tablet PLACE 1 TABLET UNDER TONGUE EVERY 5 MINUTES AS NEEDED FOR CHEST PAIN  . omeprazole (PRILOSEC) 20 MG capsule Take 20 mg by mouth daily before breakfast.  . REPATHA SURECLICK [id number] MG/ML  SOAJ INJECT 1 PEN INTO THE SKIN EVERY 14 (FOURTEEN) DAYS.     Allergies:   Statins   Social History   Socioeconomic History  . Marital status: Widowed    Spouse name: Not on file  . Number of children: Not on file  . Years of education: Not on file  . Highest education level: High school graduate  Occupational History  . Not on file  Tobacco Use  . Smoking status: Former Smoker    Quit date: 08/01/1986    Years since quitting: 32.9  . Smokeless tobacco: Never Used  Substance and Sexual Activity  . Alcohol use: Yes    Alcohol/week: 4.0 standard drinks    Types: 4 Cans of beer per week  . Drug use: Never  . Sexual  activity: Not on file  Other Topics Concern  . Not on file  Social History Narrative  . Not on file   Social Determinants of Health   Financial Resource Strain:   . Difficulty of Paying Living Expenses:   Food Insecurity:   . Worried About Charity fundraiser in the Last Year:   . Arboriculturist in the Last Year:   Transportation Needs:   . Film/video editor (Medical):   Marland Kitchen Lack of Transportation (Non-Medical):   Physical Activity:   . Days of Exercise per Week:   . Minutes of Exercise per Session:   Stress:   . Feeling of Stress :   Social Connections:   . Frequency of Communication with Friends and Family:   . Frequency of Social Gatherings with Friends and Family:   . Attends Religious Services:   . Active Member of Clubs or Organizations:   . Attends Archivist Meetings:   Marland Kitchen Marital Status:      Family History: The patient's family history includes Breast cancer in his mother; Colon cancer (age of onset: 5) in his father; Prostate cancer in his father. There is no history of Stomach cancer, Esophageal cancer, or Rectal cancer.  ROS:   Please see the history of present illness.    No syncope no fevers no chills all other systems reviewed and are negative.  EKGs/Labs/Other Studies Reviewed:    The following studies were reviewed today: Prior catheterization, operative report  EKG:  EKG is  ordered today.  The ekg ordered today demonstrates sinus bradycardia 54 with no other specific abnormalities, subtle J-point elevation noted inferior leads.  Recent Labs: 05/21/2019: ALT 22; BUN 14; Creatinine 0.78; Hemoglobin 12.6; Platelet Count 247; Potassium 4.1; Sodium 138  Recent Lipid Panel    Component Value Date/Time   CHOL 130 12/16/2017 0806   TRIG 172 (H) 12/16/2017 0806   HDL 39 (L) 12/16/2017 0806   CHOLHDL 3.3 12/16/2017 0806   LDLCALC 57 12/16/2017 0806    Physical Exam:    VS:  BP 126/70   Pulse (!) 54   Ht 5\' 10"  (1.778 m)   Wt 182 lb  (82.6 kg)   SpO2 98%   BMI 26.11 kg/m     Wt Readings from Last 3 Encounters:  07/01/19 182 lb (82.6 kg)  05/21/19 185 lb (83.9 kg)  01/20/19 191 lb 9.6 oz (86.9 kg)     GEN:  Well nourished, well developed in no acute distress HEENT: Normal NECK: No JVD; No carotid bruits LYMPHATICS: No lymphadenopathy CARDIAC: RRR, no murmurs, rubs, gallops RESPIRATORY:  Clear to auscultation without rales, wheezing or rhonchi  ABDOMEN: Soft, non-tender, non-distended MUSCULOSKELETAL:  No edema; No deformity  SKIN: Warm and dry NEUROLOGIC:  Alert and oriented x 3 PSYCHIATRIC:  Normal affect   ASSESSMENT:    1. Chest pain of uncertain etiology   2. Ischemic heart disease   3. S/P CABG x 4   4. Mixed hyperlipidemia   5. Hyperlipidemia, unspecified hyperlipidemia type   6. Bilateral carotid artery stenosis   7. Chest pain, unspecified type    PLAN:    In order of problems listed above:  CAD post CABG 10/03/17 x 4 -Doing very well up until recently having some intermittent chest discomfort.  It may make sense for [place] to go ahead and proceed with a Lexiscan stress test to make sure that there is no evidence of significant ischemia identified. Post Cardiac rehab.  Continue with aggressive secondary prevention.  Aspirin.  He is taking 2 of the 81 mg aspirins for his osteoarthritis, nodes noted in distal joints.  Hyperlipidemia - Has had trouble with statin intolerance, Crestor.  Repatha.    LDL 57 November 2019.  Doing very well.  We will go ahead and check a lipid panel.  He has been compliant with Repatha.  Essential hypertension - Doing very well.  No changes made.  Medications reviewed.  Excellent.  Walking 6 miles a day.  Showed me his blood pressure records.  Bilateral carotid artery stenosis - Mild less than 39%.  Consider repeating study in about 2 years.  Continue with aggressive risk factor modification.  Prostate cancer --Dr. Alen Blew.  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  He was found to have PSA of 262 and a Gleason score 4+5 = 9 in 2018.  He did mention erectile dysfunction.  Wonder if it would be okay for him to take sildenafil for instance.  It is okay from a cardiology perspective as long as he does not take nitroglycerin.  Medication Adjustments/Labs and Tests Ordered: Current medicines are reviewed at length with the patient today.  Concerns regarding medicines are outlined above.  Orders Placed This Encounter  Procedures  . Lipid panel  . MYOCARDIAL PERFUSION IMAGING  . EKG 12-Lead   No orders of the defined types were placed in this encounter.   Patient Instructions  Medication Instructions:  The current medical regimen is effective;  continue present plan and medications.  *If you need a refill on your cardiac medications before your next appointment, please call your pharmacy*  Lab Work: Please have Lipid panel today. If you have labs (blood work) drawn today and your tests are completely normal, you will receive your results only by: Marland Kitchen MyChart Message (if you have MyChart) OR . A paper copy in the mail If you have any lab test that is abnormal or we need to change your treatment, we will call you to review the results.  Testing/Procedures: Your physician has requested that you have a lexiscan myoview. For further information please visit [email or web address]. Please follow instruction sheet, as given.  Follow-Up: At [facility], you and your health needs are our priority.  As part of our continuing mission to provide you with exceptional heart care, we have created designated Provider Care Teams.  These Care Teams include your primary Cardiologist (physician) and Advanced Practice Providers (APPs -  Physician Assistants and Nurse Practitioners) who all work together to provide you with the care you need, when you need it.  We recommend signing up for the patient portal called "MyChart".   Sign up information is provided on  this After Visit Summary.  MyChart is used to connect with patients for Virtual Visits (Telemedicine).  Patients are able to view lab/test results, encounter notes, upcoming appointments, etc.  Non-urgent messages can be sent to your provider as well.   To learn more about what you can do with MyChart, go to [email or web address].    Your next appointment:   12 month(s)  The format for your next appointment:   In Person  Provider:   Candee Furbish, MD   Thank you for choosing [facility]!!        Signed, Candee Furbish, MD  07/01/2019 11:15 AM    [place]

## 2019-07-06 ENCOUNTER — Encounter: Payer: Self-pay | Admitting: *Deleted

## 2019-07-09 ENCOUNTER — Encounter (HOSPITAL_COMMUNITY): Payer: Medicare Other

## 2019-07-15 ENCOUNTER — Telehealth (HOSPITAL_COMMUNITY): Payer: Self-pay

## 2019-07-15 NOTE — Telephone Encounter (Signed)
Spoke with the patient, detailed instructions given. He stated that he would be here for his test. Asked to call back with any questions. Jeff Decker EMTP 

## 2019-07-21 ENCOUNTER — Ambulatory Visit (HOSPITAL_COMMUNITY): Payer: Medicare Other | Attending: Cardiology

## 2019-07-21 ENCOUNTER — Other Ambulatory Visit: Payer: Self-pay

## 2019-07-21 DIAGNOSIS — R079 Chest pain, unspecified: Secondary | ICD-10-CM | POA: Insufficient documentation

## 2019-07-21 LAB — MYOCARDIAL PERFUSION IMAGING
LV dias vol: 142 mL (ref 62–150)
LV sys vol: 81 mL
Peak HR: 78 {beats}/min
Rest HR: 48 {beats}/min
SDS: 8
SRS: 15
SSS: 22
TID: 1.04

## 2019-07-21 MED ORDER — TECHNETIUM TC 99M TETROFOSMIN IV KIT
10.8000 | PACK | Freq: Once | INTRAVENOUS | Status: AC | PRN
  Administered 2019-07-21: 10.8 via INTRAVENOUS
  Filled 2019-07-21: qty 11

## 2019-07-21 MED ORDER — REGADENOSON 0.4 MG/5ML IV SOLN
0.4000 mg | Freq: Once | INTRAVENOUS | Status: AC
  Administered 2019-07-21: 0.4 mg via INTRAVENOUS

## 2019-07-21 MED ORDER — TECHNETIUM TC 99M TETROFOSMIN IV KIT
31.9000 | PACK | Freq: Once | INTRAVENOUS | Status: AC | PRN
  Administered 2019-07-21: 31.9 via INTRAVENOUS
  Filled 2019-07-21: qty 32

## 2019-09-23 ENCOUNTER — Other Ambulatory Visit: Payer: Self-pay

## 2019-09-23 DIAGNOSIS — C61 Malignant neoplasm of prostate: Secondary | ICD-10-CM

## 2019-09-24 ENCOUNTER — Inpatient Hospital Stay (HOSPITAL_BASED_OUTPATIENT_CLINIC_OR_DEPARTMENT_OTHER): Payer: Medicare Other | Admitting: Oncology

## 2019-09-24 ENCOUNTER — Inpatient Hospital Stay: Payer: Medicare Other | Attending: Oncology

## 2019-09-24 ENCOUNTER — Other Ambulatory Visit: Payer: Self-pay

## 2019-09-24 ENCOUNTER — Inpatient Hospital Stay: Payer: Medicare Other

## 2019-09-24 VITALS — BP 118/67 | HR 53 | Temp 96.4°F | Resp 18 | Ht 70.0 in | Wt 187.2 lb

## 2019-09-24 DIAGNOSIS — Z79899 Other long term (current) drug therapy: Secondary | ICD-10-CM | POA: Diagnosis not present

## 2019-09-24 DIAGNOSIS — Z9221 Personal history of antineoplastic chemotherapy: Secondary | ICD-10-CM | POA: Diagnosis not present

## 2019-09-24 DIAGNOSIS — C61 Malignant neoplasm of prostate: Secondary | ICD-10-CM

## 2019-09-24 DIAGNOSIS — Z923 Personal history of irradiation: Secondary | ICD-10-CM | POA: Insufficient documentation

## 2019-09-24 LAB — CBC WITH DIFFERENTIAL (CANCER CENTER ONLY)
Abs Immature Granulocytes: 0.01 10*3/uL (ref 0.00–0.07)
Basophils Absolute: 0.1 10*3/uL (ref 0.0–0.1)
Basophils Relative: 1 %
Eosinophils Absolute: 0.3 10*3/uL (ref 0.0–0.5)
Eosinophils Relative: 5 %
HCT: 36 % — ABNORMAL LOW (ref 39.0–52.0)
Hemoglobin: 12.1 g/dL — ABNORMAL LOW (ref 13.0–17.0)
Immature Granulocytes: 0 %
Lymphocytes Relative: 34 %
Lymphs Abs: 2 10*3/uL (ref 0.7–4.0)
MCH: 29.2 pg (ref 26.0–34.0)
MCHC: 33.6 g/dL (ref 30.0–36.0)
MCV: 87 fL (ref 80.0–100.0)
Monocytes Absolute: 0.6 10*3/uL (ref 0.1–1.0)
Monocytes Relative: 11 %
Neutro Abs: 3 10*3/uL (ref 1.7–7.7)
Neutrophils Relative %: 49 %
Platelet Count: 246 10*3/uL (ref 150–400)
RBC: 4.14 MIL/uL — ABNORMAL LOW (ref 4.22–5.81)
RDW: 12.9 % (ref 11.5–15.5)
WBC Count: 6 10*3/uL (ref 4.0–10.5)
nRBC: 0 % (ref 0.0–0.2)

## 2019-09-24 LAB — CMP (CANCER CENTER ONLY)
ALT: 12 U/L (ref 0–44)
AST: 16 U/L (ref 15–41)
Albumin: 3.8 g/dL (ref 3.5–5.0)
Alkaline Phosphatase: 63 U/L (ref 38–126)
Anion gap: 4 — ABNORMAL LOW (ref 5–15)
BUN: 18 mg/dL (ref 8–23)
CO2: 27 mmol/L (ref 22–32)
Calcium: 9.3 mg/dL (ref 8.9–10.3)
Chloride: 107 mmol/L (ref 98–111)
Creatinine: 0.81 mg/dL (ref 0.61–1.24)
GFR, Est AFR Am: >60 mL/min (ref >60)
GFR, Estimated: >60 mL/min (ref >60)
Glucose, Bld: 100 mg/dL — ABNORMAL HIGH (ref 70–99)
Potassium: 4.1 mmol/L (ref 3.5–5.1)
Sodium: 138 mmol/L (ref 135–145)
Total Bilirubin: 0.4 mg/dL (ref 0.3–1.2)
Total Protein: 7 g/dL (ref 6.5–8.1)

## 2019-09-24 MED ORDER — LEUPROLIDE ACETATE (4 MONTH) 30 MG ~~LOC~~ KIT
30.0000 mg | PACK | Freq: Once | SUBCUTANEOUS | Status: AC
  Administered 2019-09-24: 30 mg via SUBCUTANEOUS

## 2019-09-24 MED ORDER — LEUPROLIDE ACETATE (4 MONTH) 30 MG ~~LOC~~ KIT
PACK | SUBCUTANEOUS | Status: AC
  Filled 2019-09-24: qty 30

## 2019-09-24 NOTE — Progress Notes (Signed)
Hematology and Oncology Follow Up Visit  Jeff Decker [id number] 18-Jan-1946 74 y.o. 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  He was found to have PSA of 262 and a Gleason score 4+5 = 9.    Prior Therapy:  Taxotere chemotherapy at 75 mg/m cycle one given on 06/13/2016. He is S/P 6 cycles concluded on 09/25/2016.   Current therapy: Eligard every 4 months.  He is scheduled to receive it today and repeated in December.      Interim History: Mr. Thieme presents today for a repeat evaluation.  Since the last visit, he reports no major changes in his health.  Continues to be active and attends to activities of daily living.  Denies any abdominal pain, fatigue or tiredness.  He denies any recent hospitalization or illnesses.  He denies any bone pain or pathological fractures.     .      Medications: Unchanged on review. Current Outpatient Medications  Medication Sig Dispense Refill  . acetaminophen (TYLENOL) 325 MG tablet Take 2 tablets (650 mg total) by mouth every 6 (six) hours as needed for mild pain.    Marland Kitchen aspirin EC 81 MG tablet Take 81 mg by mouth daily.    . Calcium Carb-Cholecalciferol (CALCIUM 600+D) 600-800 MG-UNIT TABS Take 1 tablet by mouth daily.     . fexofenadine (ALLEGRA) 180 MG tablet Take 180 mg by mouth daily.    Marland Kitchen glucosamine-chondroitin 500-400 MG tablet Take 1 tablet by mouth daily.     . metoprolol tartrate (LOPRESSOR) 25 MG tablet TAKE 0.5 TABLETS (12.5 MG TOTAL) BY MOUTH 2 (TWO) TIMES DAILY. 90 tablet 3  . Multiple Vitamin (MULTI-VITAMINS) TABS Take by mouth.    . nitroGLYCERIN (NITROSTAT) 0.4 MG SL tablet PLACE 1 TABLET UNDER TONGUE EVERY 5 MINUTES AS NEEDED FOR CHEST PAIN 75 tablet 1  . omeprazole (PRILOSEC) 20 MG capsule Take 20 mg by mouth daily before breakfast.  1  .  REPATHA SURECLICK 808 MG/ML SOAJ INJECT 1 PEN INTO THE SKIN EVERY 14 (FOURTEEN) DAYS. 2 pen 11   No current facility-administered medications for this visit.     Allergies:  Allergies  Allergen Reactions  . Statins Other (See Comments)    Muscle pain Tried 3 different statins       Physical Exam:   Blood pressure 118/67, pulse (!) 53, temperature (!) 96.4 F (35.8 C), temperature source Tympanic, resp. rate 18, height 5\' 10"  (1.778 m), weight 187 lb 3.2 oz (84.9 kg), SpO2 99 %.     ECOG: 0   General appearance: Alert, awake without any distress. Head: Atraumatic without abnormalities Oropharynx: Without any thrush or ulcers. Eyes: No scleral icterus. Lymph nodes: No lymphadenopathy noted in the cervical, supraclavicular, or axillary nodes Heart:regular rate and rhythm, without any murmurs or gallops.   Lung: Clear to auscultation without any rhonchi, wheezes or dullness to percussion. Abdomin: Soft, nontender without any shifting dullness or ascites. Musculoskeletal: No clubbing or cyanosis. Neurological: No motor or sensory deficits. Skin: No rashes or lesions.         Lab Results: Lab Results  Component Value Date   WBC 5.6 05/21/2019   HGB 12.6 (L) 05/21/2019   HCT 37.9 (L) 05/21/2019   MCV 87.3 05/21/2019   PLT 247 05/21/2019     Chemistry      Component Value Date/Time   NA 138  05/21/2019 0846   NA 138 10/22/2017 1211   NA 139 11/20/2016 0800   K 4.1 05/21/2019 0846   K 4.2 11/20/2016 0800   CL 107 05/21/2019 0846   CO2 25 05/21/2019 0846   CO2 24 11/20/2016 0800   BUN 14 05/21/2019 0846   BUN 19 10/22/2017 1211   BUN 16.7 11/20/2016 0800   CREATININE 0.78 05/21/2019 0846   CREATININE 0.8 11/20/2016 0800      Component Value Date/Time   CALCIUM 9.4 05/21/2019 0846   CALCIUM 9.1 11/20/2016 0800   ALKPHOS 72 05/21/2019 0846   ALKPHOS 58 11/20/2016 0800   AST 22 05/21/2019 0846   AST 18 11/20/2016 0800   ALT 22 05/21/2019 0846   ALT  16 11/20/2016 0800   BILITOT 0.5 05/21/2019 0846   BILITOT 0.40 11/20/2016 0800        Results for JEDIAH, HORGER [name] "[name]" (MRN [id number]) as of 09/24/2019 09:58  Ref. Range 01/20/2019 14:06 05/21/2019 08:46  Prostate Specific Ag, Serum Latest Ref Range: 0.0 - 4.0 ng/mL <0.1 <0.1     Impression and Plan:  74 year old man with:  1.  Castration-sensitive prostate cancer with lymphadenopathy diagnosed in April 2018.    He is status post Taxotere chemotherapy in addition to androgen deprivation with PSA currently undetectable.  Risks and benefits of continuing this approach were reviewed.  Additional therapy such as Nicki Reaper among others will be deferred unless he developed castration-resistant disease.  He understands this and agreeable to proceed with the current plan.  2. Androgen deprivation therapy: Long-term complication associated with Eligard were reviewed.  These complications include osteoporosis, hyperlipidemia, weight gain and sexual dysfunction.   3. Bone directed therapy: Calcium and vitamin D supplements recommended at this time.  4.  Prognosis and goals of care: His disease is incurable although aggressive measures are warranted given his excellent response and performance status.   5. Follow-up: In 4 months for repeat evaluation and next injection appointment.  30  minutes were spent on this encounter.  Time was dedicated to reviewing his laboratory data, disease status update and future treatment options for the future.    Zola Button, MD 8/26/20219:56 AM

## 2019-09-24 NOTE — Patient Instructions (Signed)

## 2019-09-25 ENCOUNTER — Telehealth: Payer: Self-pay

## 2019-09-25 LAB — PROSTATE-SPECIFIC AG, SERUM (LABCORP): Prostate Specific Ag, Serum: <0.1 ng/mL (ref 0.0–4.0)

## 2019-09-25 NOTE — Telephone Encounter (Signed)
-----   Message from Wyatt Portela, MD sent at 09/25/2019  8:19 AM EDT ----- Please let him know his PSA still low

## 2019-09-25 NOTE — Telephone Encounter (Signed)
Called patient and made him aware of his PSA result. Patient verbalized understanding.

## 2019-10-29 ENCOUNTER — Other Ambulatory Visit: Payer: Self-pay | Admitting: Cardiology

## 2019-11-25 ENCOUNTER — Other Ambulatory Visit: Payer: Self-pay | Admitting: Cardiology

## 2019-12-16 ENCOUNTER — Ambulatory Visit: Payer: Medicare Other | Admitting: Physician Assistant

## 2020-01-27 ENCOUNTER — Other Ambulatory Visit: Payer: Self-pay

## 2020-01-27 ENCOUNTER — Inpatient Hospital Stay: Payer: Medicare Other | Attending: Oncology

## 2020-01-27 ENCOUNTER — Inpatient Hospital Stay: Payer: Medicare Other

## 2020-01-27 ENCOUNTER — Inpatient Hospital Stay (HOSPITAL_BASED_OUTPATIENT_CLINIC_OR_DEPARTMENT_OTHER): Payer: Medicare Other | Admitting: Oncology

## 2020-01-27 VITALS — BP 110/64 | HR 60 | Temp 97.8°F | Resp 20 | Ht 70.0 in | Wt 186.5 lb

## 2020-01-27 VITALS — BP 128/64 | HR 55 | Temp 98.3°F | Resp 18

## 2020-01-27 DIAGNOSIS — Z7982 Long term (current) use of aspirin: Secondary | ICD-10-CM | POA: Insufficient documentation

## 2020-01-27 DIAGNOSIS — Z5111 Encounter for antineoplastic chemotherapy: Secondary | ICD-10-CM | POA: Insufficient documentation

## 2020-01-27 DIAGNOSIS — C61 Malignant neoplasm of prostate: Secondary | ICD-10-CM | POA: Diagnosis not present

## 2020-01-27 DIAGNOSIS — Z79899 Other long term (current) drug therapy: Secondary | ICD-10-CM | POA: Insufficient documentation

## 2020-01-27 DIAGNOSIS — Z9221 Personal history of antineoplastic chemotherapy: Secondary | ICD-10-CM | POA: Diagnosis not present

## 2020-01-27 DIAGNOSIS — C7951 Secondary malignant neoplasm of bone: Secondary | ICD-10-CM | POA: Diagnosis present

## 2020-01-27 LAB — CBC WITH DIFFERENTIAL (CANCER CENTER ONLY)
Abs Immature Granulocytes: 0.02 10*3/uL (ref 0.00–0.07)
Basophils Absolute: 0 10*3/uL (ref 0.0–0.1)
Basophils Relative: 1 %
Eosinophils Absolute: 0.1 10*3/uL (ref 0.0–0.5)
Eosinophils Relative: 2 %
HCT: 39.4 % (ref 39.0–52.0)
Hemoglobin: 13.1 g/dL (ref 13.0–17.0)
Immature Granulocytes: 0 %
Lymphocytes Relative: 34 %
Lymphs Abs: 1.9 10*3/uL (ref 0.7–4.0)
MCH: 28.7 pg (ref 26.0–34.0)
MCHC: 33.2 g/dL (ref 30.0–36.0)
MCV: 86.2 fL (ref 80.0–100.0)
Monocytes Absolute: 0.5 10*3/uL (ref 0.1–1.0)
Monocytes Relative: 9 %
Neutro Abs: 3 10*3/uL (ref 1.7–7.7)
Neutrophils Relative %: 54 %
Platelet Count: 251 10*3/uL (ref 150–400)
RBC: 4.57 MIL/uL (ref 4.22–5.81)
RDW: 12.6 % (ref 11.5–15.5)
WBC Count: 5.6 10*3/uL (ref 4.0–10.5)
nRBC: 0 % (ref 0.0–0.2)

## 2020-01-27 LAB — CMP (CANCER CENTER ONLY)
ALT: 27 U/L (ref 0–44)
AST: 21 U/L (ref 15–41)
Albumin: 4.2 g/dL (ref 3.5–5.0)
Alkaline Phosphatase: 61 U/L (ref 38–126)
Anion gap: 5 (ref 5–15)
BUN: 18 mg/dL (ref 8–23)
CO2: 27 mmol/L (ref 22–32)
Calcium: 9.3 mg/dL (ref 8.9–10.3)
Chloride: 108 mmol/L (ref 98–111)
Creatinine: 0.81 mg/dL (ref 0.61–1.24)
GFR, Estimated: >60 mL/min (ref >60)
Glucose, Bld: 120 mg/dL — ABNORMAL HIGH (ref 70–99)
Potassium: 4.2 mmol/L (ref 3.5–5.1)
Sodium: 140 mmol/L (ref 135–145)
Total Bilirubin: 0.6 mg/dL (ref 0.3–1.2)
Total Protein: 7.6 g/dL (ref 6.5–8.1)

## 2020-01-27 MED ORDER — LEUPROLIDE ACETATE (4 MONTH) 30 MG ~~LOC~~ KIT
30.0000 mg | PACK | Freq: Once | SUBCUTANEOUS | Status: AC
  Administered 2020-01-27: 30 mg via SUBCUTANEOUS

## 2020-01-27 MED ORDER — LEUPROLIDE ACETATE (4 MONTH) 30 MG ~~LOC~~ KIT
PACK | SUBCUTANEOUS | Status: AC
  Filled 2020-01-27: qty 30

## 2020-01-27 NOTE — Progress Notes (Signed)
Hematology and Oncology Follow Up Visit  Jeff Decker [id number] [birth year] 74 y.o. 01/27/2020 9:33 AM Jeff Decker, [facility], [place]*   Principle Diagnosis: 74 year old man with advanced prostate cancer with lymphadenopathy diagnosed in 2018.  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   Prior Therapy:  Taxotere chemotherapy at 75 mg/m cycle one given on 06/13/2016. He is S/P 6 cycles concluded on 09/25/2016.   Current therapy: Eligard every 4 months.  He will receive an injection today and repeated in April.      Interim History: Jeff Decker returns today for a follow-up visit.  Since the last visit, he reports no major changes or complaints.  He denies any worsening bone pain or pathological fractures.  He denies any recent hospitalization or illnesses.  His performance status and quality of life remains excellent.  He denies any nausea or vomiting.  He denies any decline in his energy or performance status.     .      Medications: Reviewed without changes. Current Outpatient Medications  Medication Sig Dispense Refill  . acetaminophen (TYLENOL) 325 MG tablet Take 2 tablets (650 mg total) by mouth every 6 (six) hours as needed for mild pain.    Marland Kitchen aspirin EC 81 MG tablet Take 81 mg by mouth daily.    . Calcium Carb-Cholecalciferol (CALCIUM 600+D) 600-800 MG-UNIT TABS Take 1 tablet by mouth daily.     . fexofenadine (ALLEGRA) 180 MG tablet Take 180 mg by mouth daily.    Marland Kitchen glucosamine-chondroitin 500-400 MG tablet Take 1 tablet by mouth daily.     . metoprolol tartrate (LOPRESSOR) 25 MG tablet TAKE 1/2 TABLET BY MOUTH 2 TIMES DAILY. 90 tablet 2  . Multiple Vitamin (MULTI-VITAMINS) TABS Take by mouth.    . nitroGLYCERIN (NITROSTAT) 0.4 MG SL tablet PLACE 1 TABLET UNDER TONGUE EVERY 5 MINUTES AS NEEDED FOR CHEST PAIN 75 tablet 1  . omeprazole (PRILOSEC) 20 MG  capsule Take 20 mg by mouth daily before breakfast.  1  . REPATHA SURECLICK XX123456 MG/ML SOAJ INJECT 1 PEN INTO THE SKIN EVERY 14 (FOURTEEN) DAYS. 2 mL 11   No current facility-administered medications for this visit.     Allergies:  Allergies  Allergen Reactions  . Statins Other (See Comments)    Muscle pain Tried 3 different statins       Physical Exam:   Blood pressure 110/64, pulse 60, temperature 97.8 F (36.6 C), temperature source Tympanic, resp. rate 20, height 5\' 10"  (1.778 m), weight 186 lb 8 oz (84.6 kg), SpO2 99 %.      ECOG: 0    General appearance: Comfortable appearing without any discomfort Head: Normocephalic without any trauma Oropharynx: Mucous membranes are moist and pink without any thrush or ulcers. Eyes: Pupils are equal and round reactive to light. Lymph nodes: No cervical, supraclavicular, inguinal or axillary lymphadenopathy.   Heart:regular rate and rhythm.  S1 and S2 without leg edema. Lung: Clear without any rhonchi or wheezes.  No dullness to percussion. Abdomin: Soft, nontender, nondistended with good bowel sounds.  No hepatosplenomegaly. Musculoskeletal: No joint deformity or effusion.  Full range of motion noted. Neurological: No deficits noted on motor, sensory and deep tendon reflex exam. Skin: No petechial rash or dryness.  Appeared moist.           Lab Results: Lab Results  Component Value Date   WBC 6.0 09/24/2019   HGB  12.1 (L) 09/24/2019   HCT 36.0 (L) 09/24/2019   MCV 87.0 09/24/2019   PLT 246 09/24/2019     Chemistry      Component Value Date/Time   NA 138 09/24/2019 0945   NA 138 10/22/2017 1211   NA 139 11/20/2016 0800   K 4.1 09/24/2019 0945   K 4.2 11/20/2016 0800   CL 107 09/24/2019 0945   CO2 27 09/24/2019 0945   CO2 24 11/20/2016 0800   BUN 18 09/24/2019 0945   BUN 19 10/22/2017 1211   BUN 16.7 11/20/2016 0800   CREATININE 0.81 09/24/2019 0945   CREATININE 0.8 11/20/2016 0800      Component  Value Date/Time   CALCIUM 9.3 09/24/2019 0945   CALCIUM 9.1 11/20/2016 0800   ALKPHOS 63 09/24/2019 0945   ALKPHOS 58 11/20/2016 0800   AST 16 09/24/2019 0945   AST 18 11/20/2016 0800   ALT 12 09/24/2019 0945   ALT 16 11/20/2016 0800   BILITOT 0.4 09/24/2019 0945   BILITOT 0.40 11/20/2016 0800        Results for Jeff Decker Jeff "Jeff" (MRN [id number]) as of 01/27/2020 09:35  Ref. Range 05/21/2019 08:46 09/24/2019 09:45  Prostate Specific Ag, Serum Latest Ref Range: 0.0 - 4.0 ng/mL <0.1 <0.1      Impression and Plan:  74 year old man with:  1.  Advanced prostate cancer diagnosed in 2018.  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  He continues to be on androgen deprivation therapy after completing chemotherapy without any complaints.  His PSA continues to be undetectable at this time.  The natural course of this disease and future treatment options were discussed.  These will be deferred unless he developed castration-resistant disease.  His options including standing on Zytiga and others.  I recommended updating imaging studies staging before the next visit.  He is agreeable with this plan.  2. Androgen deprivation therapy: Risks and benefits of continuing Eligard long-term were reviewed.  Complications include weight gain, hot flashes were discussed.   3. Bone directed therapy: I recommended calcium and vitamin D supplements for the time being.  4.  Prognosis and goals of care: Therapy remains palliative although aggressive measures are warranted given his excellent performance status and response.   5. Follow-up: He will return in 4 months for repeat evaluation  30  minutes were dedicated to this visit.  Time spent on reviewing his laboratory data, disease status update as well as outlining future plan of care.    Jeff Hose, MD 12/29/20219:33 AM

## 2020-01-27 NOTE — Patient Instructions (Addendum)

## 2020-01-28 ENCOUNTER — Telehealth: Payer: Self-pay

## 2020-01-28 ENCOUNTER — Telehealth: Payer: Self-pay | Admitting: Oncology

## 2020-01-28 LAB — PROSTATE-SPECIFIC AG, SERUM (LABCORP): Prostate Specific Ag, Serum: <0.1 ng/mL (ref 0.0–4.0)

## 2020-01-28 IMAGING — CT CT ABDOMEN AND PELVIS WITH CONTRAST
2 of 5 series · 15 of 46 positions shown, 17 images · IV contrast (OMNIPAQUE)
Comparison: CT the abdomen and pelvis 11/20/2016.

CLINICAL DATA: 72-year-old male with history of advanced prostate
cancer with abdominal adenopathy diagnosed in April 2016. Follow-up
study.

EXAM:
CT ABDOMEN AND PELVIS WITH CONTRAST
TECHNIQUE: Multidetector CT imaging of the abdomen and pelvis was performed
using the standard protocol following bolus administration of
intravenous contrast.
CONTRAST:  100mL OMNIPAQUE IOHEXOL 300 MG/ML  SOLN

[Series 2: axial st · axial · 0.84mm/px · z∈[+1022,+1437]mm · 12 of 95 slices shown, 14 images]
[im 6/95  soft-tissue]
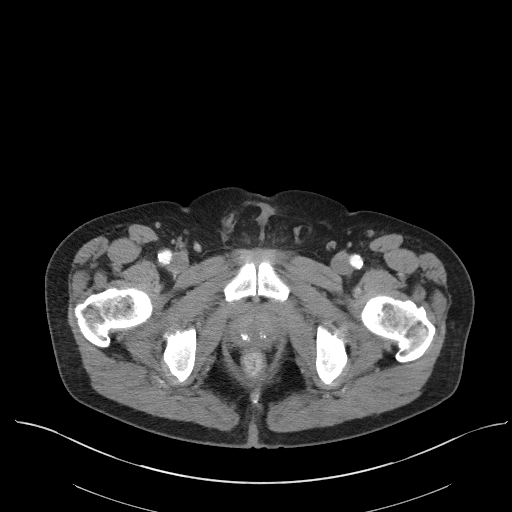
[im 6/95  bone]
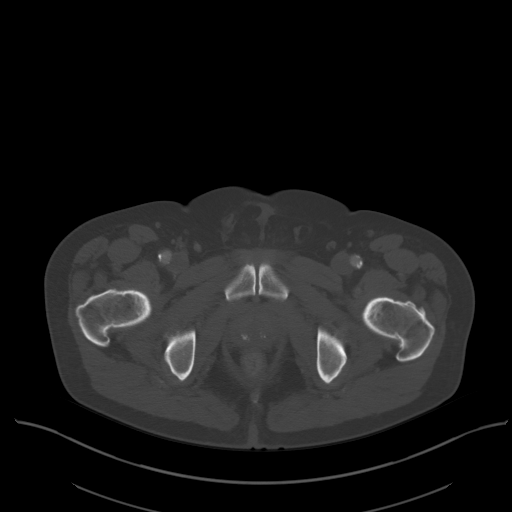
[im 12/95  soft-tissue]
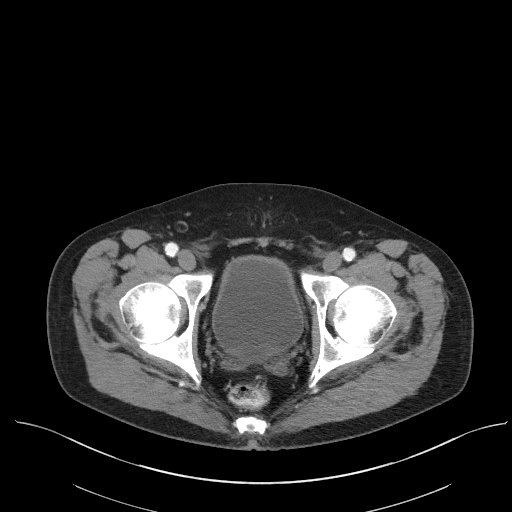
[im 24/95  soft-tissue]
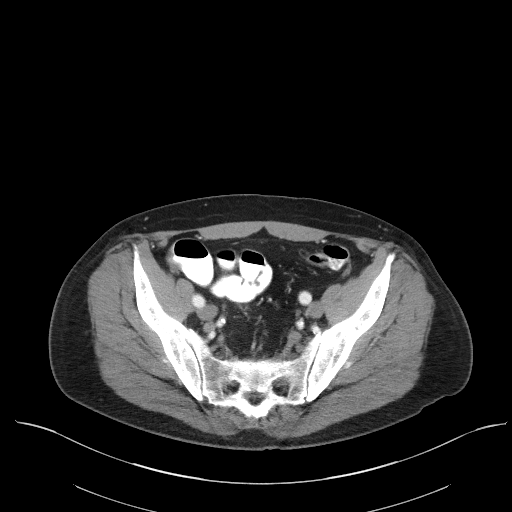
[im 30/95  soft-tissue]
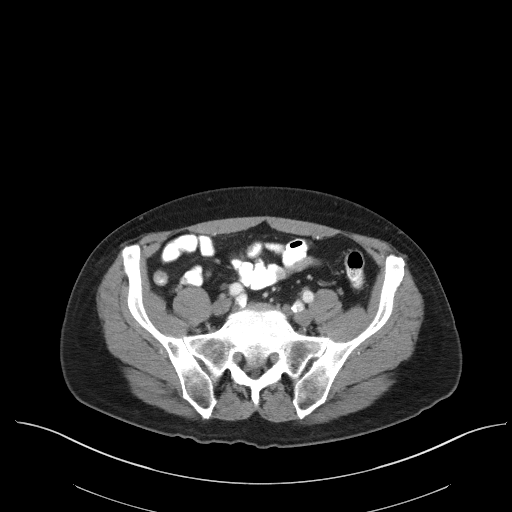
[im 36/95  soft-tissue]
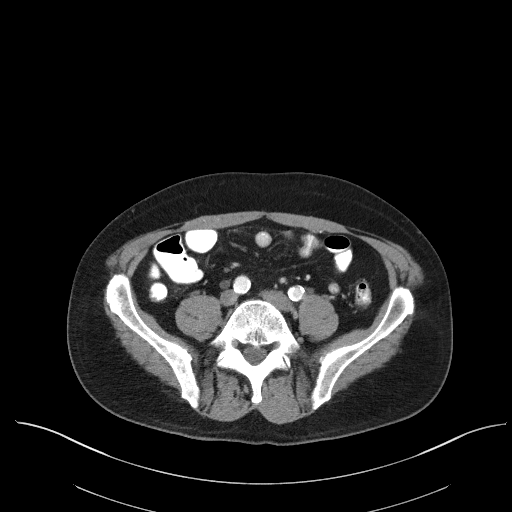
[im 42/95  soft-tissue]
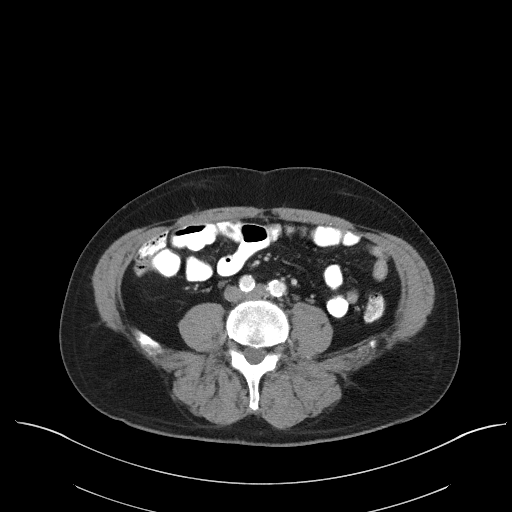
[im 53/95  soft-tissue]
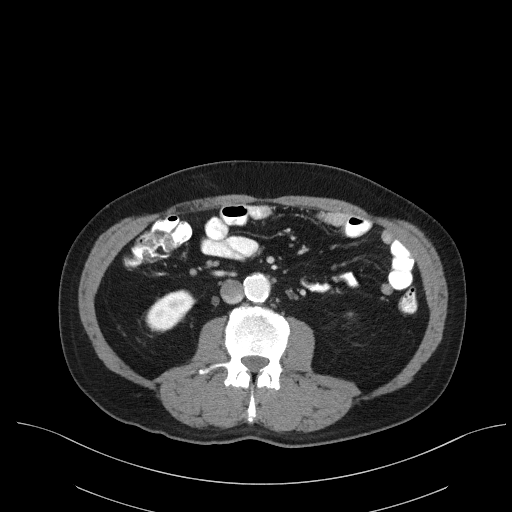
[im 59/95  soft-tissue]
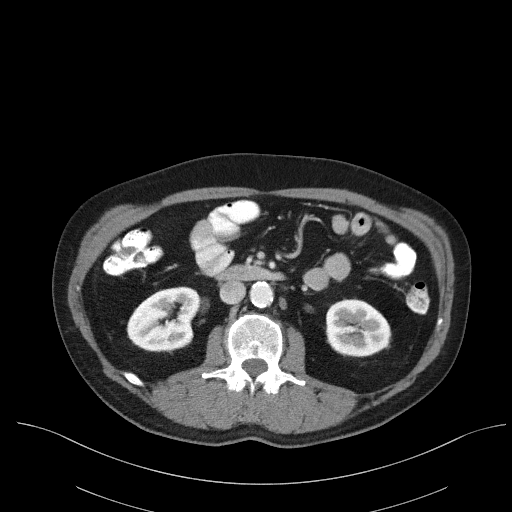
[im 65/95  soft-tissue]
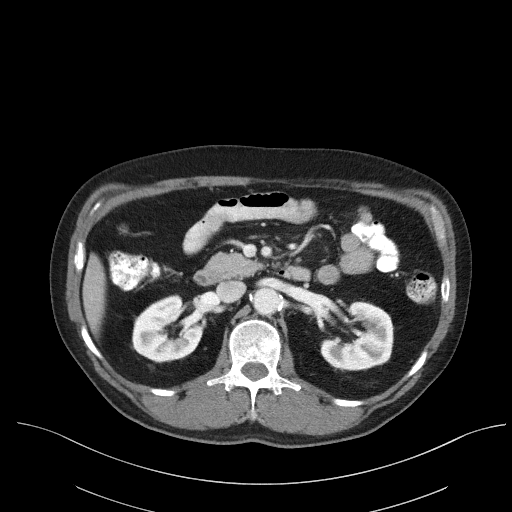
[im 65/95  bone]
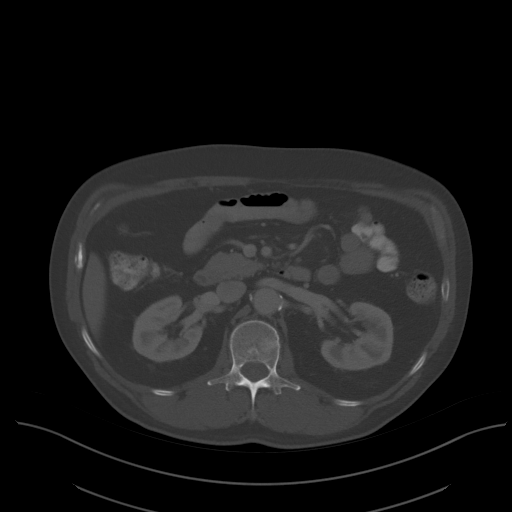
[im 71/95  soft-tissue]
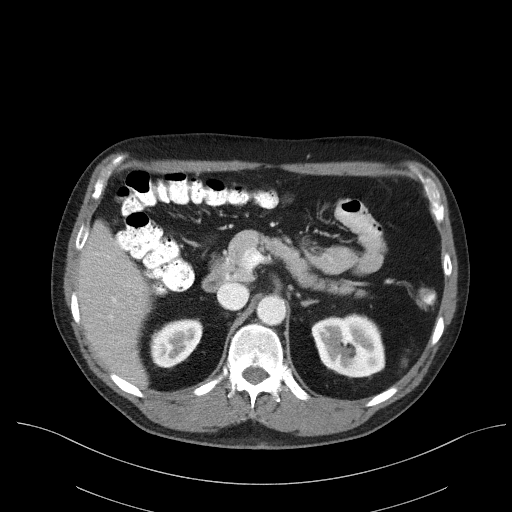
[im 83/95  soft-tissue]
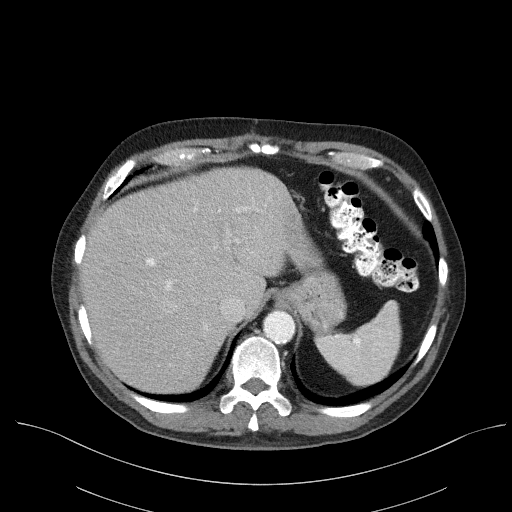
[im 89/95  soft-tissue]
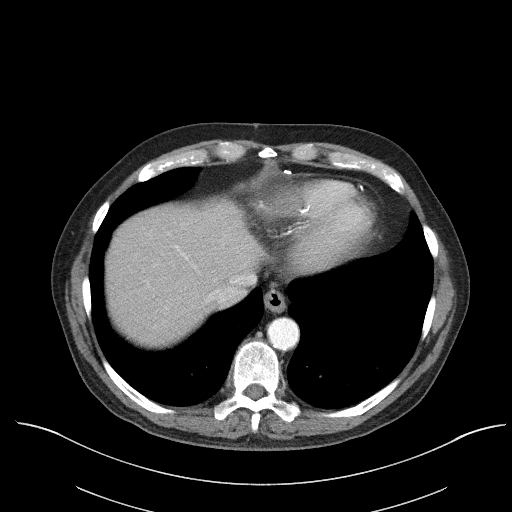

[Series 4: coronal st · coronal · 0.85mm/px · 3 of 87 slices shown]
[im 29/87  soft-tissue]
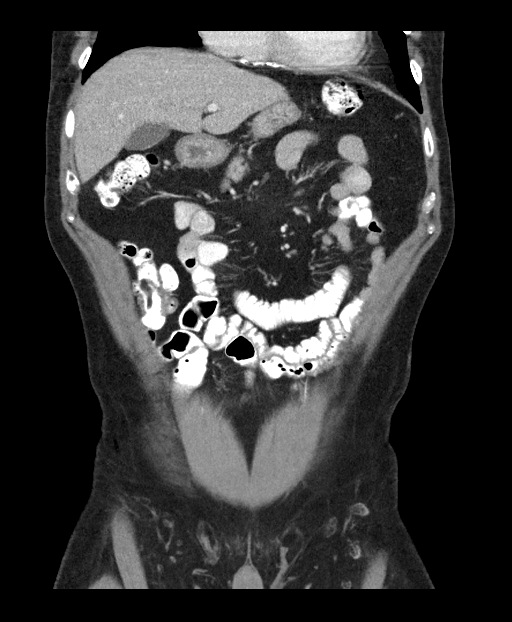
[im 39/87  soft-tissue]
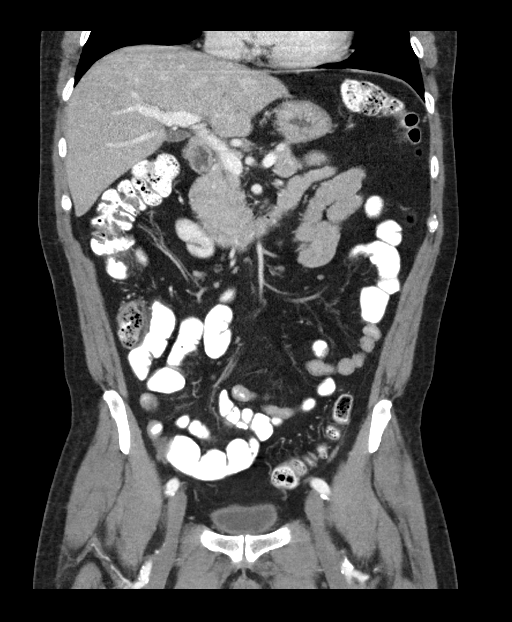
[im 48/87  soft-tissue]
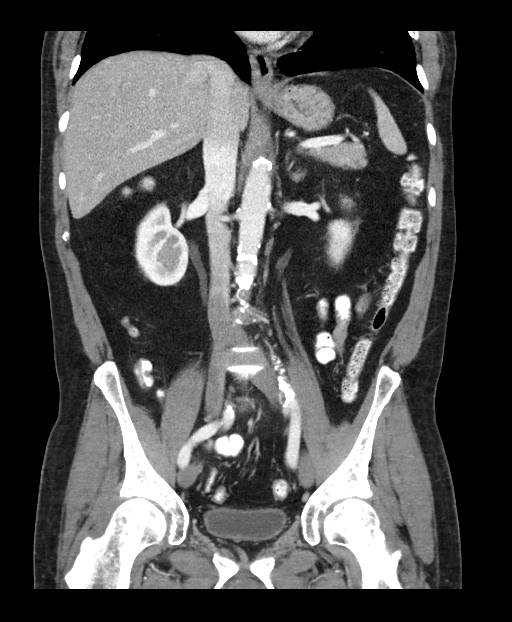

[15 of 46 positions shown; findings below may reference images not displayed]

FINDINGS: Lower chest: Atherosclerotic calcifications in the left anterior
descending, left circumflex and right coronary arteries. Myocardial
thinning and subendocardial hypoattenuation throughout the left
ventricular apically myocardium, likely to reflect fibrofatty
metaplasia and scarring from prior distal LAD territory myocardial
infarction(s).

Hepatobiliary: No suspicious cystic or solid hepatic lesions. No
intra or extrahepatic biliary ductal dilatation. Gallbladder is
normal in appearance.

Pancreas: No pancreatic mass. No pancreatic ductal dilatation. No
pancreatic or peripancreatic fluid or inflammatory changes.

Spleen: Unremarkable.

Adrenals/Urinary Tract: Bilateral kidneys and adrenal glands are
normal in appearance. No hydroureteronephrosis. Urinary bladder is
normal in appearance.

Stomach/Bowel: Normal appearance of the stomach. No pathologic
dilatation of small bowel or colon. Normal appendix.

Vascular/Lymphatic: Aortic atherosclerosis with fusiform ectasia of
the infrarenal abdominal aorta which measures up to 2.7 x 2.4 cm. No
aneurysm or dissection noted in the abdominal or pelvic vasculature.
No lymphadenopathy noted in the abdomen or pelvis.

Reproductive: Prostate gland is grossly unremarkable in appearance.

Other: No significant volume of ascites.  No pneumoperitoneum.

Musculoskeletal: Decreased size of previously noted right iliac
sclerotic lesion which currently measures 1.5 cm (axial image 67 of
series 2), decreased from 2.1 cm on prior study 11/20/2016.
Additionally, the previously noted sclerotic lesion in L5 appears
slightly smaller, currently measuring 1.3 cm (axial image 58 of
series 2) as compared to 1.7 cm on the prior examination. Other
sclerotic osseous lesions are also less apparent when compared to
the prior examination.
IMPRESSION: 1. Improving sclerotic lesions throughout the visualized skeleton,
suggesting a positive response to therapy.
2. No new extra skeletal metastatic disease noted elsewhere in the
abdomen or pelvis.
3. Aortic atherosclerosis, in addition to at least 3 vessel coronary
artery disease with evidence of prior distal LAD territory
myocardial infarction(s), as above.
4. Additional incidental findings, as above.

## 2020-01-28 NOTE — Telephone Encounter (Signed)
Scheduled follow-up appointments per 12/29 los. Patient is aware. 

## 2020-01-28 NOTE — Telephone Encounter (Signed)
-----   Message from Benjiman Core, MD sent at 01/28/2020  8:23 AM EST ----- Please let him know his PSA is still low

## 2020-01-28 NOTE — Telephone Encounter (Signed)
Called patient and let him know his PSA is still low. Patient verbalized understanding.  

## 2020-05-20 ENCOUNTER — Inpatient Hospital Stay: Payer: Medicare Other

## 2020-05-24 ENCOUNTER — Inpatient Hospital Stay: Payer: Medicare Other | Attending: Oncology

## 2020-05-24 ENCOUNTER — Other Ambulatory Visit: Payer: Self-pay

## 2020-05-24 ENCOUNTER — Ambulatory Visit (HOSPITAL_COMMUNITY)
Admission: RE | Admit: 2020-05-24 | Discharge: 2020-05-24 | Disposition: A | Discharge status: Home / Self Care | Payer: Medicare Other | Source: Ambulatory Visit | Attending: Oncology | Admitting: Oncology

## 2020-05-24 ENCOUNTER — Encounter (HOSPITAL_COMMUNITY): Payer: Self-pay

## 2020-05-24 DIAGNOSIS — Z7982 Long term (current) use of aspirin: Secondary | ICD-10-CM | POA: Diagnosis not present

## 2020-05-24 DIAGNOSIS — Z79899 Other long term (current) drug therapy: Secondary | ICD-10-CM | POA: Diagnosis not present

## 2020-05-24 DIAGNOSIS — C61 Malignant neoplasm of prostate: Secondary | ICD-10-CM | POA: Diagnosis not present

## 2020-05-24 DIAGNOSIS — Z191 Hormone sensitive malignancy status: Secondary | ICD-10-CM | POA: Diagnosis not present

## 2020-05-24 DIAGNOSIS — C7951 Secondary malignant neoplasm of bone: Secondary | ICD-10-CM | POA: Diagnosis not present

## 2020-05-24 DIAGNOSIS — Z79818 Long term (current) use of other agents affecting estrogen receptors and estrogen levels: Secondary | ICD-10-CM | POA: Diagnosis not present

## 2020-05-24 LAB — CMP (CANCER CENTER ONLY)
ALT: 22 U/L (ref 0–44)
AST: 21 U/L (ref 15–41)
Albumin: 4 g/dL (ref 3.5–5.0)
Alkaline Phosphatase: 55 U/L (ref 38–126)
Anion gap: 8 (ref 5–15)
BUN: 10 mg/dL (ref 8–23)
CO2: 26 mmol/L (ref 22–32)
Calcium: 9 mg/dL (ref 8.9–10.3)
Chloride: 108 mmol/L (ref 98–111)
Creatinine: 0.77 mg/dL (ref 0.61–1.24)
GFR, Estimated: >60 mL/min (ref >60)
Glucose, Bld: 101 mg/dL — ABNORMAL HIGH (ref 70–99)
Potassium: 4.1 mmol/L (ref 3.5–5.1)
Sodium: 142 mmol/L (ref 135–145)
Total Bilirubin: 0.5 mg/dL (ref 0.3–1.2)
Total Protein: 7 g/dL (ref 6.5–8.1)

## 2020-05-24 LAB — CBC WITH DIFFERENTIAL (CANCER CENTER ONLY)
Abs Immature Granulocytes: 0.01 10*3/uL (ref 0.00–0.07)
Basophils Absolute: 0.1 10*3/uL (ref 0.0–0.1)
Basophils Relative: 1 %
Eosinophils Absolute: 0.4 10*3/uL (ref 0.0–0.5)
Eosinophils Relative: 7 %
HCT: 34.9 % — ABNORMAL LOW (ref 39.0–52.0)
Hemoglobin: 11.7 g/dL — ABNORMAL LOW (ref 13.0–17.0)
Immature Granulocytes: 0 %
Lymphocytes Relative: 39 %
Lymphs Abs: 1.8 10*3/uL (ref 0.7–4.0)
MCH: 29.5 pg (ref 26.0–34.0)
MCHC: 33.5 g/dL (ref 30.0–36.0)
MCV: 88.1 fL (ref 80.0–100.0)
Monocytes Absolute: 0.5 10*3/uL (ref 0.1–1.0)
Monocytes Relative: 11 %
Neutro Abs: 2 10*3/uL (ref 1.7–7.7)
Neutrophils Relative %: 42 %
Platelet Count: 226 10*3/uL (ref 150–400)
RBC: 3.96 MIL/uL — ABNORMAL LOW (ref 4.22–5.81)
RDW: 12.5 % (ref 11.5–15.5)
WBC Count: 4.8 10*3/uL (ref 4.0–10.5)
nRBC: 0 % (ref 0.0–0.2)

## 2020-05-24 MED ORDER — IOHEXOL 9 MG/ML PO SOLN
ORAL | Status: AC
  Filled 2020-05-24: qty 1000

## 2020-05-24 MED ORDER — TECHNETIUM TC 99M MEDRONATE IV KIT
21.5000 | PACK | Freq: Once | INTRAVENOUS | Status: AC
  Administered 2020-05-24: 21.5 via INTRAVENOUS

## 2020-05-24 MED ORDER — IOHEXOL 9 MG/ML PO SOLN
1000.0000 mL | ORAL | Status: AC
  Administered 2020-05-24: 1000 mL via ORAL

## 2020-05-24 MED ORDER — IOHEXOL 300 MG/ML  SOLN
100.0000 mL | Freq: Once | INTRAMUSCULAR | Status: AC | PRN
  Administered 2020-05-24: 100 mL via INTRAVENOUS

## 2020-05-25 ENCOUNTER — Inpatient Hospital Stay (HOSPITAL_BASED_OUTPATIENT_CLINIC_OR_DEPARTMENT_OTHER): Payer: Medicare Other | Admitting: Oncology

## 2020-05-25 ENCOUNTER — Inpatient Hospital Stay: Payer: Medicare Other

## 2020-05-25 VITALS — BP 135/57 | HR 50 | Temp 97.9°F | Resp 19 | Ht 70.0 in | Wt 180.6 lb

## 2020-05-25 DIAGNOSIS — C61 Malignant neoplasm of prostate: Secondary | ICD-10-CM | POA: Diagnosis not present

## 2020-05-25 LAB — PROSTATE-SPECIFIC AG, SERUM (LABCORP): Prostate Specific Ag, Serum: 0.1 ng/mL (ref 0.0–4.0)

## 2020-05-25 MED ORDER — LEUPROLIDE ACETATE (4 MONTH) 30 MG ~~LOC~~ KIT
PACK | SUBCUTANEOUS | Status: AC
  Filled 2020-05-25: qty 30

## 2020-05-25 MED ORDER — LEUPROLIDE ACETATE (4 MONTH) 30 MG ~~LOC~~ KIT
30.0000 mg | PACK | Freq: Once | SUBCUTANEOUS | Status: AC
  Administered 2020-05-25: 30 mg via SUBCUTANEOUS

## 2020-05-25 NOTE — Progress Notes (Signed)
Hematology and Oncology Follow Up Visit  Jeff Decker [id number] 24-Aug-1945 75 y.o. 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  He he was found to have PSA of 262 and a Gleason score 4+5 = 9.    Prior Therapy:  Taxotere chemotherapy at 75 mg/m cycle one given on 06/13/2016. He is S/P 6 cycles concluded on 09/25/2016.   Current therapy: Eligard every 4 months.  He will receive it today and repeated in 4 months.      Interim History: Jeff Decker returns today for a follow-up visit.  Since the last visit, he reports no major changes in his health.  He continues to be active and attends activities of daily living.  He denies any recent hospitalizations or illnesses.  He denies any nausea, vomiting or bone pain.  He denies any pathological fractures.     .      Medications: Updated on review. Current Outpatient Medications  Medication Sig Dispense Refill  . acetaminophen (TYLENOL) 325 MG tablet Take 2 tablets (650 mg total) by mouth every 6 (six) hours as needed for mild pain.    Marland Kitchen aspirin EC 81 MG tablet Take 81 mg by mouth daily.    . Calcium Carb-Cholecalciferol (CALCIUM 600+D) 600-800 MG-UNIT TABS Take 1 tablet by mouth daily.     . fexofenadine (ALLEGRA) 180 MG tablet Take 180 mg by mouth daily.    Marland Kitchen glucosamine-chondroitin 500-400 MG tablet Take 1 tablet by mouth daily.     . metoprolol tartrate (LOPRESSOR) 25 MG tablet TAKE 1/2 TABLET BY MOUTH 2 TIMES DAILY. 90 tablet 2  . Multiple Vitamin (MULTI-VITAMINS) TABS Take by mouth.    . nitroGLYCERIN (NITROSTAT) 0.4 MG SL tablet PLACE 1 TABLET UNDER TONGUE EVERY 5 MINUTES AS NEEDED FOR CHEST PAIN 75 tablet 1  . omeprazole (PRILOSEC) 20 MG capsule Take 20 mg by mouth daily before breakfast.  1  . REPATHA SURECLICK 710  MG/ML SOAJ INJECT 1 PEN INTO THE SKIN EVERY 14 (FOURTEEN) DAYS. 2 mL 11   No current facility-administered medications for this visit.     Allergies:  Allergies  Allergen Reactions  . Statins Other (See Comments)    Muscle pain Tried 3 different statins       Physical Exam:   Blood pressure (!) 135/57, pulse (!) 50, temperature 97.9 F (36.6 C), temperature source Tympanic, resp. rate 19, height 5\' 10"  (1.778 m), weight 180 lb 9.6 oz (81.9 kg), SpO2 100 %.      ECOG: 0   General appearance: Alert, awake without any distress. Head: Atraumatic without abnormalities Oropharynx: Without any thrush or ulcers. Eyes: No scleral icterus. Lymph nodes: No lymphadenopathy noted in the cervical, supraclavicular, or axillary nodes Heart:regular rate and rhythm, without any murmurs or gallops.   Lung: Clear to auscultation without any rhonchi, wheezes or dullness to percussion. Abdomin: Soft, nontender without any shifting dullness or ascites. Musculoskeletal: No clubbing or cyanosis. Neurological: No motor or sensory deficits. Skin: No rashes or lesions.           Lab Results: Lab Results  Component Value Date   WBC 4.8 05/24/2020   HGB 11.7 (L) 05/24/2020   HCT 34.9 (L) 05/24/2020   MCV 88.1 05/24/2020   PLT 226 05/24/2020     Chemistry      Component Value Date/Time   NA 142  05/24/2020 0754   NA 138 10/22/2017 1211   NA 139 11/20/2016 0800   K 4.1 05/24/2020 0754   K 4.2 11/20/2016 0800   CL 108 05/24/2020 0754   CO2 26 05/24/2020 0754   CO2 24 11/20/2016 0800   BUN 10 05/24/2020 0754   BUN 19 10/22/2017 1211   BUN 16.7 11/20/2016 0800   CREATININE 0.77 05/24/2020 0754   CREATININE 0.8 11/20/2016 0800      Component Value Date/Time   CALCIUM 9.0 05/24/2020 0754   CALCIUM 9.1 11/20/2016 0800   ALKPHOS 55 05/24/2020 0754   ALKPHOS 58 11/20/2016 0800   AST 21 05/24/2020 0754   AST 18 11/20/2016 0800   ALT 22 05/24/2020 0754   ALT 16 11/20/2016  0800   BILITOT 0.5 05/24/2020 0754   BILITOT 0.40 11/20/2016 0800       Results for Jeff Decker, Jeff [name] "[name]" (MRN [id number]) as of 05/25/2020 08:23  Ref. Range 01/27/2020 09:21 05/24/2020 07:54  Prostate Specific Ag, Serum Latest Ref Range: 0.0 - 4.0 ng/mL <0.1 0.1        Impression and Plan:  75 year old man with:  1.  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  Marland Kitchen  His disease status was updated at this time including review of laboratory testing and imaging studies.  CT scan obtained on May 24, 2020 showed no evidence of disease progression with regression of his lymphadenopathy and bone involvement.  His PSA continues to be very low at this time.  Future treatment options such as Zytiga, systemic chemotherapy among others were reiterated and will be deferred unless he developed castration-resistant disease.  He understand that this is very likely to happen in the near future and at that time we will start such therapy.  2. Androgen deprivation therapy: I recommended continuing Eligard at this time.  He will receive injection today and repeated in 4 months.  Complications including sexual dysfunction, weight gain among others.   3. Bone directed therapy: He is currently on calcium and vitamin D supplements.  4.  Prognosis and goals of care: His disease is incurable although aggressive measures are warranted given his excellent pulm status.   5. Follow-up: In 4 months for repeat follow-up.  30  minutes were spent on this encounter.  The time was dedicated to reviewing disease status, reviewing imaging studies, discussing treatment options and complications related to current therapy.    Zola Button, MD 4/27/20228:20 AM

## 2020-05-25 NOTE — Patient Instructions (Signed)

## 2020-08-16 ENCOUNTER — Telehealth: Payer: Self-pay | Admitting: Cardiology

## 2020-08-16 NOTE — Telephone Encounter (Signed)
Pt c/o medication issue:  1. Name of Medication:  REPATHA SURECLICK 883 MG/ML SOAJ  2. How are you currently taking this medication (dosage and times per day)? N/A  3. Are you having a reaction (difficulty breathing--STAT)? N/A  4. What is your medication issue? [place] is calling to report the PA for this medication was approved from 08/15/20-08/15/21.

## 2020-08-30 ENCOUNTER — Other Ambulatory Visit: Payer: Self-pay

## 2020-08-30 ENCOUNTER — Ambulatory Visit: Payer: Medicare Other | Admitting: Cardiology

## 2020-08-30 VITALS — BP 124/62 | HR 49 | Ht 70.0 in | Wt 186.4 lb

## 2020-08-30 DIAGNOSIS — Z951 Presence of aortocoronary bypass graft: Secondary | ICD-10-CM | POA: Diagnosis not present

## 2020-08-30 DIAGNOSIS — I2510 Atherosclerotic heart disease of native coronary artery without angina pectoris: Secondary | ICD-10-CM

## 2020-08-30 DIAGNOSIS — E785 Hyperlipidemia, unspecified: Secondary | ICD-10-CM

## 2020-08-30 DIAGNOSIS — I259 Chronic ischemic heart disease, unspecified: Secondary | ICD-10-CM

## 2020-08-30 NOTE — Patient Instructions (Signed)
Medication Instructions:  Your physician recommends that you continue on your current medications as directed. Please refer to the Current Medication list given to you today.  *If you need a refill on your cardiac medications before your next appointment, please call your pharmacy*   Lab Work: None ordered.  If you have labs (blood work) drawn today and your tests are completely normal, you will receive your results only by: [facility] (if you have MyChart) OR A paper copy in the mail If you have any lab test that is abnormal or we need to change your treatment, we will call you to review the results.   Testing/Procedures: None ordered.    Follow-Up: At [facility], you and your health needs are our priority.  As part of our continuing mission to provide you with exceptional heart care, we have created designated Provider Care Teams.  These Care Teams include your primary Cardiologist (physician) and Advanced Practice Providers (APPs -  Physician Assistants and Nurse Practitioners) who all work together to provide you with the care you need, when you need it.  We recommend signing up for the patient portal called "MyChart".  Sign up information is provided on this After Visit Summary.  MyChart is used to connect with patients for Virtual Visits (Telemedicine).  Patients are able to view lab/test results, encounter notes, upcoming appointments, etc.  Non-urgent messages can be sent to your provider as well.   To learn more about what you can do with MyChart, go to [email or web address].    Your next appointment:   12 month(s)  The format for your next appointment:   In Person  Provider:   Candee Furbish, MD

## 2020-08-30 NOTE — Progress Notes (Signed)
Cardiology Office Note:    Date:  08/30/2020   ID:  Jeff Decker, DOB 1946-01-26, MRN [id number]  PCP:  [place] Cardiologist:  Candee Furbish, MD     Referring MD: Josefa Half*    History of Present Illness:    Jeff Decker is a 75 y.o. male here for follow up of CAD.   On 10/03/2017 had CABG x4 LIMA to LAD SVG to diagonal SVG to left circumflex SVG to PDA by Dr. Cyndia Bent.  EF improved from 35-55.  Enjoys horses. HR in 60's.     At prior visit in June 2021 had 4 to 5 days of intermittent chest discomfort fleeting.  [place] with the grandkids.  That night had the pain.  It stayed in his left chest.  He has had hard work since then and has felt okay.  Working hard  Past Medical History:  Diagnosis Date   Allergy    Arthritis    Cancer (Jeff Decker) 03/2016   prostate and bone , chemo completed   Coronary artery disease    GERD (gastroesophageal reflux disease)    Hyperlipidemia    Myocardial infarction (Jeff Decker) 1989    Past Surgical History:  Procedure Laterality Date   COLONOSCOPY  2006   CORONARY ANGIOPLASTY WITH STENT PLACEMENT     CORONARY ARTERY BYPASS GRAFT N/A 10/03/2017   Procedure: CORONARY ARTERY BYPASS GRAFTING (CABG) x 4, USING LEFT INTERNAL MAMMARY ARTERY AND RIGHT GREATER SAPHENOUS VEIN HARVESTED ENDOSCOPICALLY. LIMA TO LAD, SVG TO DIAG, SVG TO OM, SVG TO PDA.;  Surgeon: Gaye Pollack, MD;  Location: [facility];  Service: Open Heart Surgery;  Laterality: N/A;   HEMORRHOID SURGERY     LEFT HEART CATH AND CORONARY ANGIOGRAPHY N/A 10/01/2017   Procedure: LEFT HEART CATH AND CORONARY ANGIOGRAPHY;  Surgeon: Martinique, Peter M, MD;  Location: [facility];  Service: Cardiovascular;  Laterality: N/A;   ORIF SHOULDER FRACTURE     75 years old   PROSTATE BIOPSY  03/2016   TEE WITHOUT CARDIOVERSION N/A 10/03/2017   Procedure: TRANSESOPHAGEAL  ECHOCARDIOGRAM (TEE);  Surgeon: Gaye Pollack, MD;  Location: [facility];  Service: Open Heart Surgery;  Laterality: N/A;   TONSILLECTOMY AND ADENOIDECTOMY     75 years old    Current Medications: Current Meds  Medication Sig   acetaminophen (TYLENOL) 325 MG tablet Take 2 tablets (650 mg total) by mouth every 6 (six) hours as needed for mild pain.   aspirin EC 81 MG tablet Take 81 mg by mouth daily.   Calcium Carb-Cholecalciferol 600-800 MG-UNIT TABS Take 1 tablet by mouth daily.    fexofenadine (ALLEGRA) 180 MG tablet Take 180 mg by mouth daily.   glucosamine-chondroitin 500-400 MG tablet Take 1 tablet by mouth daily.    metoprolol tartrate (LOPRESSOR) 25 MG tablet TAKE 1/2 TABLET BY MOUTH 2 TIMES DAILY.   Multiple Vitamin (MULTI-VITAMINS) TABS Take by mouth.   nitroGLYCERIN (NITROSTAT) 0.4 MG SL tablet PLACE 1 TABLET UNDER TONGUE EVERY 5 MINUTES AS NEEDED FOR CHEST PAIN   omeprazole (PRILOSEC) 20 MG capsule Take 20 mg by mouth daily before breakfast.   REPATHA SURECLICK XX123456 MG/ML SOAJ INJECT 1 PEN INTO THE SKIN EVERY 14 (FOURTEEN) DAYS.     Allergies:   Statins   Social History   Socioeconomic History   Marital status: Widowed    Spouse name:  Not on file   Number of children: Not on file   Years of education: Not on file   Highest education level: High school graduate  Occupational History   Not on file  Tobacco Use   Smoking status: Former    Types: Cigarettes    Quit date: 08/01/1986    Years since quitting: 34.1   Smokeless tobacco: Never  Vaping Use   Vaping Use: Never used  Substance and Sexual Activity   Alcohol use: Yes    Alcohol/week: 4.0 standard drinks    Types: 4 Cans of beer per week   Drug use: Never   Sexual activity: Not on file  Other Topics Concern   Not on file  Social History Narrative   Not on file   Social Determinants of Health   Financial Resource Strain: Not on file  Food Insecurity: Not on file  Transportation Needs: Not on file  Physical  Activity: Not on file  Stress: Not on file  Social Connections: Not on file     Family History: The patient's family history includes Breast cancer in his mother; Colon cancer (age of onset: 45) in his father; Prostate cancer in his father. There is no history of Stomach cancer, Esophageal cancer, or Rectal cancer.  ROS:   Please see the history of present illness.     All other systems reviewed and are negative.  EKGs/Labs/Other Studies Reviewed:    The following studies were reviewed today:  Nuclear stress test 07/21/2019:  There was no ST segment deviation noted during stress. Nuclear stress EF: 43%. The left ventricular ejection fraction is moderately decreased (30-44%). Defect 1: There is a large defect of moderate severity present in the basal anterior, mid anterior, mid anteroseptal, apical anterior, apical septal, apical inferior, apical lateral and apex location. Findings consistent with prior myocardial infarction. This is an intermediate risk study.   1. LAD territory fixed perfusion defect with corresponding wall motion abnormality consistent with prior infarct 2. Intermediate risk study given reduced EF.  No evidence of ischemia  EKG:  EKG is  ordered today.  The ekg ordered today demonstrates SB 49 1 degree avb  Recent Labs: 05/24/2020: ALT 22; BUN 10; Creatinine 0.77; Hemoglobin 11.7; Platelet Count 226; Potassium 4.1; Sodium 142  Recent Lipid Panel    Component Value Date/Time   CHOL 136 07/01/2019 1111   TRIG 164 (H) 07/01/2019 1111   HDL 42 07/01/2019 1111   CHOLHDL 3.2 07/01/2019 1111   LDLCALC 66 07/01/2019 1111     Risk Assessment/Calculations:          Physical Exam:    VS:  BP 124/62   Pulse (!) 49   Ht '5\' 10"'$  (1.778 m)   Wt 186 lb 6.4 oz (84.6 kg)   BMI 26.75 kg/m     Wt Readings from Last 3 Encounters:  08/30/20 186 lb 6.4 oz (84.6 kg)  05/25/20 180 lb 9.6 oz (81.9 kg)  01/27/20 186 lb 8 oz (84.6 kg)     GEN:  Well nourished, well  developed in no acute distress HEENT: Normal NECK: No JVD; No carotid bruits LYMPHATICS: No lymphadenopathy CARDIAC: RRR, no murmurs, rubs, gallops RESPIRATORY:  Clear to auscultation without rales, wheezing or rhonchi  ABDOMEN: Soft, non-tender, non-distended MUSCULOSKELETAL:  No edema; No deformity  SKIN: Warm and dry NEUROLOGIC:  Alert and oriented x 3 PSYCHIATRIC:  Normal affect   ASSESSMENT:    1. Ischemic heart disease   2. S/P CABG x  4   3. Hyperlipidemia, unspecified hyperlipidemia type   4. Coronary artery disease involving native coronary artery of native heart without angina pectoris    PLAN:    In order of problems listed above:  Coronary artery disease status post CABG on 9/5/2019x4 - Prior operative anatomy reviewed. - Last year episode of chest discomfort resulted in stress test which showed old anterior infarct pattern as described above. Continuing with goal-directed medical therapy.  Currently on aspirin, very low-dose metoprolol which we will continue despite first-degree AV block of 212 ms.  When he is at home taking his blood pressures usually is pulses in the upper 60s.  No syncope.  Doing well.  Hyperlipidemia - Has had trouble in the past with statin intolerance.   Repatha.  Has been able to tolerate Repatha.  Most recent LDL 66.  Excellent.  Hemoglobin 11.7 creatinine 0.7 ALT 22.  Essential hypertension walking several miles a day.  Record his blood pressures at home.  Doing well.  Bilateral carotid artery stenosis - Mild less than 39%.  Consider repeating study next year.  Continue with goal-directed medical therapy.  Prostate cancer - Diagnosed in 2018 with a PSA of 262 and Gleason score of 9.  Okay to take sildenafil if necessary  He does not take nitroglycerin        Medication Adjustments/Labs and Tests Ordered: Current medicines are reviewed at length with the patient today.  Concerns regarding medicines are outlined above.  Orders Placed  This Encounter  Procedures   EKG 12-Lead    No orders of the defined types were placed in this encounter.   Patient Instructions  Medication Instructions:  Your physician recommends that you continue on your current medications as directed. Please refer to the Current Medication list given to you today.  *If you need a refill on your cardiac medications before your next appointment, please call your pharmacy*   Lab Work: None ordered.  If you have labs (blood work) drawn today and your tests are completely normal, you will receive your results only by: [place] (if you have MyChart) OR A paper copy in the mail If you have any lab test that is abnormal or we need to change your treatment, we will call you to review the results.   Testing/Procedures: None ordered.    Follow-Up: At [facility], you and your health needs are our priority.  As part of our continuing mission to provide you with exceptional heart care, we have created designated Provider Care Teams.  These Care Teams include your primary Cardiologist (physician) and Advanced Practice Providers (APPs -  Physician Assistants and Nurse Practitioners) who all work together to provide you with the care you need, when you need it.  We recommend signing up for the patient portal called "MyChart".  Sign up information is provided on this After Visit Summary.  MyChart is used to connect with patients for Virtual Visits (Telemedicine).  Patients are able to view lab/test results, encounter notes, upcoming appointments, etc.  Non-urgent messages can be sent to your provider as well.   To learn more about what you can do with MyChart, go to [email or web address].    Your next appointment:   12 month(s)  The format for your next appointment:   In Person  Provider:   Candee Furbish, MD   Signed, Candee Furbish, MD  08/30/2020 10:15 AM    [place]

## 2020-09-01 ENCOUNTER — Other Ambulatory Visit: Payer: Self-pay | Admitting: Cardiology

## 2020-09-19 ENCOUNTER — Other Ambulatory Visit: Payer: Self-pay | Admitting: Cardiology

## 2020-09-21 ENCOUNTER — Inpatient Hospital Stay: Payer: Medicare Other | Attending: Oncology

## 2020-09-21 ENCOUNTER — Other Ambulatory Visit: Payer: Self-pay

## 2020-09-21 ENCOUNTER — Inpatient Hospital Stay: Payer: Medicare Other

## 2020-09-21 ENCOUNTER — Inpatient Hospital Stay: Payer: Medicare Other | Admitting: Oncology

## 2020-09-21 VITALS — BP 131/67 | HR 51 | Temp 96.5°F | Resp 18 | Ht 70.0 in | Wt 189.4 lb

## 2020-09-21 DIAGNOSIS — Z79899 Other long term (current) drug therapy: Secondary | ICD-10-CM | POA: Insufficient documentation

## 2020-09-21 DIAGNOSIS — C61 Malignant neoplasm of prostate: Secondary | ICD-10-CM | POA: Diagnosis not present

## 2020-09-21 LAB — CMP (CANCER CENTER ONLY)
ALT: 20 U/L (ref 0–44)
AST: 22 U/L (ref 15–41)
Albumin: 4.1 g/dL (ref 3.5–5.0)
Alkaline Phosphatase: 60 U/L (ref 38–126)
Anion gap: 10 (ref 5–15)
BUN: 16 mg/dL (ref 8–23)
CO2: 25 mmol/L (ref 22–32)
Calcium: 9.1 mg/dL (ref 8.9–10.3)
Chloride: 105 mmol/L (ref 98–111)
Creatinine: 0.93 mg/dL (ref 0.61–1.24)
GFR, Estimated: >60 mL/min (ref >60)
Glucose, Bld: 95 mg/dL (ref 70–99)
Potassium: 4.2 mmol/L (ref 3.5–5.1)
Sodium: 140 mmol/L (ref 135–145)
Total Bilirubin: 0.7 mg/dL (ref 0.3–1.2)
Total Protein: 7.3 g/dL (ref 6.5–8.1)

## 2020-09-21 LAB — CBC WITH DIFFERENTIAL (CANCER CENTER ONLY)
Abs Immature Granulocytes: 0.01 10*3/uL (ref 0.00–0.07)
Basophils Absolute: 0.1 10*3/uL (ref 0.0–0.1)
Basophils Relative: 1 %
Eosinophils Absolute: 0.2 10*3/uL (ref 0.0–0.5)
Eosinophils Relative: 3 %
HCT: 38.5 % — ABNORMAL LOW (ref 39.0–52.0)
Hemoglobin: 12.7 g/dL — ABNORMAL LOW (ref 13.0–17.0)
Immature Granulocytes: 0 %
Lymphocytes Relative: 35 %
Lymphs Abs: 2 10*3/uL (ref 0.7–4.0)
MCH: 28.9 pg (ref 26.0–34.0)
MCHC: 33 g/dL (ref 30.0–36.0)
MCV: 87.7 fL (ref 80.0–100.0)
Monocytes Absolute: 0.7 10*3/uL (ref 0.1–1.0)
Monocytes Relative: 13 %
Neutro Abs: 2.7 10*3/uL (ref 1.7–7.7)
Neutrophils Relative %: 48 %
Platelet Count: 256 10*3/uL (ref 150–400)
RBC: 4.39 MIL/uL (ref 4.22–5.81)
RDW: 12.8 % (ref 11.5–15.5)
WBC Count: 5.7 10*3/uL (ref 4.0–10.5)
nRBC: 0 % (ref 0.0–0.2)

## 2020-09-21 MED ORDER — LEUPROLIDE ACETATE (4 MONTH) 30 MG ~~LOC~~ KIT
30.0000 mg | PACK | Freq: Once | SUBCUTANEOUS | Status: AC
  Administered 2020-09-21: 30 mg via SUBCUTANEOUS
  Filled 2020-09-21: qty 30

## 2020-09-21 NOTE — Progress Notes (Signed)
Hematology and Oncology Follow Up Visit  Louis Bolsinger [id number] 02-24-1945 75 y.o. 09/21/2020 8:37 AM [name], [facility] [place]   Principle Diagnosis: 75 year old man with advanced prostate cancer with disease to the bone and lymphadenopathy diagnosed in 2018.  He presented with castration-sensitive,PSA of 262 and a Gleason score 4+5 = 9 at the time of diagnosis.   Prior Therapy:  Taxotere chemotherapy at 75 mg/m cycle one given on 06/13/2016. He is S/P 6 cycles concluded on 09/25/2016.   Current therapy: Eligard every 4 months.  He will receive injection today.      Interim History: Mr. Condray is here for return evaluation.  Since her last visit, he reports no major changes in his health.  He denies any nausea, vomiting or fatigue.  He denies any hot flashes or changes in his appetite.  Performance status quality of life remain excellent.  He denies any bone pain or pathological fractures.     .      Medications: Reviewed without changes. Current Outpatient Medications  Medication Sig Dispense Refill   acetaminophen (TYLENOL) 325 MG tablet Take 2 tablets (650 mg total) by mouth every 6 (six) hours as needed for mild pain.     aspirin EC 81 MG tablet Take 81 mg by mouth daily.     Calcium Carb-Cholecalciferol 600-800 MG-UNIT TABS Take 1 tablet by mouth daily.      Evolocumab (REPATHA SURECLICK) XX123456 MG/ML SOAJ INJECT 1 PEN INTO THE SKIN EVERY 14 (FOURTEEN) DAYS. 2 mL 11   fexofenadine (ALLEGRA) 180 MG tablet Take 180 mg by mouth daily.     glucosamine-chondroitin 500-400 MG tablet Take 1 tablet by mouth daily.      metoprolol tartrate (LOPRESSOR) 25 MG tablet TAKE 1/2 TABLET BY MOUTH 2 TIMES DAILY 90 tablet 2   Multiple Vitamin (MULTI-VITAMINS) TABS Take by mouth.     nitroGLYCERIN (NITROSTAT) 0.4 MG SL tablet PLACE 1 TABLET UNDER TONGUE EVERY 5 MINUTES AS NEEDED FOR CHEST PAIN 75 tablet 1   omeprazole (PRILOSEC) 20 MG capsule  Take 20 mg by mouth daily before breakfast.  1   No current facility-administered medications for this visit.     Allergies:  Allergies  Allergen Reactions   Statins Other (See Comments)    Muscle pain Tried 3 different statins       Physical Exam:   Blood pressure 131/67, pulse (!) 51, temperature (!) 96.5 F (35.8 C), temperature source Oral, resp. rate 18, height '5\' 10"'$  (1.778 m), weight 189 lb 6.4 oz (85.9 kg), SpO2 100 %.       ECOG: 0    General appearance: Comfortable appearing without any discomfort Head: Normocephalic without any trauma Oropharynx: Mucous membranes are moist and pink without any thrush or ulcers. Eyes: Pupils are equal and round reactive to light. Lymph nodes: No cervical, supraclavicular, inguinal or axillary lymphadenopathy.   Heart:regular rate and rhythm.  S1 and S2 without leg edema. Lung: Clear without any rhonchi or wheezes.  No dullness to percussion. Abdomin: Soft, nontender, nondistended with good bowel sounds.  No hepatosplenomegaly. Musculoskeletal: No joint deformity or effusion.  Full range of motion noted. Neurological: No deficits noted on motor, sensory and deep tendon reflex exam. Skin: No petechial rash or dryness.  Appeared moist.            Lab Results: Lab Results  Component Value Date   WBC 4.8 05/24/2020   HGB 11.7 (L) 05/24/2020   HCT 34.9 (L) 05/24/2020  MCV 88.1 05/24/2020   PLT 226 05/24/2020     Chemistry      Component Value Date/Time   NA 142 05/24/2020 0754   NA 138 10/22/2017 1211   NA 139 11/20/2016 0800   K 4.1 05/24/2020 0754   K 4.2 11/20/2016 0800   CL 108 05/24/2020 0754   CO2 26 05/24/2020 0754   CO2 24 11/20/2016 0800   BUN 10 05/24/2020 0754   BUN 19 10/22/2017 1211   BUN 16.7 11/20/2016 0800   CREATININE 0.77 05/24/2020 0754   CREATININE 0.8 11/20/2016 0800      Component Value Date/Time   CALCIUM 9.0 05/24/2020 0754   CALCIUM 9.1 11/20/2016 0800   ALKPHOS 55  05/24/2020 0754   ALKPHOS 58 11/20/2016 0800   AST 21 05/24/2020 0754   AST 18 11/20/2016 0800   ALT 22 05/24/2020 0754   ALT 16 11/20/2016 0800   BILITOT 0.5 05/24/2020 0754   BILITOT 0.40 11/20/2016 0800              Impression and Plan:  75 year old man with:   1.  Advanced prostate cancer diagnosed in 2018.  He has castration-sensitive disease at this time.  Is a PSA from April 2022 showed slight increase currently at 0.1 although imaging studies did not show any evidence of disease progression.  Treatment options moving forward were discussed include continuing androgen deprivation alone versus addition of oral targeted therapy utilizing Zytiga, Xtandi among others.  Complication associated with his medication were reviewed including hypertension, adrenal sufficiency among others.  His will be deferred if he has documented castration-resistant disease.  We will continue to monitor his PSA for the time being.    2. Androgen deprivation therapy:  will proceed with Eligard today and repeated in 4 months.  Complication clinic weight gain, hot flashes among others were reviewed.   3. Bone directed therapy: Calcium and vitamin D supplements are recommended at this time.  4.  Prognosis and goals of care: Therapy remains palliative although aggressive measures warranted at this time.   5. Follow-up: He will return in 4 months for repeat evaluation.  30  minutes were dedicated to this visit.  Time spent on reviewing laboratory data, disease status update, discussing treatment choices and future plan of care and review.    Zola Button, MD 8/24/20228:37 AM

## 2020-09-22 ENCOUNTER — Telehealth: Payer: Self-pay | Admitting: *Deleted

## 2020-09-22 LAB — PROSTATE-SPECIFIC AG, SERUM (LABCORP): Prostate Specific Ag, Serum: 0.1 ng/mL (ref 0.0–4.0)

## 2020-09-22 NOTE — Telephone Encounter (Signed)
-----   Message from Wyatt Portela, MD sent at 09/22/2020  8:26 AM EDT ----- Please let him know his PSA continues to be low.

## 2020-09-22 NOTE — Telephone Encounter (Signed)
Contacted patient regarding test results per Dr. Hazeline Junker message. Patient verbalized understanding

## 2021-05-29 ENCOUNTER — Other Ambulatory Visit: Payer: Self-pay | Admitting: Cardiology

## 2021-06-14 ENCOUNTER — Inpatient Hospital Stay: Payer: Medicare Other | Admitting: Oncology

## 2021-06-14 ENCOUNTER — Telehealth: Payer: Self-pay | Admitting: Oncology

## 2021-06-14 ENCOUNTER — Other Ambulatory Visit: Payer: Self-pay

## 2021-06-14 ENCOUNTER — Inpatient Hospital Stay: Payer: Medicare Other | Attending: Oncology

## 2021-06-14 ENCOUNTER — Inpatient Hospital Stay: Payer: Medicare Other

## 2021-06-14 VITALS — BP 112/71 | HR 55 | Temp 97.9°F | Resp 17 | Ht 70.0 in | Wt 184.5 lb

## 2021-06-14 DIAGNOSIS — C61 Malignant neoplasm of prostate: Secondary | ICD-10-CM | POA: Insufficient documentation

## 2021-06-14 DIAGNOSIS — C7951 Secondary malignant neoplasm of bone: Secondary | ICD-10-CM | POA: Insufficient documentation

## 2021-06-14 LAB — CBC WITH DIFFERENTIAL (CANCER CENTER ONLY)
Abs Immature Granulocytes: 0.01 10*3/uL (ref 0.00–0.07)
Basophils Absolute: 0.1 10*3/uL (ref 0.0–0.1)
Basophils Relative: 1 %
Eosinophils Absolute: 0.1 10*3/uL (ref 0.0–0.5)
Eosinophils Relative: 1 %
HCT: 36.4 % — ABNORMAL LOW (ref 39.0–52.0)
Hemoglobin: 12.4 g/dL — ABNORMAL LOW (ref 13.0–17.0)
Immature Granulocytes: 0 %
Lymphocytes Relative: 31 %
Lymphs Abs: 2 10*3/uL (ref 0.7–4.0)
MCH: 29.4 pg (ref 26.0–34.0)
MCHC: 34.1 g/dL (ref 30.0–36.0)
MCV: 86.3 fL (ref 80.0–100.0)
Monocytes Absolute: 0.7 10*3/uL (ref 0.1–1.0)
Monocytes Relative: 10 %
Neutro Abs: 3.8 10*3/uL (ref 1.7–7.7)
Neutrophils Relative %: 57 %
Platelet Count: 254 10*3/uL (ref 150–400)
RBC: 4.22 MIL/uL (ref 4.22–5.81)
RDW: 12.8 % (ref 11.5–15.5)
WBC Count: 6.7 10*3/uL (ref 4.0–10.5)
nRBC: 0 % (ref 0.0–0.2)

## 2021-06-14 LAB — CMP (CANCER CENTER ONLY)
ALT: 19 U/L (ref 0–44)
AST: 22 U/L (ref 15–41)
Albumin: 4.5 g/dL (ref 3.5–5.0)
Alkaline Phosphatase: 58 U/L (ref 38–126)
Anion gap: 7 (ref 5–15)
BUN: 18 mg/dL (ref 8–23)
CO2: 26 mmol/L (ref 22–32)
Calcium: 9.5 mg/dL (ref 8.9–10.3)
Chloride: 106 mmol/L (ref 98–111)
Creatinine: 0.97 mg/dL (ref 0.61–1.24)
GFR, Estimated: >60 mL/min (ref >60)
Glucose, Bld: 107 mg/dL — ABNORMAL HIGH (ref 70–99)
Potassium: 4.1 mmol/L (ref 3.5–5.1)
Sodium: 139 mmol/L (ref 135–145)
Total Bilirubin: 0.7 mg/dL (ref 0.3–1.2)
Total Protein: 7.3 g/dL (ref 6.5–8.1)

## 2021-06-14 MED ORDER — LEUPROLIDE ACETATE (4 MONTH) 30 MG ~~LOC~~ KIT
30.0000 mg | PACK | Freq: Once | SUBCUTANEOUS | Status: AC
  Administered 2021-06-14: 30 mg via SUBCUTANEOUS
  Filled 2021-06-14: qty 30

## 2021-06-14 NOTE — Progress Notes (Signed)
Hematology and Oncology Follow Up Visit ? ?Jeff Decker ?[id number] ?Jul 29, 1945 76 y.o. ?06/14/2021 11:56 AM ?[facility] [place]  ? ?Principle Diagnosis: 76 year old man with castration-sensitive advanced prostate cancer with disease to the bone and lymphadenopathy diagnosed in 2018.  He was found to have PSA of 262 and a Gleason score 4+5 = 9.  ? ? ?Prior Therapy: ? ?Taxotere chemotherapy at 75 mg/m? cycle one given on 06/13/2016. He is S/P 6 cycles concluded on 09/25/2016.  ? ?Current therapy: Eligard 30 mg every 4 months.  Next injection is scheduled for today. ? ? ? ? ? ?Interim History: Jeff Decker returns today for a follow-up visit.  Since the last visit, he reports feeling well without any major complaints.  He denies any fevers chills or sweats.  He denies any recent hospitalizations or illnesses.  He denies any bone pain or pathological fractures.  His quality of life remain excellent. ? ? ? ? ?. ? ? ? ? ? ?Medications: Updated on review. ?Current Outpatient Medications  ?Medication Sig Dispense Refill  ? acetaminophen (TYLENOL) 325 MG tablet Take 2 tablets (650 mg total) by mouth every 6 (six) hours as needed for mild pain.    ? aspirin EC 81 MG tablet Take 81 mg by mouth daily.    ? Calcium Carb-Cholecalciferol 600-800 MG-UNIT TABS Take 1 tablet by mouth daily.     ? Evolocumab (REPATHA SURECLICK) 659 MG/ML SOAJ INJECT 1 PEN INTO THE SKIN EVERY 14 (FOURTEEN) DAYS. 2 mL 11  ? fexofenadine (ALLEGRA) 180 MG tablet Take 180 mg by mouth daily.    ? glucosamine-chondroitin 500-400 MG tablet Take 1 tablet by mouth daily.     ? metoprolol tartrate (LOPRESSOR) 25 MG tablet Take 0.5 tablets (12.5 mg total) by mouth 2 (two) times daily. TAKE 1/2 TABLET BY MOUTH 2 TIMES DAILY. 90 tablet 0  ? Multiple Vitamin (MULTI-VITAMINS) TABS Take by mouth.    ? nitroGLYCERIN (NITROSTAT) 0.4 MG SL tablet PLACE 1 TABLET UNDER TONGUE EVERY 5 MINUTES AS NEEDED FOR CHEST PAIN 75  tablet 1  ? omeprazole (PRILOSEC) 20 MG capsule Take 20 mg by mouth daily before breakfast.  1  ? ?No current facility-administered medications for this visit.  ? ? ? ?Allergies:  ?Allergies  ?Allergen Reactions  ? Statins Other (See Comments)  ?  Muscle pain Tried 3 different statins  ? ? ? ? ? ?Physical Exam: ? ? ?Blood pressure 112/71, pulse (!) 55, temperature 97.9 ?F (36.6 ?C), temperature source Temporal, resp. rate 17, height '5\' 10"'$  (1.778 m), weight 184 lb 8 oz (83.7 kg), SpO2 99 %. ? ? ? ? ? ? ?ECOG: 0 ? ? ?General appearance: Alert, awake without any distress. ?Head: Atraumatic without abnormalities ?Oropharynx: Without any thrush or ulcers. ?Eyes: No scleral icterus. ?Lymph nodes: No lymphadenopathy noted in the cervical, supraclavicular, or axillary nodes ?Heart:regular rate and rhythm, without any murmurs or gallops.   ?Lung: Clear to auscultation without any rhonchi, wheezes or dullness to percussion. ?Abdomin: Soft, nontender without any shifting dullness or ascites. ?Musculoskeletal: No clubbing or cyanosis. ?Neurological: No motor or sensory deficits. ?Skin: No rashes or lesions. ? ? ? ? ? ? ? ? ? ? ? ?Lab Results: ?Lab Results  ?Component Value Date  ? WBC 6.7 06/14/2021  ? HGB 12.4 (L) 06/14/2021  ? HCT 36.4 (L) 06/14/2021  ? MCV 86.3 06/14/2021  ? PLT 254 06/14/2021  ? ?  Chemistry   ?   ?Component Value  Date/Time  ? NA 140 09/21/2020 0833  ? NA 138 10/22/2017 1211  ? NA 139 11/20/2016 0800  ? K 4.2 09/21/2020 0833  ? K 4.2 11/20/2016 0800  ? CL 105 09/21/2020 0833  ? CO2 25 09/21/2020 0833  ? CO2 24 11/20/2016 0800  ? BUN 16 09/21/2020 0833  ? BUN 19 10/22/2017 1211  ? BUN 16.7 11/20/2016 0800  ? CREATININE 0.93 09/21/2020 0833  ? CREATININE 0.8 11/20/2016 0800  ?    ?Component Value Date/Time  ? CALCIUM 9.1 09/21/2020 0833  ? CALCIUM 9.1 11/20/2016 0800  ? ALKPHOS 60 09/21/2020 0833  ? ALKPHOS 58 11/20/2016 0800  ? AST 22 09/21/2020 0833  ? AST 18 11/20/2016 0800  ? ALT 20 09/21/2020 0833  ?  ALT 16 11/20/2016 0800  ? BILITOT 0.7 09/21/2020 0833  ? BILITOT 0.40 11/20/2016 0800  ?  ? ? ? ? ? ? ? ? ? ? ?Impression and Plan: ? ?76 year old man with: ?  ?1.  Castration-sensitive advanced prostate cancer diagnosed in 2018.   ? ?The natural course of this disease was reviewed at this time and treatment choices were discussed.  Salvage therapy options including abiraterone with prednisone versus enzalutamide as well as other options were reviewed.  These will be deferred if he developed castration hyper resistant disease.  Continue to monitor his PSA for the time being.  He is options were reiterated and he understands that this is likely to happen in the future. ? ?  ?2. Androgen deprivation therapy: Complications that include weight gain and hot flashes among others were reiterated.  He will receive Eligard today and repeated in 4 months. ? ? ?3. Bone directed therapy: I recommended to continue with calcium and vitamin D supplements. ? ?4.  Prognosis and goals of care: His disease is incurable although aggressive measures are warranted given his excellent performance status and tolerance to therapy. ? ?5. Follow-up: In 4 months for follow-up visit. ? ?30  minutes were spent on this encounter.  The time was dedicated to reviewing his disease status, treatment choices and future plan of care review. ? ? ? ?Zola Button, MD ?5/17/202311:56 AM ?

## 2021-06-14 NOTE — Telephone Encounter (Signed)
Scheduled per providers request, patient has been called and notified. ?

## 2021-06-15 LAB — PROSTATE-SPECIFIC AG, SERUM (LABCORP): Prostate Specific Ag, Serum: 0.3 ng/mL (ref 0.0–4.0)

## 2021-06-16 ENCOUNTER — Telehealth: Payer: Self-pay | Admitting: *Deleted

## 2021-06-16 NOTE — Telephone Encounter (Signed)
-----   Message from Wyatt Portela, MD sent at 06/15/2021  2:40 PM EDT ----- Please let him know his PSA is still low.

## 2021-06-16 NOTE — Telephone Encounter (Signed)
PC to patient, informed him his PSA is 0.3, he verbalizes understanding. 

## 2021-08-26 ENCOUNTER — Other Ambulatory Visit: Payer: Self-pay | Admitting: Cardiology

## 2021-08-27 ENCOUNTER — Other Ambulatory Visit: Payer: Self-pay | Admitting: Cardiology

## 2021-08-27 DIAGNOSIS — E785 Hyperlipidemia, unspecified: Secondary | ICD-10-CM

## 2021-08-27 DIAGNOSIS — I2510 Atherosclerotic heart disease of native coronary artery without angina pectoris: Secondary | ICD-10-CM

## 2021-08-27 DIAGNOSIS — I200 Unstable angina: Secondary | ICD-10-CM

## 2021-09-06 ENCOUNTER — Telehealth: Payer: Self-pay | Admitting: Oncology

## 2021-09-06 NOTE — Telephone Encounter (Signed)
Called patient regarding upcoming September appointment, patient is notified.  

## 2021-09-22 ENCOUNTER — Other Ambulatory Visit: Payer: Self-pay | Admitting: Cardiology

## 2021-09-26 NOTE — Progress Notes (Unsigned)
Office Visit    Patient Name: Jeff Decker Date of Encounter: 09/28/2021  Primary Care Provider:  [place] Cardiologist:  Candee Furbish, MD Primary Electrophysiologist: None  Chief Complaint    Jeff Decker is a 76 y.o. male with PMH of CAD with proximal LAD stent placed in 1990 and s/p CABG x4 LIMA to LAD SVG to diagonal SVG to left circumflex SVG to PDA IN 2019, ICM with recovered EF of 50-55%, HLD, GERD, prostate CA, arthritis who presents today for follow-up of coronary artery disease.  Past Medical History    Past Medical History:  Diagnosis Date   Allergy    Arthritis    Cancer ([place]) 03/2016   prostate and bone , chemo completed   Coronary artery disease    GERD (gastroesophageal reflux disease)    Hyperlipidemia    Myocardial infarction ([place]) 1989   Past Surgical History:  Procedure Laterality Date   COLONOSCOPY  2006   CORONARY ANGIOPLASTY WITH STENT PLACEMENT     CORONARY ARTERY BYPASS GRAFT N/A 10/03/2017   Procedure: CORONARY ARTERY BYPASS GRAFTING (CABG) x 4, USING LEFT INTERNAL MAMMARY ARTERY AND RIGHT GREATER SAPHENOUS VEIN HARVESTED ENDOSCOPICALLY. LIMA TO LAD, SVG TO DIAG, SVG TO OM, SVG TO PDA.;  Surgeon: Gaye Pollack, MD;  Location: [facility];  Service: Open Heart Surgery;  Laterality: N/A;   HEMORRHOID SURGERY     LEFT HEART CATH AND CORONARY ANGIOGRAPHY N/A 10/01/2017   Procedure: LEFT HEART CATH AND CORONARY ANGIOGRAPHY;  Surgeon: Martinique, Peter M, MD;  Location: [place] [facility];  Service: Cardiovascular;  Laterality: N/A;   ORIF SHOULDER FRACTURE     76 years old   PROSTATE BIOPSY  03/2016   TEE WITHOUT CARDIOVERSION N/A 10/03/2017   Procedure: TRANSESOPHAGEAL ECHOCARDIOGRAM (TEE);  Surgeon: Gaye Pollack, MD;  Location: [place];  Service: Open Heart Surgery;  Laterality: N/A;   TONSILLECTOMY AND ADENOIDECTOMY     76 years old    Allergies  Allergies  Allergen Reactions   Statins Other (See  Comments)    Muscle pain Tried 3 different statins    History of Present Illness    Jeff Decker is a 76 year old male with the above-mentioned past medical history who presents today for follow-up of coronary artery disease.  [name] was recently seen by Dr. Marlou Porch in 2019 for complaint of shortness of breath and angina.  He had a previous proximal LAD stent placed in 1990 that felt similar during appointment.  He was sent for [facility] that revealed severe two-vessel disease with 90% mid LAD and 99% PDA.  Patient underwent CABG in 2019 x4 by Dr. Cyndia Bent that was successful without complication.  2D echo was completed with EF 50-55%, with grade 1 DD and trivial AV regurgitation.   He had a complaint of intermittent chest discomfort while walking in 2021.  He was sent for [facility] to evaluate that showed fixed perfusion defect consistent with prior infarct with no evidence of new ischemia. He was  recently seen on 08/2020 for follow-up and reported doing well with no recurrent chest pain and being treated with Repatha due to statin intolerance.  [name] presents today alone for annual follow-up of coronary artery disease.  Since last being seen in the office patient reports he has been doing well and has no complaints of angina or palpitations.  He is currently resumed physical activity and aloe is going to the gym twice  a week working out with a friend.  He is watching his diet and standing from excess red meat.  He is fasting today for visit and we will check his lipids.  Patient denies chest pain, palpitations, dyspnea, PND, orthopnea, nausea, vomiting, dizziness, syncope, edema, weight gain, or early satiety.  Home Medications    Current Outpatient Medications  Medication Sig Dispense Refill   acetaminophen (TYLENOL) 325 MG tablet Take 2 tablets (650 mg total) by mouth every 6 (six) hours as needed for mild pain.     aspirin EC 81 MG tablet Take 81 mg by mouth daily.     Calcium  Carb-Cholecalciferol 600-800 MG-UNIT TABS Take 1 tablet by mouth daily.      Evolocumab (REPATHA SURECLICK) 782 MG/ML SOAJ INJECT 1 PEN INTO THE SKIN EVERY 14 (FOURTEEN) DAYS. 6 mL 3   fexofenadine (ALLEGRA) 180 MG tablet Take 180 mg by mouth daily.     glucosamine-chondroitin 500-400 MG tablet Take 1 tablet by mouth daily.      metoprolol tartrate (LOPRESSOR) 25 MG tablet TAKE 0.5 TABLETS BY MOUTH 2 TIMES DAILY. 30 tablet 0   Multiple Vitamin (MULTI-VITAMINS) TABS Take by mouth.     nitroGLYCERIN (NITROSTAT) 0.4 MG SL tablet PLACE 1 TABLET UNDER TONGUE EVERY 5 MINUTES AS NEEDED FOR CHEST PAIN 75 tablet 1   omeprazole (PRILOSEC) 20 MG capsule Take 20 mg by mouth daily before breakfast.  1   No current facility-administered medications for this visit.     Review of Systems  Please see the history of present illness.     All other systems reviewed and are otherwise negative except as noted above.  Physical Exam    Wt Readings from Last 3 Encounters:  09/28/21 183 lb 9.6 oz (83.3 kg)  06/14/21 184 lb 8 oz (83.7 kg)  09/21/20 189 lb 6.4 oz (85.9 kg)   VS: Vitals:   09/28/21 0839  BP: 116/70  Pulse: (!) 51  SpO2: 94%  ,Body mass index is 25.61 kg/m.  Constitutional:      Appearance: Healthy appearance. Not in distress.  Neck:     Vascular: JVD normal.  Pulmonary:     Effort: Pulmonary effort is normal.     Breath sounds: No wheezing. No rales. Diminished in the bases Cardiovascular:     Normal rate. Regular rhythm. Normal S1. Normal S2.      Murmurs: There is no murmur.  Edema:    Peripheral edema absent.  Abdominal:     Palpations: Abdomen is soft non tender. There is no hepatomegaly.  Skin:    General: Skin is warm and dry.  Neurological:     General: No focal deficit present.     Mental Status: Alert and oriented to person, place and time.     Cranial Nerves: Cranial nerves are intact.  EKG/LABS/Other Studies Reviewed    ECG personally reviewed by me today -sinus  bradycardia with first-degree AV block and right axis deviation with rate of 51 and no acute changes similar to previous EKG   Lab Results  Component Value Date   WBC 6.7 06/14/2021   HGB 12.4 (L) 06/14/2021   HCT 36.4 (L) 06/14/2021   MCV 86.3 06/14/2021   PLT 254 06/14/2021   Lab Results  Component Value Date   CREATININE 0.97 06/14/2021   BUN 18 06/14/2021   NA 139 06/14/2021   K 4.1 06/14/2021   CL 106 06/14/2021   CO2 26 06/14/2021   Lab Results  Component Value Date   ALT 19 06/14/2021   AST 22 06/14/2021   ALKPHOS 58 06/14/2021   BILITOT 0.7 06/14/2021   Lab Results  Component Value Date   CHOL 136 07/01/2019   HDL 42 07/01/2019   LDLCALC 66 07/01/2019   TRIG 164 (H) 07/01/2019   CHOLHDL 3.2 07/01/2019    Lab Results  Component Value Date   HGBA1C 5.7 (H) 10/02/2017    Assessment & Plan    1.  Coronary artery disease: -s/p CABG x 4 in 2019 by Dr. Cyndia Bent -Patient reports today that he has been doing well with no angina with physical activity -Continue GDMT with ASA 81 mg, Repatha 140 mg, and metoprolol to tartrate 12.5 mg twice daily  2.  Ischemic cardiomyopathy: - 2D echo was completed with EF 50-55%, with grade 1 DD and trivial AV regurgitation.  -Patient's blood pressure is well controlled at 116/70 -Patient is euvolemic on examination  3.  Prostate CA: -Currently followed by oncology and is doing injections every 4 months  4.  History of carotid stenosis: -Patient had less than 39% blockage last year's study -We will repeat carotid ultrasound for annual surveillance Continue GDMT as noted above  5. Hyperlipidemia: -last LDL was 66 -Patient currently on Repatha 140 mg twice monthly -We will order lipids today -Patient advised to continue heart healthy fiber diet -Continue physical activity at least 150 minutes/week and advance as tolerated  Disposition: Follow-up with Candee Furbish, MD or APP in 12 months   Medication Adjustments/Labs and  Tests Ordered: Current medicines are reviewed at length with the patient today.  Concerns regarding medicines are outlined above.   Signed, Mable Fill, Marissa Nestle, NP 09/28/2021, 9:04 AM [place]

## 2021-09-28 ENCOUNTER — Encounter: Payer: Self-pay | Admitting: Nurse Practitioner

## 2021-09-28 ENCOUNTER — Ambulatory Visit: Payer: Medicare Other | Attending: Nurse Practitioner | Admitting: Nurse Practitioner

## 2021-09-28 VITALS — BP 116/70 | HR 51 | Ht 71.0 in | Wt 183.6 lb

## 2021-09-28 DIAGNOSIS — I2510 Atherosclerotic heart disease of native coronary artery without angina pectoris: Secondary | ICD-10-CM | POA: Diagnosis not present

## 2021-09-28 DIAGNOSIS — E785 Hyperlipidemia, unspecified: Secondary | ICD-10-CM | POA: Diagnosis not present

## 2021-09-28 DIAGNOSIS — C61 Malignant neoplasm of prostate: Secondary | ICD-10-CM

## 2021-09-28 DIAGNOSIS — I259 Chronic ischemic heart disease, unspecified: Secondary | ICD-10-CM | POA: Diagnosis not present

## 2021-09-28 DIAGNOSIS — I6529 Occlusion and stenosis of unspecified carotid artery: Secondary | ICD-10-CM

## 2021-09-28 LAB — LIPID PANEL
Chol/HDL Ratio: 3.4 ratio (ref 0.0–5.0)
Cholesterol, Total: 142 mg/dL (ref 100–199)
HDL: 42 mg/dL (ref >39)
LDL Chol Calc (NIH): 71 mg/dL (ref 0–99)
Triglycerides: 171 mg/dL — ABNORMAL HIGH (ref 0–149)
VLDL Cholesterol Cal: 29 mg/dL (ref 5–40)

## 2021-09-28 MED ORDER — METOPROLOL TARTRATE 25 MG PO TABS: ORAL_TABLET | ORAL | 3 refills | Status: DC

## 2021-09-28 MED ORDER — NITROGLYCERIN 0.4 MG SL SUBL: SUBLINGUAL_TABLET | SUBLINGUAL | 0 refills | Status: DC

## 2021-09-28 NOTE — Patient Instructions (Addendum)
  Medication Instructions:   Your physician recommends that you continue on your current medications as directed. Please refer to the Current Medication list given to you today.   *If you need a refill on your cardiac medications before your next appointment, please call your pharmacy*   Lab Work: [place]    If you have labs (blood work) drawn today and your tests are completely normal, you will receive your results only by: [place] (if you have MyChart) OR A paper copy in the mail If you have any lab test that is abnormal or we need to change your treatment, we will call you to review the results.   Testing/Procedures: Your physician has requested that you have a carotid duplex. This test is an ultrasound of the carotid arteries in your neck. It looks at blood flow through these arteries that supply the brain with blood. Allow one hour for this exam. There are no restrictions or special instructions.    Follow-Up: At [facility], you and your health needs are our priority.  As part of our continuing mission to provide you with exceptional heart care, we have created designated Provider Care Teams.  These Care Teams include your primary Cardiologist (physician) and Advanced Practice Providers (APPs -  Physician Assistants and Nurse Practitioners) who all work together to provide you with the care you need, when you need it.  We recommend signing up for the patient portal called "MyChart".  Sign up information is provided on this After Visit Summary.  MyChart is used to connect with patients for Virtual Visits (Telemedicine).  Patients are able to view lab/test results, encounter notes, upcoming appointments, etc.  Non-urgent messages can be sent to your provider as well.   To learn more about what you can do with MyChart, go to [email or web address].    Your next appointment:   1 year(s)  The format for your next appointment:   In Person  Provider:   Candee Furbish, MD or  Tempie Donning NP  Other Instructions   Important Information About Sugar

## 2021-09-29 ENCOUNTER — Ambulatory Visit (HOSPITAL_COMMUNITY)
Admission: RE | Admit: 2021-09-29 | Discharge: 2021-09-29 | Disposition: A | Discharge status: Home / Self Care | Payer: Medicare Other | Source: Ambulatory Visit | Attending: Cardiology | Admitting: Cardiology

## 2021-09-29 DIAGNOSIS — I6521 Occlusion and stenosis of right carotid artery: Secondary | ICD-10-CM | POA: Diagnosis not present

## 2021-09-29 DIAGNOSIS — I2510 Atherosclerotic heart disease of native coronary artery without angina pectoris: Secondary | ICD-10-CM | POA: Insufficient documentation

## 2021-10-07 ENCOUNTER — Other Ambulatory Visit: Payer: Self-pay | Admitting: Nurse Practitioner

## 2021-10-10 ENCOUNTER — Other Ambulatory Visit: Payer: Self-pay | Admitting: Pharmacist

## 2021-10-10 ENCOUNTER — Ambulatory Visit: Payer: Medicare Other | Admitting: Oncology

## 2021-10-10 ENCOUNTER — Ambulatory Visit: Payer: Medicare Other

## 2021-10-10 ENCOUNTER — Other Ambulatory Visit: Payer: Medicare Other

## 2021-10-10 DIAGNOSIS — I200 Unstable angina: Secondary | ICD-10-CM

## 2021-10-10 DIAGNOSIS — I2510 Atherosclerotic heart disease of native coronary artery without angina pectoris: Secondary | ICD-10-CM

## 2021-10-10 DIAGNOSIS — E785 Hyperlipidemia, unspecified: Secondary | ICD-10-CM

## 2021-10-10 MED ORDER — REPATHA SURECLICK 140 MG/ML ~~LOC~~ SOAJ: SUBCUTANEOUS | 3 refills | Status: DC

## 2021-10-11 ENCOUNTER — Inpatient Hospital Stay: Payer: Medicare Other | Attending: Oncology

## 2021-10-11 ENCOUNTER — Other Ambulatory Visit: Payer: Self-pay

## 2021-10-11 ENCOUNTER — Inpatient Hospital Stay: Payer: Medicare Other | Admitting: Oncology

## 2021-10-11 ENCOUNTER — Inpatient Hospital Stay: Payer: Medicare Other

## 2021-10-11 VITALS — BP 116/61 | HR 78 | Temp 97.9°F | Resp 17 | Ht 71.0 in | Wt 186.7 lb

## 2021-10-11 DIAGNOSIS — C61 Malignant neoplasm of prostate: Secondary | ICD-10-CM

## 2021-10-11 DIAGNOSIS — Z79899 Other long term (current) drug therapy: Secondary | ICD-10-CM | POA: Insufficient documentation

## 2021-10-11 DIAGNOSIS — C7951 Secondary malignant neoplasm of bone: Secondary | ICD-10-CM | POA: Insufficient documentation

## 2021-10-11 LAB — CBC WITH DIFFERENTIAL (CANCER CENTER ONLY)
Abs Immature Granulocytes: 0.01 10*3/uL (ref 0.00–0.07)
Basophils Absolute: 0 10*3/uL (ref 0.0–0.1)
Basophils Relative: 1 %
Eosinophils Absolute: 0.1 10*3/uL (ref 0.0–0.5)
Eosinophils Relative: 2 %
HCT: 34.1 % — ABNORMAL LOW (ref 39.0–52.0)
Hemoglobin: 11.9 g/dL — ABNORMAL LOW (ref 13.0–17.0)
Immature Granulocytes: 0 %
Lymphocytes Relative: 32 %
Lymphs Abs: 1.6 10*3/uL (ref 0.7–4.0)
MCH: 29.9 pg (ref 26.0–34.0)
MCHC: 34.9 g/dL (ref 30.0–36.0)
MCV: 85.7 fL (ref 80.0–100.0)
Monocytes Absolute: 0.5 10*3/uL (ref 0.1–1.0)
Monocytes Relative: 10 %
Neutro Abs: 2.7 10*3/uL (ref 1.7–7.7)
Neutrophils Relative %: 55 %
Platelet Count: 250 10*3/uL (ref 150–400)
RBC: 3.98 MIL/uL — ABNORMAL LOW (ref 4.22–5.81)
RDW: 12.1 % (ref 11.5–15.5)
WBC Count: 5 10*3/uL (ref 4.0–10.5)
nRBC: 0 % (ref 0.0–0.2)

## 2021-10-11 LAB — CMP (CANCER CENTER ONLY)
ALT: 18 U/L (ref 0–44)
AST: 18 U/L (ref 15–41)
Albumin: 4.2 g/dL (ref 3.5–5.0)
Alkaline Phosphatase: 50 U/L (ref 38–126)
Anion gap: 4 — ABNORMAL LOW (ref 5–15)
BUN: 20 mg/dL (ref 8–23)
CO2: 28 mmol/L (ref 22–32)
Calcium: 9.2 mg/dL (ref 8.9–10.3)
Chloride: 107 mmol/L (ref 98–111)
Creatinine: 0.81 mg/dL (ref 0.61–1.24)
GFR, Estimated: >60 mL/min (ref >60)
Glucose, Bld: 121 mg/dL — ABNORMAL HIGH (ref 70–99)
Potassium: 4.2 mmol/L (ref 3.5–5.1)
Sodium: 139 mmol/L (ref 135–145)
Total Bilirubin: 0.3 mg/dL (ref 0.3–1.2)
Total Protein: 6.6 g/dL (ref 6.5–8.1)

## 2021-10-11 MED ORDER — LEUPROLIDE ACETATE (4 MONTH) 30 MG ~~LOC~~ KIT
30.0000 mg | PACK | Freq: Once | SUBCUTANEOUS | Status: AC
  Administered 2021-10-11: 30 mg via SUBCUTANEOUS
  Filled 2021-10-11: qty 30

## 2021-10-11 NOTE — Progress Notes (Signed)
Hematology and Oncology Follow Up Visit  Jeff Decker [id number] [birth year] 76 y.o. 10/11/2021 8:06 AM [name] [place]   Principle Diagnosis: 76 year old man with advanced prostate cancer with bone and lymphadenopathy diagnosed in 2018.  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   Prior Therapy:  Taxotere chemotherapy at 75 mg/m cycle one given on 06/13/2016. He is S/P 6 cycles concluded on 09/25/2016.   Current therapy: Eligard 30 mg every 4 months.  He will receive his next injection on October 11, 2021.      Interim History: Jeff Decker presents today for repeat follow-up.  Since last visit, he reports no major changes in his health.  He reports feeling well without any major complaints.  He has reported some occasional hot flashes and erectile dysfunction associated with Eligard but no other complaints.  He denies any bone pain or pathological fractures.  He denies any weight loss or appetite changes.  He remains active and continues to attend to activities of daily living.     .      Medications: Reviewed without changes. Current Outpatient Medications  Medication Sig Dispense Refill   acetaminophen (TYLENOL) 325 MG tablet Take 2 tablets (650 mg total) by mouth every 6 (six) hours as needed for mild pain.     aspirin EC 81 MG tablet Take 81 mg by mouth daily.     Calcium Carb-Cholecalciferol 600-800 MG-UNIT TABS Take 1 tablet by mouth daily.      Evolocumab (REPATHA SURECLICK) 767 MG/ML SOAJ INJECT 1 PEN INTO THE SKIN EVERY 14 (FOURTEEN) DAYS. 6 mL 3   fexofenadine (ALLEGRA) 180 MG tablet Take 180 mg by mouth daily.     glucosamine-chondroitin 500-400 MG tablet Take 1 tablet by mouth daily.      metoprolol tartrate (LOPRESSOR) 25 MG tablet TAKE 0.5 TABLETS BY MOUTH 2 TIMES DAILY. 90 tablet 3   Multiple Vitamin (MULTI-VITAMINS) TABS Take by  mouth.     nitroGLYCERIN (NITROSTAT) 0.4 MG SL tablet PLACE 1 TABLET UNDER TONGUE EVERY 5 MINUTES AS NEEDED FOR CHEST PAIN. 75 tablet 2   omeprazole (PRILOSEC) 20 MG capsule Take 20 mg by mouth daily before breakfast.  1   No current facility-administered medications for this visit.     Allergies:  Allergies  Allergen Reactions   Statins Other (See Comments)    Muscle pain Tried 3 different statins       Physical Exam:     Blood pressure 116/61, pulse 78, temperature 97.9 F (36.6 C), temperature source Temporal, resp. rate 17, height '5\' 11"'$  (1.803 m), weight 186 lb 11.2 oz (84.7 kg), SpO2 97 %.      ECOG: 0   General appearance: Comfortable appearing without any discomfort Head: Normocephalic without any trauma Oropharynx: Mucous membranes are moist and pink without any thrush or ulcers. Eyes: Pupils are equal and round reactive to light. Lymph nodes: No cervical, supraclavicular, inguinal or axillary lymphadenopathy.   Heart:regular rate and rhythm.  S1 and S2 without leg edema. Lung: Clear without any rhonchi or wheezes.  No dullness to percussion. Abdomin: Soft, nontender, nondistended with good bowel sounds.  No hepatosplenomegaly. Musculoskeletal: No joint deformity or effusion.  Full range of motion noted. Neurological: No deficits noted on motor, sensory and deep tendon reflex exam. Skin: No petechial rash or dryness.  Appeared moist.  Lab Results: Lab Results  Component Value Date   WBC 6.7 06/14/2021   HGB 12.4 (L) 06/14/2021   HCT 36.4 (L) 06/14/2021   MCV 86.3 06/14/2021   PLT 254 06/14/2021     Chemistry      Component Value Date/Time   NA 139 06/14/2021 1129   NA 138 10/22/2017 1211   NA 139 11/20/2016 0800   K 4.1 06/14/2021 1129   K 4.2 11/20/2016 0800   CL 106 06/14/2021 1129   CO2 26 06/14/2021 1129   CO2 24 11/20/2016 0800   BUN 18 06/14/2021 1129   BUN 19 10/22/2017 1211   BUN 16.7 11/20/2016 0800    CREATININE 0.97 06/14/2021 1129   CREATININE 0.8 11/20/2016 0800      Component Value Date/Time   CALCIUM 9.5 06/14/2021 1129   CALCIUM 9.1 11/20/2016 0800   ALKPHOS 58 06/14/2021 1129   ALKPHOS 58 11/20/2016 0800   AST 22 06/14/2021 1129   AST 18 11/20/2016 0800   ALT 19 06/14/2021 1129   ALT 16 11/20/2016 0800   BILITOT 0.7 06/14/2021 1129   BILITOT 0.40 11/20/2016 0800              Impression and Plan:  76 year old man with:   1.  Advanced prostate cancer with disease to the lymph nodes diagnosed in 2018.  He has castration-sensitive at this time.  Treatment options at this time were discussed given the slow rise in his PSA.  Continued active surveillance versus initiating androgen receptor pathway inhibitors including abiraterone, enzalutamide among others were discussed.  Complications that include nausea, fatigue and hypertension associated with these treatments were discussed.  After discussion today, he is agreeable to continue at this time.  We will continue to monitor his PSA and institute therapy if he has rapid doubling time his PSA.   2. Androgen deprivation therapy: He will receive Eligard today and repeated in 4 months.  Complications include weight gain, hot flashes and sexual dysfunction were reiterated.  He is agreeable to proceed.   3. Bone directed therapy: Recommended calcium and vitamin D supplements and bone directed therapy if he developed metastatic disease to the bone.  4.  Prognosis and goals of care: Therapy remains palliative although aggressive measures are warranted given his excellent performance status.  5. Follow-up: He will return in 4 months for a follow-up.  30  minutes were dedicated to this visit.  The time was spent on reviewing laboratory data, disease status update and outlining future plan of care discussion.    Zola Button, MD 9/13/20238:06 AM

## 2021-10-11 NOTE — Patient Instructions (Signed)
Eligard

## 2021-10-13 ENCOUNTER — Telehealth: Payer: Self-pay | Admitting: *Deleted

## 2021-10-13 LAB — PROSTATE-SPECIFIC AG, SERUM (LABCORP): Prostate Specific Ag, Serum: 0.2 ng/mL (ref 0.0–4.0)

## 2021-10-13 NOTE — Telephone Encounter (Signed)
-----   Message from Wyatt Portela, MD sent at 10/13/2021  8:58 AM EDT ----- Please let him know his PSA is down

## 2021-10-13 NOTE — Telephone Encounter (Signed)
PC to patient, informed him of his PSA results from 10/11/21, he verbalizes understanding.

## 2021-12-29 ENCOUNTER — Telehealth: Payer: Self-pay | Admitting: Cardiology

## 2021-12-29 NOTE — Telephone Encounter (Signed)
   Name: Jeff Decker  DOB: 09-06-1945  MRN: [id number]  Primary Cardiologist: Candee Furbish, MD   Preoperative team, please contact this patient and set up a phone call appointment for further preoperative risk assessment. Please obtain consent and complete medication review. Thank you for your help.  I confirm that guidance regarding antiplatelet and oral anticoagulation therapy has been completed and, if necessary, noted below.  Given the patient's comorbid conditions, we prefer to continue ASA throughout the perioperative period. However, if doing so significantly increases morbidity or mortality, may hold for 5-7 days.   Mayra Reel, NP 12/29/2021, 9:45 AM [place]

## 2021-12-29 NOTE — Telephone Encounter (Signed)
   [facility] Pre-operative Risk Assessment    Request for surgical clearance:  What type of surgery is being performed?  Left Carpal Tunnel Release Left Ring Trigger Finger Release  When is this surgery scheduled?  01/05/22   What type of clearance is required (medical clearance vs. Pharmacy clearance to hold med vs. Both)?  Medical   Are there any medications that need to be held prior to surgery and how long? No    Practice name and name of physician performing surgery?  [facility]  Dr. Ala Bent   What is your office phone number? [phone]    7.   What is your office fax number? [phone]  8.   Anesthesia type (None, local, MAC, general) ?  MAC w/ Local   Jeff Decker 12/29/2021, 8:20 AM

## 2021-12-31 ENCOUNTER — Other Ambulatory Visit: Payer: Self-pay | Admitting: Cardiology

## 2022-01-02 ENCOUNTER — Telehealth: Payer: Self-pay | Admitting: *Deleted

## 2022-01-02 ENCOUNTER — Ambulatory Visit: Payer: Medicare Other | Attending: Physician Assistant | Admitting: Physician Assistant

## 2022-01-02 DIAGNOSIS — Z01810 Encounter for preprocedural cardiovascular examination: Secondary | ICD-10-CM

## 2022-01-02 NOTE — Telephone Encounter (Signed)
[facility] calling for an update on clearance. Informed her pt will receive a call to sch tele visit.

## 2022-01-02 NOTE — Telephone Encounter (Signed)
Pt has been scheduled for a tele pre op appt today at 1:40. Procedure date is 01/05/22. Pt states to remove a cyst using local anesthesia.

## 2022-01-02 NOTE — Progress Notes (Signed)
Virtual Visit via Telephone Note   Because of Jeff Decker's co-morbid illnesses, he is at least at moderate risk for complications without adequate follow up.  This format is felt to be most appropriate for this patient at this time.  The patient did not have access to video technology/had technical difficulties with video requiring transitioning to audio format only (telephone).  All issues noted in this document were discussed and addressed.  No physical exam could be performed with this format.  Please refer to the patient's chart for his consent to telehealth for [facility].  Evaluation Performed:  Preoperative cardiovascular risk assessment _____________   Date:  01/02/2022   Patient ID:  Jeff Decker, DOB 09-May-1945, MRN [id number] Patient Location:  Home Provider location:   Office  [facility]  [name] Cardiologist:  Candee Furbish, MD  Chief Complaint / Patient Profile   76 y.o. y/o male with a h/o CAD s/p remote stent 1990 and CABGx4 in 2019, ICM, HLD, GERA, prostate CA, arthritis who is pending Left Carpal Tunnel Release and Left Ring Trigger Finger Release and presents today for telephonic preoperative cardiovascular risk assessment.  History of Present Illness    Jeff Decker is a 76 y.o. male who presents via audio/video conferencing for a telehealth visit today.  Last stress test in 2021 showed prior infarct but no ischemia. EF was 43% by that study, similar to 45-50% by TEE at time of CABG. Pt was last seen in cardiology clinic on 09/28/21 by Ambrose Pancoast NP. At that time Jeff Decker was doing well and exercising regularly. The patient is now pending procedure as outlined above. Since his last visit, he reports he has been doing great without any new chest pain or SOB. Exercises regularly without angina.  Past Medical History    Past Medical History:  Diagnosis Date   Allergy     Arthritis    Cancer ([place]) 03/2016   prostate and bone , chemo completed   Coronary artery disease    GERD (gastroesophageal reflux disease)    Hyperlipidemia    Myocardial infarction ([place]) 1989   Past Surgical History:  Procedure Laterality Date   COLONOSCOPY  2006   CORONARY ANGIOPLASTY WITH STENT PLACEMENT     CORONARY ARTERY BYPASS GRAFT N/A 10/03/2017   Procedure: CORONARY ARTERY BYPASS GRAFTING (CABG) x 4, USING LEFT INTERNAL MAMMARY ARTERY AND RIGHT GREATER SAPHENOUS VEIN HARVESTED ENDOSCOPICALLY. LIMA TO LAD, SVG TO DIAG, SVG TO OM, SVG TO PDA.;  Surgeon: Gaye Pollack, MD;  [facility];  Service: Open Heart Surgery;  Laterality: N/A;   HEMORRHOID SURGERY     LEFT HEART CATH AND CORONARY ANGIOGRAPHY N/A 10/01/2017   Procedure: LEFT HEART CATH AND CORONARY ANGIOGRAPHY;  Surgeon: Martinique, Peter M, MD;  Location: [place] [facility];  Service: Cardiovascular;  Laterality: N/A;   ORIF SHOULDER FRACTURE     76 years old   PROSTATE BIOPSY  03/2016   TEE WITHOUT CARDIOVERSION N/A 10/03/2017   Procedure: TRANSESOPHAGEAL ECHOCARDIOGRAM (TEE);  Surgeon: Gaye Pollack, MD;  Location: [place];  Service: Open Heart Surgery;  Laterality: N/A;   TONSILLECTOMY AND ADENOIDECTOMY     76 years old    Allergies  Allergies  Allergen Reactions   Statins Other (See Comments)    Muscle pain Tried 3 different statins    Home Medications    Prior to Admission medications   Medication Sig Start Date  End Date Taking? Authorizing Provider  acetaminophen (TYLENOL) 325 MG tablet Take 2 tablets (650 mg total) by mouth every 6 (six) hours as needed for mild pain. 10/07/17   Elgie Collard, PA-C  aspirin EC 81 MG tablet Take 81 mg by mouth daily.    [provider]  Calcium Carb-Cholecalciferol 600-800 MG-UNIT TABS Take 1 tablet by mouth daily.     [provider]  Evolocumab (REPATHA SURECLICK) 492 MG/ML SOAJ INJECT 1 PEN INTO THE SKIN EVERY 14 (FOURTEEN) DAYS. 10/10/21   Jerline Pain, MD  fexofenadine (ALLEGRA) 180 MG tablet Take 180 mg by mouth daily.    [provider]  glucosamine-chondroitin 500-400 MG tablet Take 1 tablet by mouth daily.     [provider]  metoprolol tartrate (LOPRESSOR) 25 MG tablet TAKE 0.5 TABLETS BY MOUTH 2 TIMES DAILY. 09/28/21   Marylu Lund., NP  Multiple Vitamin (MULTI-VITAMINS) TABS Take by mouth.    [provider]  nitroGLYCERIN (NITROSTAT) 0.4 MG SL tablet PLACE 1 TABLET UNDER TONGUE EVERY 5 MINUTES AS NEEDED FOR CHEST PAIN. 10/10/21   Jerline Pain, MD  omeprazole (PRILOSEC) 20 MG capsule Take 20 mg by mouth daily before breakfast. 07/26/17   [provider]    Physical Exam    Vital Signs:  Jeff Decker does not have vital signs available for review today.  Given telephonic nature of communication, physical exam is limited. AAOx3. NAD. Normal affect.  Speech and respirations are unlabored.  Accessory Clinical Findings    None  Assessment & Plan    1.  Preoperative Cardiovascular Risk Assessment: RCRI 6.6% indicating moderate CV risk. The patient affirms he has been doing well without any new cardiac symptoms. They are able to achieve over 4 METS exercising regularly without cardiac limitations. Therefore, based on ACC/AHA guidelines, the patient would be at acceptable risk for the planned procedure without further cardiovascular testing. The patient was advised that if he develops new symptoms prior to surgery to contact our office to arrange for a follow-up visit, and he verbalized understanding.  The patient is on ASA given prior remote stent and CABG. The clearance intake did not reflect that the patient needed to hold any medicines but he states that he got a call asking him to hold aspirin from this day forward prior to surgery. As his history falls outside the periop DAPT algorithm, I reached out to Dr. Acie Fredrickson (DOD) since Dr. Marlou Porch is out - per his review patient may hold aspirin  prior to procedure as requested. We typically advise that blood thinners be resumed when felt safe by performing physician.  A copy of this note will be routed to requesting surgeon.  Time:   Today, I have spent 5 minutes with the patient with telehealth technology discussing medical history, symptoms, and management plan.     Charlie Pitter, PA-C  01/02/2022, 3:21 PM

## 2022-01-31 ENCOUNTER — Encounter: Payer: Self-pay | Admitting: Oncology

## 2022-02-07 ENCOUNTER — Inpatient Hospital Stay: Payer: Medicare Other | Admitting: Oncology

## 2022-02-07 ENCOUNTER — Inpatient Hospital Stay: Payer: Medicare Other | Attending: Oncology

## 2022-02-07 ENCOUNTER — Other Ambulatory Visit: Payer: Self-pay

## 2022-02-07 ENCOUNTER — Inpatient Hospital Stay: Payer: Medicare Other

## 2022-02-07 VITALS — BP 108/63 | HR 51 | Temp 97.8°F | Resp 14 | Wt 186.4 lb

## 2022-02-07 DIAGNOSIS — C61 Malignant neoplasm of prostate: Secondary | ICD-10-CM | POA: Diagnosis not present

## 2022-02-07 DIAGNOSIS — Z79899 Other long term (current) drug therapy: Secondary | ICD-10-CM | POA: Diagnosis not present

## 2022-02-07 LAB — CMP (CANCER CENTER ONLY)
ALT: 17 U/L (ref 0–44)
AST: 17 U/L (ref 15–41)
Albumin: 4.6 g/dL (ref 3.5–5.0)
Alkaline Phosphatase: 48 U/L (ref 38–126)
Anion gap: 5 (ref 5–15)
BUN: 18 mg/dL (ref 8–23)
CO2: 30 mmol/L (ref 22–32)
Calcium: 9.7 mg/dL (ref 8.9–10.3)
Chloride: 105 mmol/L (ref 98–111)
Creatinine: 0.97 mg/dL (ref 0.61–1.24)
GFR, Estimated: >60 mL/min (ref >60)
Glucose, Bld: 74 mg/dL (ref 70–99)
Potassium: 4.1 mmol/L (ref 3.5–5.1)
Sodium: 140 mmol/L (ref 135–145)
Total Bilirubin: 0.4 mg/dL (ref 0.3–1.2)
Total Protein: 7.5 g/dL (ref 6.5–8.1)

## 2022-02-07 LAB — CBC WITH DIFFERENTIAL (CANCER CENTER ONLY)
Abs Immature Granulocytes: 0.01 10*3/uL (ref 0.00–0.07)
Basophils Absolute: 0.1 10*3/uL (ref 0.0–0.1)
Basophils Relative: 1 %
Eosinophils Absolute: 0.2 10*3/uL (ref 0.0–0.5)
Eosinophils Relative: 3 %
HCT: 38.6 % — ABNORMAL LOW (ref 39.0–52.0)
Hemoglobin: 13 g/dL (ref 13.0–17.0)
Immature Granulocytes: 0 %
Lymphocytes Relative: 31 %
Lymphs Abs: 1.8 10*3/uL (ref 0.7–4.0)
MCH: 29.3 pg (ref 26.0–34.0)
MCHC: 33.7 g/dL (ref 30.0–36.0)
MCV: 87.1 fL (ref 80.0–100.0)
Monocytes Absolute: 0.7 10*3/uL (ref 0.1–1.0)
Monocytes Relative: 12 %
Neutro Abs: 3.2 10*3/uL (ref 1.7–7.7)
Neutrophils Relative %: 53 %
Platelet Count: 253 10*3/uL (ref 150–400)
RBC: 4.43 MIL/uL (ref 4.22–5.81)
RDW: 12.4 % (ref 11.5–15.5)
WBC Count: 6 10*3/uL (ref 4.0–10.5)
nRBC: 0 % (ref 0.0–0.2)

## 2022-02-07 MED ORDER — LEUPROLIDE ACETATE (4 MONTH) 30 MG ~~LOC~~ KIT
30.0000 mg | PACK | Freq: Once | SUBCUTANEOUS | Status: AC
  Administered 2022-02-07: 30 mg via SUBCUTANEOUS
  Filled 2022-02-07: qty 30

## 2022-02-07 NOTE — Progress Notes (Signed)
Hematology and Oncology Follow Up Visit  Jeff Decker [id number] [birth year] 77 y.o. 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  He presented with PSA of 262 and a Gleason score 4+5 = 9 at that time.   Prior Therapy:  Taxotere chemotherapy at 75 mg/m cycle one given on 06/13/2016. He is S/P 6 cycles concluded on 09/25/2016.   Current therapy: Eligard 30 mg every 4 months.  He will receive injection today and repeated in 4 months.      Interim History: Jeff Decker returns today for repeat evaluation.  Since last visit, he reports feeling well without any major complaints.  Denies any bone pain or pathological fractures.  He denies any arthralgias or myalgias.  His performance status and quality of life remain excellent.  He denies any recent hospitalizations or illnesses.     .      Medications: Updated on review. Current Outpatient Medications  Medication Sig Dispense Refill   acetaminophen (TYLENOL) 325 MG tablet Take 2 tablets (650 mg total) by mouth every 6 (six) hours as needed for mild pain.     aspirin EC 81 MG tablet Take 81 mg by mouth daily.     Calcium Carb-Cholecalciferol 600-800 MG-UNIT TABS Take 1 tablet by mouth daily.      Evolocumab (REPATHA SURECLICK) 622 MG/ML SOAJ INJECT 1 PEN INTO THE SKIN EVERY 14 (FOURTEEN) DAYS. 6 mL 3   fexofenadine (ALLEGRA) 180 MG tablet Take 180 mg by mouth daily.     glucosamine-chondroitin 500-400 MG tablet Take 1 tablet by mouth daily.      metoprolol tartrate (LOPRESSOR) 25 MG tablet TAKE 0.5 TABLETS BY MOUTH 2 TIMES DAILY. 90 tablet 3   Multiple Vitamin (MULTI-VITAMINS) TABS Take by mouth.     nitroGLYCERIN (NITROSTAT) 0.4 MG SL tablet PLACE 1 TABLET UNDER TONGUE EVERY 5 MINUTES AS NEEDED FOR CHEST PAIN. 75 tablet 2   omeprazole (PRILOSEC) 20 MG  capsule Take 20 mg by mouth daily before breakfast.  1   No current facility-administered medications for this visit.     Allergies:  Allergies  Allergen Reactions   Statins Other (See Comments)    Muscle pain Tried 3 different statins       Physical Exam:      Blood pressure 108/63, pulse (!) 51, temperature 97.8 F (36.6 C), temperature source Temporal, resp. rate 14, weight 186 lb 6.4 oz (84.6 kg), SpO2 97 %.      ECOG: 0    General appearance: Alert, awake without any distress. Head: Atraumatic without abnormalities Oropharynx: Without any thrush or ulcers. Eyes: No scleral icterus. Lymph nodes: No lymphadenopathy noted in the cervical, supraclavicular, or axillary nodes Heart:regular rate and rhythm, without any murmurs or gallops.   Lung: Clear to auscultation without any rhonchi, wheezes or dullness to percussion. Abdomin: Soft, nontender without any shifting dullness or ascites. Musculoskeletal: No clubbing or cyanosis. Neurological: No motor or sensory deficits. Skin: No rashes or lesions.              Lab Results: Lab Results  Component Value Date   WBC 6.0 02/07/2022   HGB 13.0 02/07/2022   HCT 38.6 (L) 02/07/2022   MCV 87.1 02/07/2022   PLT 253 02/07/2022     Chemistry      Component Value Date/Time   NA 139 10/11/2021 0835   NA 138 10/22/2017 1211  NA 139 11/20/2016 0800   K 4.2 10/11/2021 0835   K 4.2 11/20/2016 0800   CL 107 10/11/2021 0835   CO2 28 10/11/2021 0835   CO2 24 11/20/2016 0800   BUN 20 10/11/2021 0835   BUN 19 10/22/2017 1211   BUN 16.7 11/20/2016 0800   CREATININE 0.81 10/11/2021 0835   CREATININE 0.8 11/20/2016 0800      Component Value Date/Time   CALCIUM 9.2 10/11/2021 0835   CALCIUM 9.1 11/20/2016 0800   ALKPHOS 50 10/11/2021 0835   ALKPHOS 58 11/20/2016 0800   AST 18 10/11/2021 0835   AST 18 11/20/2016 0800   ALT 18 10/11/2021 0835   ALT 16 11/20/2016 0800   BILITOT 0.3 10/11/2021 0835    BILITOT 0.40 11/20/2016 0800           Latest Reference Range & Units 05/24/20 07:54 09/21/20 08:33 06/14/21 11:29 10/11/21 08:35  Prostate Specific Ag, Serum 0.0 - 4.0 ng/mL 0.1 0.1 0.3 0.2      Impression and Plan:  77 year old man with:   1.  Castration-sensitive advanced prostate cancer with disease to the lymph nodes diagnosed in 2018.    The natural course of his disease was discussed and treatment options were reviewed.  His PSA continues to be very low without any symptomatic progression.  Treatment options in the future including androgen receptor pathway inhibitors, PARP inhibitor, Pluvicto or additional chemotherapy could be considered.  These options would be instituted if he developed castration-resistant disease with a more rapid rise in his PSA or symptoms.  Given the overall excellent control of his PSA I have recommended no additional treatment at this time.  If his PSA starts to rise with a rapid doubling time of less than 6 months, I recommended repeat staging with PSMA PET scan and institute a different salvage therapy likely with oral targeted therapy with abiraterone or enzalutamide.  Apalutamide or darolutamide could also be used as well.   2. Androgen deprivation therapy: He is currently on Eligard which will be continued indefinitely.  Complications and weight gain, hot flashes among others were reiterated.   3. Bone directed therapy: He is to continue calcium and vitamin D supplements.  Bone directed therapy has been deferred for the time being.  4.  Prognosis and goals of care: His disease is incurable although aggressive measures are warranted given his excellent performance status.  5. Follow-up: In 4 months for a follow-up.  30  minutes were spent on this encounter.  The time was dedicated to updating disease status, treatment choices and outlining future plan of care discussion.    Zola Button, MD 1/10/20248:15 AM

## 2022-02-07 NOTE — Patient Instructions (Signed)
Leuprolide Suspension for Injection (Prostate Cancer) What is this medication? LEUPROLIDE (loo PROE lide) reduces the symptoms of prostate cancer. It works by decreasing levels of the hormone testosterone in the body. This prevents prostate cancer cells from spreading or growing. This medicine may be used for other purposes; ask your health care provider or pharmacist if you have questions. COMMON BRAND NAME(S): Eligard, Lupron Depot, Lupron Depot-Ped, Lutrate Depot, Viadur What should I tell my care team before I take this medication? They need to know if you have any of these conditions: Diabetes Heart disease Heart failure High or low levels of electrolytes, such as magnesium, potassium, or sodium in your blood Irregular heartbeat or rhythm Seizures An unusual or allergic reaction to leuprolide, other medications, foods, dyes, or preservatives Pregnant or trying to get pregnant Breast-feeding How should I use this medication? This medication is injected under the skin or into a muscle. It is given by your care team in a hospital or clinic setting. Talk to your care team about the use of this medication in children. Special care may be needed. Overdosage: If you think you have taken too much of this medicine contact a poison control center or emergency room at once. NOTE: This medicine is only for you. Do not share this medicine with others. What if I miss a dose? Keep appointments for follow-up doses. It is important not to miss your dose. Call your care team if you are unable to keep an appointment. What may interact with this medication? Do not take this medication with any of the following: Cisapride Dronedarone Ketoconazole Levoketoconazole Pimozide Thioridazine This medication may also interact with the following: Other medications that cause heart rhythm changes This list may not describe all possible interactions. Give your health care provider a list of all the medicines,  herbs, non-prescription drugs, or dietary supplements you use. Also tell them if you smoke, drink alcohol, or use illegal drugs. Some items may interact with your medicine. What should I watch for while using this medication? Visit your care team for regular checks on your progress. Tell your care team if your symptoms do not start to get better or if they get worse. This medication may increase blood sugar. The risk may be higher in patients who already have diabetes. Ask your care team what you can do to lower the risk of diabetes while taking this medication. This medication may cause infertility. Talk to your care team if you are concerned about your fertility. Heart attacks and strokes have been reported with the use of this medication. Get emergency help if you develop signs or symptoms of a heart attack or stroke. Talk to your care team about the risks and benefits of this medication. What side effects may I notice from receiving this medication? Side effects that you should report to your care team as soon as possible: Allergic reactions--skin rash, itching, hives, swelling of the face, lips, tongue, or throat Heart attack--pain or tightness in the chest, shoulders, arms, or jaw, nausea, shortness of breath, cold or clammy skin, feeling faint or lightheaded Heart rhythm changes--fast or irregular heartbeat, dizziness, feeling faint or lightheaded, chest pain, trouble breathing High blood sugar (hyperglycemia)--increased thirst or amount of urine, unusual weakness or fatigue, blurry vision Mood swings, irritability, hostility Seizures Stroke--sudden numbness or weakness of the face, arm, or leg, trouble speaking, confusion, trouble walking, loss of balance or coordination, dizziness, severe headache, change in vision Thoughts of suicide or self-harm, worsening mood, feelings of depression Side   effects that usually do not require medical attention (report to your care team if they continue or  are bothersome): Bone pain Change in sex drive or performance General discomfort and fatigue Hot flashes Muscle pain Pain, redness, or irritation at injection site Swelling of the ankles, hands, or feet This list may not describe all possible side effects. Call your doctor for medical advice about side effects. You may report side effects to FDA at 1-800-FDA-1088. Where should I keep my medication? This medication is given in a hospital or clinic. It will not be stored at home. NOTE: This sheet is a summary. It may not cover all possible information. If you have questions about this medicine, talk to your doctor, pharmacist, or health care provider.  2023 Elsevier/Gold Standard (2021-03-29 00:00:00)  

## 2022-02-08 ENCOUNTER — Telehealth: Payer: Self-pay | Admitting: *Deleted

## 2022-02-08 LAB — PROSTATE-SPECIFIC AG, SERUM (LABCORP): Prostate Specific Ag, Serum: 0.2 ng/mL (ref 0.0–4.0)

## 2022-02-08 NOTE — Telephone Encounter (Signed)
-----   Message from Wyatt Portela, MD sent at 02/08/2022  9:10 AM EST ----- Please let him know his PSA is still low

## 2022-02-08 NOTE — Telephone Encounter (Signed)
LM with note below 

## 2022-05-07 ENCOUNTER — Encounter: Payer: Self-pay | Admitting: Hematology and Oncology

## 2022-05-29 ENCOUNTER — Encounter: Payer: Self-pay | Admitting: Hematology and Oncology

## 2022-06-05 ENCOUNTER — Other Ambulatory Visit: Payer: Self-pay | Admitting: Physician Assistant

## 2022-06-05 DIAGNOSIS — C61 Malignant neoplasm of prostate: Secondary | ICD-10-CM

## 2022-06-06 ENCOUNTER — Other Ambulatory Visit: Payer: Self-pay

## 2022-06-06 ENCOUNTER — Inpatient Hospital Stay (HOSPITAL_BASED_OUTPATIENT_CLINIC_OR_DEPARTMENT_OTHER): Payer: Medicare Other | Admitting: Physician Assistant

## 2022-06-06 ENCOUNTER — Inpatient Hospital Stay: Payer: Medicare Other | Attending: Physician Assistant

## 2022-06-06 ENCOUNTER — Other Ambulatory Visit: Payer: BC Managed Care – PPO

## 2022-06-06 ENCOUNTER — Inpatient Hospital Stay: Payer: Medicare Other

## 2022-06-06 ENCOUNTER — Ambulatory Visit: Payer: BC Managed Care – PPO | Admitting: Hematology and Oncology

## 2022-06-06 ENCOUNTER — Ambulatory Visit: Payer: BC Managed Care – PPO

## 2022-06-06 VITALS — BP 109/68 | HR 50 | Temp 97.7°F | Resp 15 | Ht 71.0 in | Wt 183.9 lb

## 2022-06-06 VITALS — BP 117/62 | HR 50 | Temp 97.9°F | Resp 16

## 2022-06-06 DIAGNOSIS — C61 Malignant neoplasm of prostate: Secondary | ICD-10-CM | POA: Insufficient documentation

## 2022-06-06 DIAGNOSIS — Z79899 Other long term (current) drug therapy: Secondary | ICD-10-CM | POA: Insufficient documentation

## 2022-06-06 DIAGNOSIS — M25552 Pain in left hip: Secondary | ICD-10-CM

## 2022-06-06 DIAGNOSIS — C7951 Secondary malignant neoplasm of bone: Secondary | ICD-10-CM | POA: Diagnosis not present

## 2022-06-06 LAB — CBC WITH DIFFERENTIAL (CANCER CENTER ONLY)
Abs Immature Granulocytes: 0.01 10*3/uL (ref 0.00–0.07)
Basophils Absolute: 0.1 10*3/uL (ref 0.0–0.1)
Basophils Relative: 1 %
Eosinophils Absolute: 0.1 10*3/uL (ref 0.0–0.5)
Eosinophils Relative: 2 %
HCT: 37.7 % — ABNORMAL LOW (ref 39.0–52.0)
Hemoglobin: 12.7 g/dL — ABNORMAL LOW (ref 13.0–17.0)
Immature Granulocytes: 0 %
Lymphocytes Relative: 31 %
Lymphs Abs: 1.7 10*3/uL (ref 0.7–4.0)
MCH: 29.1 pg (ref 26.0–34.0)
MCHC: 33.7 g/dL (ref 30.0–36.0)
MCV: 86.5 fL (ref 80.0–100.0)
Monocytes Absolute: 0.7 10*3/uL (ref 0.1–1.0)
Monocytes Relative: 12 %
Neutro Abs: 3 10*3/uL (ref 1.7–7.7)
Neutrophils Relative %: 54 %
Platelet Count: 253 10*3/uL (ref 150–400)
RBC: 4.36 MIL/uL (ref 4.22–5.81)
RDW: 12.4 % (ref 11.5–15.5)
WBC Count: 5.6 10*3/uL (ref 4.0–10.5)
nRBC: 0 % (ref 0.0–0.2)

## 2022-06-06 LAB — CMP (CANCER CENTER ONLY)
ALT: 16 U/L (ref 0–44)
AST: 18 U/L (ref 15–41)
Albumin: 4.4 g/dL (ref 3.5–5.0)
Alkaline Phosphatase: 56 U/L (ref 38–126)
Anion gap: 5 (ref 5–15)
BUN: 15 mg/dL (ref 8–23)
CO2: 30 mmol/L (ref 22–32)
Calcium: 9.2 mg/dL (ref 8.9–10.3)
Chloride: 105 mmol/L (ref 98–111)
Creatinine: 0.82 mg/dL (ref 0.61–1.24)
GFR, Estimated: >60 mL/min (ref >60)
Glucose, Bld: 111 mg/dL — ABNORMAL HIGH (ref 70–99)
Potassium: 4.8 mmol/L (ref 3.5–5.1)
Sodium: 140 mmol/L (ref 135–145)
Total Bilirubin: 0.6 mg/dL (ref 0.3–1.2)
Total Protein: 7.1 g/dL (ref 6.5–8.1)

## 2022-06-06 MED ORDER — LEUPROLIDE ACETATE (4 MONTH) 30 MG ~~LOC~~ KIT
30.0000 mg | PACK | Freq: Once | SUBCUTANEOUS | Status: AC
  Administered 2022-06-06: 30 mg via SUBCUTANEOUS
  Filled 2022-06-06: qty 30

## 2022-06-06 NOTE — Progress Notes (Signed)
[facility] [phone]   [phone]  FOLLOW UP NOTE  Patient Care Team: [facility] as [facility]CP - General (Family Medicine) Jake Bathe, MD as PCP - Cardiology (Cardiology)  Hematological/Oncological History Found to have elevated PSA of 262 at routine visit. 04/30/2016: Underwent prostate biopsy that showed Gleason score 4+5 = 9 in one core and multiple cores of Gleason 8.  05/23/2016: Bone scan: metastatic disease to the bone including right pelvis, L1 and ribs.  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 male returns for a follow up for advanced prostate cancer. He was last seen by Dr. Clelia Croft on 02/07/2022. He is unaccompanied for this visit.   On exam today, Jeff Decker reports that he is tolerating treatment without any significant limitations. He does continue to have intermittent episodes of left hip pain that has been present for the past year or so. He reports that the pain is aggravated when he rides his horse. He denies any changes to his gait or ambulation. He denies nausea, vomiting or abdominal pain. His bowel habits are unchanged without recurrent episodes of diarrhea or constipation. He denies easy bruising or signs of active bleeding. He denies fevers, chills, sweats, shortness of breath, chest pain, cough, peripheral edema or neuropathy. He has no other complaints. Rest of the 10 point ROS is below.   MEDICAL HISTORY:  Past Medical History:  Diagnosis Date   Allergy    Arthritis     Cancer (HCC) 03/2016   prostate and bone , chemo completed   Coronary artery disease    GERD (gastroesophageal reflux disease)    Hyperlipidemia    Myocardial infarction (HCC) 1989    SURGICAL HISTORY: Past Surgical History:  Procedure Laterality Date   COLONOSCOPY  2006   CORONARY ANGIOPLASTY WITH STENT PLACEMENT     CORONARY ARTERY BYPASS GRAFT N/A 10/03/2017   Procedure: CORONARY ARTERY BYPASS GRAFTING (CABG) x 4, USING LEFT INTERNAL MAMMARY ARTERY AND RIGHT GREATER SAPHENOUS VEIN HARVESTED ENDOSCOPICALLY. LIMA TO LAD, SVG TO DIAG, SVG TO OM, SVG TO PDA.;  Surgeon: Alleen Borne, MD;  Location: [facility] OR;  Service: Open Heart Surgery;  Laterality: N/A;   HEMORRHOID SURGERY     LEFT HEART CATH AND CORONARY ANGIOGRAPHY N/A 10/01/2017   Procedure: LEFT HEART CATH AND CORONARY ANGIOGRAPHY;  Surgeon: Swaziland, Peter M, MD;  Location: [facility] INVASIVE CV LAB;  Service: Cardiovascular;  Laterality: N/A;   ORIF SHOULDER FRACTURE     77 years old   PROSTATE BIOPSY  03/2016   TEE WITHOUT CARDIOVERSION N/A 10/03/2017   Procedure: TRANSESOPHAGEAL ECHOCARDIOGRAM (TEE);  Surgeon: Alleen Borne, MD;  Location: [facility];  Service: Open Heart Surgery;  Laterality: N/A;   TONSILLECTOMY AND ADENOIDECTOMY     77 years old    SOCIAL HISTORY: Social History   Socioeconomic History   Marital status: Widowed    Spouse name: Not on file   Number of children: Not on file   Years of education: Not on file   Highest education level: High school graduate  Occupational History   Not on file  Tobacco Use   Smoking status: Former  Types: Cigarettes    Quit date: 08/01/1986    Years since quitting: 35.8   Smokeless tobacco: Never  Vaping Use   Vaping Use: Never used  Substance and Sexual Activity   Alcohol use: Yes    Alcohol/week: 4.0 standard drinks of alcohol    Types: 4 Cans of beer per week   Drug use: Never   Sexual activity: Not on file  Other Topics Concern   Not on file  Social History Narrative    Not on file   Social Determinants of Health   Financial Resource Strain: Low Risk  (12/12/2017)   Overall Financial Resource Strain (CARDIA)    Difficulty of Paying Living Expenses: Not hard at all  Food Insecurity: No Food Insecurity (12/12/2017)   Hunger Vital Sign    Worried About Running Out of Food in the Last Year: Never true    Ran Out of Food in the Last Year: Never true  Transportation Needs: No Transportation Needs (12/12/2017)   PRAPARE - Administrator, Civil Service (Medical): No    Lack of Transportation (Non-Medical): No  Physical Activity: Insufficiently Active (12/12/2017)   Exercise Vital Sign    Days of Exercise per Week: 4 days    Minutes of Exercise per Session: 30 min  Stress: No Stress Concern Present (12/12/2017)   [employer] of Occupational Health - Occupational Stress Questionnaire    Feeling of Stress : Not at all  Social Connections: Not on file  Intimate Partner Violence: Not on file    FAMILY HISTORY: Family History  Problem Relation Age of Onset   Breast cancer Mother    Colon cancer Father 34   Prostate cancer Father    Stomach cancer Neg Hx    Esophageal cancer Neg Hx    Rectal cancer Neg Hx     ALLERGIES:  is allergic to statins.  MEDICATIONS:  Current Outpatient Medications  Medication Sig Dispense Refill   acetaminophen (TYLENOL) 325 MG tablet Take 2 tablets (650 mg total) by mouth every 6 (six) hours as needed for mild pain.     aspirin EC 81 MG tablet Take 81 mg by mouth daily.     Calcium Carb-Cholecalciferol 600-800 MG-UNIT TABS Take 1 tablet by mouth daily.      Evolocumab (REPATHA SURECLICK) 140 MG/ML SOAJ INJECT 1 PEN INTO THE SKIN EVERY 14 (FOURTEEN) DAYS. 6 mL 3   fexofenadine (ALLEGRA) 180 MG tablet Take 180 mg by mouth daily.     glucosamine-chondroitin 500-400 MG tablet Take 1 tablet by mouth daily.      metoprolol tartrate (LOPRESSOR) 25 MG tablet TAKE 0.5 TABLETS BY MOUTH 2 TIMES DAILY. 90 tablet 3    Multiple Vitamin (MULTI-VITAMINS) TABS Take by mouth.     nitroGLYCERIN (NITROSTAT) 0.4 MG SL tablet PLACE 1 TABLET UNDER TONGUE EVERY 5 MINUTES AS NEEDED FOR CHEST PAIN. 75 tablet 2   omeprazole (PRILOSEC) 20 MG capsule Take 20 mg by mouth daily before breakfast.  1   No current facility-administered medications for this visit.    REVIEW OF SYSTEMS:   Constitutional: ( - ) fevers, ( - )  chills , ( - ) night sweats Eyes: ( - ) blurriness of vision, ( - ) double vision, ( - ) watery eyes Ears, nose, mouth, throat, and face: ( - ) mucositis, ( - ) sore throat Respiratory: ( - ) cough, ( - ) dyspnea, ( - ) wheezes Cardiovascular: ( - ) palpitation, ( - )  chest discomfort, ( - ) lower extremity swelling Gastrointestinal:  ( - ) nausea, ( - ) heartburn, ( - ) change in bowel habits Skin: ( - ) abnormal skin rashes Lymphatics: ( - ) new lymphadenopathy, ( - ) easy bruising Neurological: ( - ) numbness, ( - ) tingling, ( - ) new weaknesses Behavioral/Psych: ( - ) mood change, ( - ) new changes  All other systems were reviewed with the patient and are negative.  PHYSICAL EXAMINATION: ECOG PERFORMANCE STATUS: 1 - Symptomatic but completely ambulatory  Vitals:   06/06/22 0814  BP: 109/68  Pulse: (!) 50  Resp: 15  Temp: 97.7 F (36.5 C)  SpO2: 99%   Filed Weights   06/06/22 0814  Weight: 183 lb 14.4 oz (83.4 kg)    GENERAL: well appearing male in NAD  SKIN: skin color, texture, turgor are normal, no rashes or significant lesions EYES: conjunctiva are pink and non-injected, sclera clear LUNGS: clear to auscultation and percussion with normal breathing effort HEART: regular rate & rhythm and no murmurs and no lower extremity edema Musculoskeletal: no cyanosis of digits and no clubbing  PSYCH: alert & oriented x 3, fluent speech NEURO: no focal motor/sensory deficits  LABORATORY DATA:  I have reviewed the data as listed    Latest Ref Rng & Units 06/06/2022    7:58 AM 02/07/2022     8:00 AM 10/11/2021    8:35 AM  CBC  WBC 4.0 - 10.5 K/uL 5.6  6.0  5.0   Hemoglobin 13.0 - 17.0 g/dL 16.1  09.6  04.5   Hematocrit 39.0 - 52.0 % 37.7  38.6  34.1   Platelets 150 - 400 K/uL 253  253  250        Latest Ref Rng & Units 06/06/2022    7:58 AM 02/07/2022    8:00 AM 10/11/2021    8:35 AM  CMP  Glucose 70 - 99 mg/dL 409  74  811   BUN 8 - 23 mg/dL 15  18  20    Creatinine 0.61 - 1.24 mg/dL 9.14  7.82  9.56   Sodium 135 - 145 mmol/L 140  140  139   Potassium 3.5 - 5.1 mmol/L 4.8  4.1  4.2   Chloride 98 - 111 mmol/L 105  105  107   CO2 22 - 32 mmol/L 30  30  28    Calcium 8.9 - 10.3 mg/dL 9.2  9.7  9.2   Total Protein 6.5 - 8.1 g/dL 7.1  7.5  6.6   Total Bilirubin 0.3 - 1.2 mg/dL 0.6  0.4  0.3   Alkaline Phos 38 - 126 U/L 56  48  50   AST 15 - 41 U/L 18  17  18    ALT 0 - 44 U/L 16  17  18      ASSESSMENT & PLAN Jeff Decker is a 77 y.o. male who presents to the clinic for follow up for advanced prostate cancer.   # Castration-sensitive advanced prostate cancer involving the pelvic lymph nodes and bones: --Received first firmagon injection in 04/2016 --Received 6 cycles of Taxotere chemotherapy 75 mg/m2 from 06/13/2016-8/28/20218 --Received Lupron injections q 4 months from 01/15/2018-09/18/2018 --Started Eligard injections q 4 months on 01/19/2021 PLAN: --Labs from today were reviewed and adequate for treatment. PSA level pending --Proceed with Eligard injection today --RTC in 4 months with labs, follow up with Dr. Leonides Schanz with repeat Eligard injection  #Left hip pain: --In the setting of previous  bone metastases and last bone scan was in April 2022, we will repeat bone scan to rule out progression.    No orders of the defined types were placed in this encounter.   All questions were answered. The patient knows to call the clinic with any problems, questions or concerns.  I have spent a total of 30 minutes minutes of face-to-face and non-face-to-face time,  preparing to see the patient,performing a medically appropriate examination, counseling and educating the patient, ordering medications/tests/procedures,  documenting clinical information in the electronic health record, and care coordination.   Georga Kaufmann, PA-C Department of Hematology/Oncology [facility] Phone: [phone]

## 2022-06-08 ENCOUNTER — Telehealth: Payer: Self-pay

## 2022-06-08 LAB — PROSTATE-SPECIFIC AG, SERUM (LABCORP): Prostate Specific Ag, Serum: 0.2 ng/mL (ref 0.0–4.0)

## 2022-06-08 NOTE — Telephone Encounter (Signed)
-----   Message from Briant Cedar, PA-C sent at 06/08/2022  9:24 AM EDT ----- Please notify patient PSA levels are normal.   ----- Message ----- From: Interface, Lab In [facility] Sent: 06/06/2022   8:09 AM EDT To: Briant Cedar, PA-C

## 2022-06-08 NOTE — Telephone Encounter (Signed)
Pt advised with VU 

## 2022-06-19 ENCOUNTER — Encounter: Payer: Self-pay | Admitting: Gastroenterology

## 2022-06-19 ENCOUNTER — Other Ambulatory Visit (HOSPITAL_COMMUNITY): Payer: Medicare Other

## 2022-06-20 ENCOUNTER — Encounter (HOSPITAL_COMMUNITY)
Admission: RE | Admit: 2022-06-20 | Discharge: 2022-06-20 | Disposition: A | Discharge status: Home / Self Care | Payer: Medicare Other | Source: Ambulatory Visit | Attending: Physician Assistant | Admitting: Physician Assistant

## 2022-06-20 DIAGNOSIS — C61 Malignant neoplasm of prostate: Secondary | ICD-10-CM

## 2022-06-20 DIAGNOSIS — M25552 Pain in left hip: Secondary | ICD-10-CM

## 2022-06-20 MED ORDER — TECHNETIUM TC 99M MEDRONATE IV KIT
20.0000 | PACK | Freq: Once | INTRAVENOUS | Status: AC | PRN
  Administered 2022-06-20: 21 via INTRAVENOUS

## 2022-07-04 ENCOUNTER — Telehealth: Payer: Self-pay | Admitting: *Deleted

## 2022-07-04 NOTE — Telephone Encounter (Signed)
Received call from pt inquiring about bone survey results. Georga Kaufmann, PA made aware.

## 2022-07-05 ENCOUNTER — Telehealth: Payer: Self-pay | Admitting: Physician Assistant

## 2022-07-05 ENCOUNTER — Other Ambulatory Visit: Payer: Self-pay | Admitting: Physician Assistant

## 2022-07-05 DIAGNOSIS — R948 Abnormal results of function studies of other organs and systems: Secondary | ICD-10-CM

## 2022-07-05 NOTE — Telephone Encounter (Signed)
I called [name] to review the bone scan results from 06/20/2022. There was a focus of uptake in the right humeral head that was no present in April 2022 study. Xray of shoulder was recommended and ordered.   No other areas of abnormal uptake was identified. Patient expressed understanding and will plan to undergo xray imaging.

## 2022-07-06 ENCOUNTER — Ambulatory Visit (HOSPITAL_COMMUNITY)
Admission: RE | Admit: 2022-07-06 | Discharge: 2022-07-06 | Disposition: A | Discharge status: Home / Self Care | Payer: Medicare Other | Source: Ambulatory Visit | Attending: Physician Assistant | Admitting: Physician Assistant

## 2022-07-06 DIAGNOSIS — R948 Abnormal results of function studies of other organs and systems: Secondary | ICD-10-CM | POA: Diagnosis present

## 2022-07-13 ENCOUNTER — Telehealth: Payer: Self-pay

## 2022-07-13 NOTE — Telephone Encounter (Signed)
-----   Message from Briant Cedar, PA-C sent at 07/13/2022  9:21 AM EDT ----- Please notify patient that xray was negative.  ----- Message ----- From: Interface, Rad Results In Sent: 07/13/2022   8:47 AM EDT To: Briant Cedar, PA-C

## 2022-07-13 NOTE — Telephone Encounter (Signed)
Pt advised with VU 

## 2022-08-15 ENCOUNTER — Encounter: Payer: Self-pay | Admitting: Hematology and Oncology

## 2022-08-15 ENCOUNTER — Telehealth: Payer: Self-pay

## 2022-08-15 ENCOUNTER — Other Ambulatory Visit (HOSPITAL_COMMUNITY): Payer: Self-pay

## 2022-08-15 NOTE — Telephone Encounter (Signed)
Pharmacy Patient Advocate Encounter   Received notification from CoverMyMeds that prior authorization for REPATHA is required/requested.   Insurance verification completed.   The patient is insured through [facility] .   Per test claim: PA submitted to [facility] via CoverMyMeds Key/confirmation #/EOC [id number]           Status is pending

## 2022-08-16 NOTE — Telephone Encounter (Signed)
Pharmacy Patient Advocate Encounter  Received notification from [facility] that Prior Authorization for REPATHA has been APPROVED from 08/15/22 to 08/15/23.Marland Kitchen

## 2022-09-22 ENCOUNTER — Other Ambulatory Visit: Payer: Self-pay | Admitting: Nurse Practitioner

## 2022-10-10 ENCOUNTER — Other Ambulatory Visit: Payer: Self-pay | Admitting: Hematology and Oncology

## 2022-10-10 ENCOUNTER — Inpatient Hospital Stay: Payer: Medicare Other

## 2022-10-10 ENCOUNTER — Inpatient Hospital Stay: Payer: Medicare Other | Attending: Hematology and Oncology

## 2022-10-10 ENCOUNTER — Inpatient Hospital Stay: Payer: Medicare Other | Admitting: Hematology and Oncology

## 2022-10-10 VITALS — BP 125/70 | HR 48 | Temp 97.8°F | Resp 17 | Wt 189.8 lb

## 2022-10-10 DIAGNOSIS — C7951 Secondary malignant neoplasm of bone: Secondary | ICD-10-CM | POA: Diagnosis not present

## 2022-10-10 DIAGNOSIS — M25552 Pain in left hip: Secondary | ICD-10-CM | POA: Diagnosis not present

## 2022-10-10 DIAGNOSIS — C61 Malignant neoplasm of prostate: Secondary | ICD-10-CM

## 2022-10-10 DIAGNOSIS — Z79899 Other long term (current) drug therapy: Secondary | ICD-10-CM | POA: Insufficient documentation

## 2022-10-10 LAB — CBC WITH DIFFERENTIAL (CANCER CENTER ONLY)
Abs Immature Granulocytes: 0.01 10*3/uL (ref 0.00–0.07)
Basophils Absolute: 0.1 10*3/uL (ref 0.0–0.1)
Basophils Relative: 1 %
Eosinophils Absolute: 0.2 10*3/uL (ref 0.0–0.5)
Eosinophils Relative: 3 %
HCT: 37.5 % — ABNORMAL LOW (ref 39.0–52.0)
Hemoglobin: 12.4 g/dL — ABNORMAL LOW (ref 13.0–17.0)
Immature Granulocytes: 0 %
Lymphocytes Relative: 30 %
Lymphs Abs: 1.4 10*3/uL (ref 0.7–4.0)
MCH: 29 pg (ref 26.0–34.0)
MCHC: 33.1 g/dL (ref 30.0–36.0)
MCV: 87.8 fL (ref 80.0–100.0)
Monocytes Absolute: 0.5 10*3/uL (ref 0.1–1.0)
Monocytes Relative: 11 %
Neutro Abs: 2.7 10*3/uL (ref 1.7–7.7)
Neutrophils Relative %: 55 %
Platelet Count: 238 10*3/uL (ref 150–400)
RBC: 4.27 MIL/uL (ref 4.22–5.81)
RDW: 12.3 % (ref 11.5–15.5)
WBC Count: 4.9 10*3/uL (ref 4.0–10.5)
nRBC: 0 % (ref 0.0–0.2)

## 2022-10-10 LAB — CMP (CANCER CENTER ONLY)
ALT: 25 U/L (ref 0–44)
AST: 20 U/L (ref 15–41)
Albumin: 4.3 g/dL (ref 3.5–5.0)
Alkaline Phosphatase: 55 U/L (ref 38–126)
Anion gap: 5 (ref 5–15)
BUN: 13 mg/dL (ref 8–23)
CO2: 29 mmol/L (ref 22–32)
Calcium: 9.1 mg/dL (ref 8.9–10.3)
Chloride: 108 mmol/L (ref 98–111)
Creatinine: 0.8 mg/dL (ref 0.61–1.24)
GFR, Estimated: >60 mL/min (ref >60)
Glucose, Bld: 81 mg/dL (ref 70–99)
Potassium: 4 mmol/L (ref 3.5–5.1)
Sodium: 142 mmol/L (ref 135–145)
Total Bilirubin: 0.6 mg/dL (ref 0.3–1.2)
Total Protein: 7 g/dL (ref 6.5–8.1)

## 2022-10-10 MED ORDER — LEUPROLIDE ACETATE (4 MONTH) 30 MG ~~LOC~~ KIT
30.0000 mg | PACK | Freq: Once | SUBCUTANEOUS | Status: AC
  Administered 2022-10-10: 30 mg via SUBCUTANEOUS
  Filled 2022-10-10: qty 30

## 2022-10-10 NOTE — Progress Notes (Signed)
[facility] [phone]   [phone]  FOLLOW UP NOTE  Patient Care Team: [facility] as PCP - General (Family Medicine) Jake Bathe, MD as PCP - Cardiology (Cardiology)  Hematological/Oncological History Found to have elevated PSA of 262 at routine visit. 04/30/2016: Underwent prostate biopsy that showed Gleason score 4+5 = 9 in one core and multiple cores of Gleason 8.  05/23/2016: Bone scan: metastatic disease to the bone including right pelvis, L1 and ribs.  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 male returns for a follow up for advanced prostate cancer. He was last seen on 06/06/2022. He is unaccompanied for this visit.   On exam today, Jeff Decker reports he tolerates his Eligard shot quite well.  He does have some occasional hot flashes and sweats but it does not occur every day.  He reports it does occur on occasion at night.  He notes his energy levels are down from where they were previously but he is still working on the farm.  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  He reports that his left hip has been fine with no recent issues.  He does have some occasional abdominal cramps.  He reports that he is on Repatha for his cholesterol and metoprolol for his heart rate.  He is not having any lightheadedness, dizziness, or shortness of breath.  He has had no big ups or downs or major changes in his health.  He is had no infectious symptoms.  He does enjoy eating red meat and eats  it about every third day.  He denies fevers, chills, sweats, shortness of breath, chest pain, cough, peripheral edema or neuropathy. He has no other complaints. Rest of the 10 point ROS is below.   MEDICAL HISTORY:  Past Medical History:  Diagnosis Date   Allergy    Arthritis    Cancer (HCC) 03/2016   prostate and bone , chemo completed   Coronary artery disease    GERD (gastroesophageal reflux disease)    Hyperlipidemia    Myocardial infarction (HCC) 1989    SURGICAL HISTORY: Past Surgical History:  Procedure Laterality Date   COLONOSCOPY  2006   CORONARY ANGIOPLASTY WITH STENT PLACEMENT     CORONARY ARTERY BYPASS GRAFT N/A 10/03/2017   Procedure: CORONARY ARTERY BYPASS GRAFTING (CABG) x 4, USING LEFT INTERNAL MAMMARY ARTERY AND RIGHT GREATER SAPHENOUS VEIN HARVESTED ENDOSCOPICALLY. LIMA TO LAD, SVG TO DIAG, SVG TO OM, SVG TO PDA.;  Surgeon: Alleen Borne, MD;  Location: [facility] OR;  Service: Open Heart Surgery;  Laterality: N/A;   HEMORRHOID SURGERY     LEFT HEART CATH AND CORONARY ANGIOGRAPHY N/A 10/01/2017   Procedure: LEFT HEART CATH AND CORONARY ANGIOGRAPHY;  Surgeon: [place], Peter M, MD;  Location: [facility] INVASIVE CV LAB;  Service: Cardiovascular;  Laterality: N/A;   ORIF SHOULDER FRACTURE     77 years old   PROSTATE BIOPSY  03/2016   TEE WITHOUT CARDIOVERSION N/A 10/03/2017   Procedure: TRANSESOPHAGEAL ECHOCARDIOGRAM (TEE);  Surgeon: Alleen Borne, MD;  Location: [facility] OR;  Service: Open Heart Surgery;  Laterality: N/A;   TONSILLECTOMY AND ADENOIDECTOMY  77 years old    SOCIAL HISTORY: Social History   Socioeconomic History   Marital status: Widowed    Spouse name: Not on file   Number of children: Not on file   Years of education: Not on file   Highest education level: High school graduate  Occupational History   Not on file  Tobacco Use   Smoking status: Former    Current packs/day: 0.00    Types: Cigarettes    Quit date: 08/01/1986    Years since quitting: 36.2    Smokeless tobacco: Never  Vaping Use   Vaping status: Never Used  Substance and Sexual Activity   Alcohol use: Yes    Alcohol/week: 4.0 standard drinks of alcohol    Types: 4 Cans of beer per week   Drug use: Never   Sexual activity: Not on file  Other Topics Concern   Not on file  Social History Narrative   Not on file   Social Determinants of Health   Financial Resource Strain: Low Risk  (12/12/2017)   Overall Financial Resource Strain (CARDIA)    Difficulty of Paying Living Expenses: Not hard at all  Food Insecurity: No Food Insecurity (12/12/2017)   Hunger Vital Sign    Worried About Running Out of Food in the Last Year: Never true    Ran Out of Food in the Last Year: Never true  Transportation Needs: No Transportation Needs (12/12/2017)   PRAPARE - Administrator, Civil Service (Medical): No    Lack of Transportation (Non-Medical): No  Physical Activity: Insufficiently Active (12/12/2017)   Exercise Vital Sign    Days of Exercise per Week: 4 days    Minutes of Exercise per Session: 30 min  Stress: No Stress Concern Present (12/12/2017)   [employer] of Occupational Health - Occupational Stress Questionnaire    Feeling of Stress : Not at all  Social Connections: Not on file  Intimate Partner Violence: Not on file    FAMILY HISTORY: Family History  Problem Relation Age of Onset   Breast cancer Mother    Colon cancer Father 74   Prostate cancer Father    Stomach cancer Neg Hx    Esophageal cancer Neg Hx    Rectal cancer Neg Hx     ALLERGIES:  is allergic to statins.  MEDICATIONS:  Current Outpatient Medications  Medication Sig Dispense Refill   acetaminophen (TYLENOL) 325 MG tablet Take 2 tablets (650 mg total) by mouth every 6 (six) hours as needed for mild pain.     aspirin EC 81 MG tablet Take 81 mg by mouth daily.     Calcium Carb-Cholecalciferol 600-800 MG-UNIT TABS Take 1 tablet by mouth daily.      Evolocumab (REPATHA SURECLICK) 140  MG/ML SOAJ INJECT 1 PEN INTO THE SKIN EVERY 14 (FOURTEEN) DAYS. 6 mL 3   fexofenadine (ALLEGRA) 180 MG tablet Take 180 mg by mouth daily.     glucosamine-chondroitin 500-400 MG tablet Take 1 tablet by mouth daily.      metoprolol tartrate (LOPRESSOR) 25 MG tablet TAKE 1/2 TABLET TWICE A DAY BY MOUTH 90 tablet 3   Multiple Vitamin (MULTI-VITAMINS) TABS Take by mouth.     nitroGLYCERIN (NITROSTAT) 0.4 MG SL tablet PLACE 1 TABLET UNDER TONGUE EVERY 5 MINUTES AS NEEDED FOR CHEST PAIN. 75 tablet 2   omeprazole (PRILOSEC) 20 MG capsule Take 20 mg by mouth daily before breakfast.  1   No current facility-administered medications for this visit.  Facility-Administered Medications Ordered in Other Visits  Medication Dose Route Frequency Provider Last Rate Last Admin   Leuprolide Acetate (4 Month) (ELIGARD) injection 30 mg  30 mg Subcutaneous Once Thayil, Irene T, PA-C        REVIEW OF SYSTEMS:   Constitutional: ( - ) fevers, ( - )  chills , ( - ) night sweats Eyes: ( - ) blurriness of vision, ( - ) double vision, ( - ) watery eyes Ears, nose, mouth, throat, and face: ( - ) mucositis, ( - ) sore throat Respiratory: ( - ) cough, ( - ) dyspnea, ( - ) wheezes Cardiovascular: ( - ) palpitation, ( - ) chest discomfort, ( - ) lower extremity swelling Gastrointestinal:  ( - ) nausea, ( - ) heartburn, ( - ) change in bowel habits Skin: ( - ) abnormal skin rashes Lymphatics: ( - ) new lymphadenopathy, ( - ) easy bruising Neurological: ( - ) numbness, ( - ) tingling, ( - ) new weaknesses Behavioral/Psych: ( - ) mood change, ( - ) new changes  All other systems were reviewed with the patient and are negative.  PHYSICAL EXAMINATION: ECOG PERFORMANCE STATUS: 1 - Symptomatic but completely ambulatory  Vitals:   10/10/22 0853  BP: 125/70  Pulse: (!) 48  Resp: 17  Temp: 97.8 F (36.6 C)  SpO2: 98%    Filed Weights   10/10/22 0853  Weight: 189 lb 12.8 oz (86.1 kg)     GENERAL: well appearing  male in NAD  SKIN: skin color, texture, turgor are normal, no rashes or significant lesions EYES: conjunctiva are pink and non-injected, sclera clear LUNGS: clear to auscultation and percussion with normal breathing effort HEART: regular rate & rhythm and no murmurs and no lower extremity edema Musculoskeletal: no cyanosis of digits and no clubbing  PSYCH: alert & oriented x 3, fluent speech NEURO: no focal motor/sensory deficits  LABORATORY DATA:  I have reviewed the data as listed    Latest Ref Rng & Units 10/10/2022    8:05 AM 06/06/2022    7:58 AM 02/07/2022    8:00 AM  CBC  WBC 4.0 - 10.5 K/uL 4.9  5.6  6.0   Hemoglobin 13.0 - 17.0 g/dL 60.4  54.0  98.1   Hematocrit 39.0 - 52.0 % 37.5  37.7  38.6   Platelets 150 - 400 K/uL 238  253  253        Latest Ref Rng & Units 10/10/2022    8:05 AM 06/06/2022    7:58 AM 02/07/2022    8:00 AM  CMP  Glucose 70 - 99 mg/dL 81  191  74   BUN 8 - 23 mg/dL 13  15  18    Creatinine 0.61 - 1.24 mg/dL 4.78  2.95  6.21   Sodium 135 - 145 mmol/L 142  140  140   Potassium 3.5 - 5.1 mmol/L 4.0  4.8  4.1   Chloride 98 - 111 mmol/L 108  105  105   CO2 22 - 32 mmol/L 29  30  30    Calcium 8.9 - 10.3 mg/dL 9.1  9.2  9.7   Total Protein 6.5 - 8.1 g/dL 7.0  7.1  7.5   Total Bilirubin 0.3 - 1.2 mg/dL 0.6  0.6  0.4   Alkaline Phos 38 - 126 U/L 55  56  48   AST 15 - 41 U/L 20  18  17    ALT 0 - 44 U/L 25  16  17     ASSESSMENT & PLAN Jeff Decker is a 77 y.o. male who presents to the clinic for follow up for advanced prostate cancer.   # Castration-sensitive advanced prostate cancer involving the pelvic lymph nodes and bones: --Received first firmagon injection in 04/2016 --Received 6 cycles of Taxotere chemotherapy 75 mg/m2 from 06/13/2016-8/28/20218 --Received Lupron injections q 4 months from 01/15/2018-09/18/2018 --Started Eligard injections q 4 months on 01/19/2021 PLAN: --Labs from today show white blood cell 4.9, hemoglobin 12.4, MCV 87.8,  and platelets of 238 -- last PSA at 0.2 on 06/06/2022. Testosterone level pending.  --Proceed with Eligard injection today --RTC in 4 months with labs, follow up with repeat Eligard injection  #Left hip pain: --In the setting of previous bone metastases and last bone scan was in May 2024 showed no evidence of bone progression. --Patient reports pain is under good control today and is not causing any issues.   No orders of the defined types were placed in this encounter.   All questions were answered. The patient knows to call the clinic with any problems, questions or concerns.  I have spent a total of 30 minutes minutes of face-to-face and non-face-to-face time, preparing to see the patient,performing a medically appropriate examination, counseling and educating the patient, ordering medications/tests/procedures,  documenting clinical information in the electronic health record, and care coordination.   Jeff Barns, MD Department of Hematology/Oncology [facility] Phone: [phone] Pager: [phone] Email: [email or web address]

## 2022-10-11 LAB — TESTOSTERONE: Testosterone: <3 ng/dL — ABNORMAL LOW (ref 264–916)

## 2022-10-11 LAB — PROSTATE-SPECIFIC AG, SERUM (LABCORP): Prostate Specific Ag, Serum: 0.3 ng/mL (ref 0.0–4.0)

## 2022-10-12 ENCOUNTER — Telehealth: Payer: Self-pay | Admitting: *Deleted

## 2022-10-12 NOTE — Telephone Encounter (Signed)
TCT patient regarding recent lab results.  No answer  but was able to leave vm message for pt to return this call next Monday to review his PSA results.

## 2022-10-12 NOTE — Telephone Encounter (Signed)
-----   Message from Ulysees Barns IV sent at 10/11/2022  9:05 AM EDT ----- Please let Mr. Feik know that his PSA is stable at 0.3.  His prior PSA was 0.2.  We will plan to see him back in January as scheduled. ----- Message ----- From: Leory Plowman, Lab In [place] Sent: 10/10/2022   8:19 AM EDT To: Jaci Standard, MD

## 2022-10-15 ENCOUNTER — Telehealth: Payer: Self-pay | Admitting: *Deleted

## 2022-10-15 NOTE — Telephone Encounter (Signed)
-----   Message from Jeff Decker sent at 10/11/2022  9:05 AM EDT ----- Please let Mr. Dufrene know that his PSA is stable at 0.3.  His prior PSA was 0.2.  We will plan to see him back in January as scheduled. ----- Message ----- From: Leory Plowman, Lab In [place] Sent: 10/10/2022   8:19 AM EDT To: Jaci Standard, MD

## 2022-10-15 NOTE — Telephone Encounter (Signed)
TCT patient regarding recent lab results.  No answer but was able to leave vm message for pt to return this call at his convenience to [phone]

## 2022-10-20 ENCOUNTER — Other Ambulatory Visit: Payer: Self-pay | Admitting: Cardiology

## 2022-10-20 DIAGNOSIS — E785 Hyperlipidemia, unspecified: Secondary | ICD-10-CM

## 2022-10-20 DIAGNOSIS — I2510 Atherosclerotic heart disease of native coronary artery without angina pectoris: Secondary | ICD-10-CM

## 2022-10-20 DIAGNOSIS — I200 Unstable angina: Secondary | ICD-10-CM

## 2022-11-08 ENCOUNTER — Telehealth: Payer: Self-pay | Admitting: Cardiology

## 2022-11-08 NOTE — Telephone Encounter (Signed)
Spoke with pt and advised Dr Anne Fu will order labs, if needed, in the am when he is seen.  Pt states understanding.

## 2022-11-08 NOTE — Telephone Encounter (Signed)
Pt is scheduled for office visit in the morning at 8am but is requesting to have labs done as well at his visit. He'd like a callback from the nurse regarding this. Please advise.

## 2022-11-09 ENCOUNTER — Encounter: Payer: Self-pay | Admitting: Cardiology

## 2022-11-09 ENCOUNTER — Ambulatory Visit: Payer: Medicare Other | Attending: Cardiology | Admitting: Cardiology

## 2022-11-09 VITALS — BP 130/60 | HR 50 | Ht 71.0 in | Wt 189.2 lb

## 2022-11-09 DIAGNOSIS — I2510 Atherosclerotic heart disease of native coronary artery without angina pectoris: Secondary | ICD-10-CM

## 2022-11-09 DIAGNOSIS — I259 Chronic ischemic heart disease, unspecified: Secondary | ICD-10-CM | POA: Diagnosis not present

## 2022-11-09 DIAGNOSIS — E785 Hyperlipidemia, unspecified: Secondary | ICD-10-CM | POA: Diagnosis not present

## 2022-11-09 DIAGNOSIS — C61 Malignant neoplasm of prostate: Secondary | ICD-10-CM

## 2022-11-09 DIAGNOSIS — Z951 Presence of aortocoronary bypass graft: Secondary | ICD-10-CM

## 2022-11-09 LAB — LIPID PANEL
Chol/HDL Ratio: 3.3 {ratio} (ref 0.0–5.0)
Cholesterol, Total: 132 mg/dL (ref 100–199)
HDL: 40 mg/dL (ref >39)
LDL Chol Calc (NIH): 60 mg/dL (ref 0–99)
Triglycerides: 190 mg/dL — ABNORMAL HIGH (ref 0–149)
VLDL Cholesterol Cal: 32 mg/dL (ref 5–40)

## 2022-11-09 LAB — COMPREHENSIVE METABOLIC PANEL
ALT: 20 [IU]/L (ref 0–44)
AST: 21 [IU]/L (ref 0–40)
Albumin: 4.5 g/dL (ref 3.8–4.8)
Alkaline Phosphatase: 62 [IU]/L (ref 44–121)
BUN/Creatinine Ratio: 19 (ref 10–24)
BUN: 16 mg/dL (ref 8–27)
Bilirubin Total: 0.4 mg/dL (ref 0.0–1.2)
CO2: 26 mmol/L (ref 20–29)
Calcium: 9.6 mg/dL (ref 8.6–10.2)
Chloride: 104 mmol/L (ref 96–106)
Creatinine, Ser: 0.84 mg/dL (ref 0.76–1.27)
Globulin, Total: 2.2 g/dL (ref 1.5–4.5)
Glucose: 105 mg/dL — ABNORMAL HIGH (ref 70–99)
Potassium: 4.4 mmol/L (ref 3.5–5.2)
Sodium: 140 mmol/L (ref 134–144)
Total Protein: 6.7 g/dL (ref 6.0–8.5)
eGFR: 89 mL/min/{1.73_m2} (ref >59)

## 2022-11-09 LAB — CBC
Hematocrit: 38.2 % (ref 37.5–51.0)
Hemoglobin: 12.4 g/dL — ABNORMAL LOW (ref 13.0–17.7)
MCH: 28.9 pg (ref 26.6–33.0)
MCHC: 32.5 g/dL (ref 31.5–35.7)
MCV: 89 fL (ref 79–97)
Platelets: 283 10*3/uL (ref 150–450)
RBC: 4.29 x10E6/uL (ref 4.14–5.80)
RDW: 12.8 % (ref 11.6–15.4)
WBC: 5.3 10*3/uL (ref 3.4–10.8)

## 2022-11-09 NOTE — Progress Notes (Signed)
Cardiology Office Note:  .   Date:  11/09/2022  ID:  Jeff Decker, DOB November 07, 1945, MRN [id number] PCP: [facility]  [facility] Providers Cardiologist:  Donato Schultz, MD     History of Present Illness: .   Jeff Decker is a 77 y.o. male Discussed the use of AI scribe software for clinical note transcription with the patient, who gave verbal consent to proceed.  History of Present Illness   The patient is a 77 year old individual with a history of coronary artery disease, ischemic cardiomyopathy with an ejection fraction of 45-50%, hyperlipidemia, and prostate cancer. He underwent a quadruple coronary artery bypass graft in 2019 and a left carpal tunnel release recently. He reports overall good health with no chest pain, syncope, or bleeding. He is on Repatha due to statin intolerance, with a prior LDL of 66. He also has mild carotid stenosis and is on aspirin 81 mg and metoprolol tartrate 12.5 mg twice a day, with excellent blood pressure control. His ischemic cardiomyopathy has shown improvement. He is receiving injections every four months for prostate cancer.  The patient reports no pain and maintains an active lifestyle, working on a farm. He has not needed any nitroglycerin recently. He occasionally experiences constipation, which he attributes to his medication or age. He is considering a magnesium supplement to help with this issue. He has not eaten since the previous night in anticipation of blood work.       Social History - Works on a farm - Walking, rabbit hunting, fixing fence    Studies Reviewed: Marland Kitchen   EKG Interpretation Date/Time:  Friday November 09 2022 08:01:34 EDT Ventricular Rate:  50 PR Interval:  216 QRS Duration:  94 QT Interval:  478 QTC Calculation: 435 R Axis:   73  Text Interpretation: Sinus bradycardia with 1st degree A-V block Anteroseptal infarct (cited on or before 20-Dec-2017) When compared with ECG  of 20-Dec-2017 08:10, PR interval has increased Questionable change in initial forces of Anterior leads Nonspecific T wave abnormality no longer evident in Inferior leads Nonspecific T wave abnormality, improved in Lateral leads Confirmed by Donato Schultz ([id number]) on 11/09/2022 8:19:16 AM    Results LABS LDL: 66 prior LDL: 71 prior  DIAGNOSTIC EKG: No significant changes from previous Echocardiogram: Normal left ventricular ejection fraction, low normal  Risk Assessment/Calculations:            Physical Exam:   VS:  BP 130/60   Pulse (!) 50   Ht 5\' 11"  (1.803 m)   Wt 189 lb 3.2 oz (85.8 kg)   SpO2 94%   BMI 26.39 kg/m    Wt Readings from Last 3 Encounters:  11/09/22 189 lb 3.2 oz (85.8 kg)  10/10/22 189 lb 12.8 oz (86.1 kg)  06/06/22 183 lb 14.4 oz (83.4 kg)    GEN: Well nourished, well developed in no acute distress NECK: No JVD; No carotid bruits CARDIAC: RRR, no murmurs, no rubs, no gallops RESPIRATORY:  Clear to auscultation without rales, wheezing or rhonchi  ABDOMEN: Soft, non-tender, non-distended EXTREMITIES:  No edema; No deformity   ASSESSMENT AND PLAN: .    Assessment and Plan    Coronary Artery Disease (CAD) Stable post CABG in 2019. No chest pain or syncope. EKG unchanged. EF improved to low normal. -Continue current regimen of Aspirin 81mg  daily, Metoprolol Tartrate 12.5mg  BID, and Repatha.  Hyperlipidemia LDL controlled at 71 on Repatha. -Continue Repatha. -Order lipid panel today to monitor status.  Prostate Cancer Undergoing injections every four months. -Continue current treatment plan.  Mild Carotid Stenosis On Aspirin 81mg  and Metoprolol Tartrate 12.5mg  BID. -Continue current regimen. -Plan to periodically monitor carotid stenosis, not annually.  Constipation Possible medication side effect. -Consider increasing dietary fiber and fruit intake. -Consider adding a magnesium supplement.  General Health Maintenance -Order CBC and CMP  today. -Plan for one year follow-up.               Signed, Donato Schultz, MD

## 2022-11-09 NOTE — Patient Instructions (Signed)
Medication Instructions:  Your physician recommends that you continue on your current medications as directed. Please refer to the Current Medication list given to you today.  *If you need a refill on your cardiac medications before your next appointment, please call your pharmacy*   Lab Work: CBC, CMP, FLP  If you have labs (blood work) drawn today and your tests are completely normal, you will receive your results only by: MyChart Message (if you have MyChart) OR A paper copy in the mail If you have any lab test that is abnormal or we need to change your treatment, we will call you to review the results.   Testing/Procedures: NONE   Follow-Up: At [facility], you and your health needs are our priority.  As part of our continuing mission to provide you with exceptional heart care, we have created designated Provider Care Teams.  These Care Teams include your primary Cardiologist (physician) and Advanced Practice Providers (APPs -  Physician Assistants and Nurse Practitioners) who all work together to provide you with the care you need, when you need it.   Your next appointment:   1 year(s)  Provider:   Donato Schultz, MD

## 2023-02-06 NOTE — Progress Notes (Unsigned)
 [facility] OFFICE PROGRESS NOTE  [facility] [address] 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 04/30/2016: Underwent prostate biopsy that showed Gleason score 4+5 = 9 in one core and multiple cores of Gleason 8.  05/23/2016: Bone scan: metastatic disease to the bone including right pelvis, L1 and ribs.  04/2016: CT A/P: showed pelvic adenopathy 04/2016: Received first injection of Firmagon 06/13/2016-8/28/20218: Received 6 cycles of Taxotere  chemotherapy 75 mg/m2 01/15/2018-09/18/2018: Received Lupron  injections q 4 months   CURRENT THERAPY: 01/20/2019-present: Started Eligard  injections q 4 months  INTERVAL HISTORY: Jeff Decker 78 y.o. male returns to the clinic today for a follow-up visit.  The patient was last seen by Dr. Federico on 10/10/2022.  He is currently undergoing treatment with Eligard  injections every 4 months.  The patient is overall in good physical health.  He mentions today that he sometimes has emotional ups and downs but he has contemplated stopping his treatment due to side effects of night sweats causing insomnia and joint pain.  Because his PSA has been 0.2, he is wondering what this means for his prostate cancer and statistics of if and when disease progression may occur.   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  He denies any unusual bone pain.  Denies any fever, chills, or night sweats.  He did lose weight since being seen but he states he made some dietary modifications such as not eating as much at nighttime.  He does have some constipation which he attributes to his Eligard .  He denies any nausea, vomiting, diarrhea, or constipation.  Denies any dizziness, shortness of breath,  changes in urination, cough, peripheral edema, or neuropathy.  He is here today for evaluation and repeat blood work before undergoing his next Eligard  injection.  MEDICAL HISTORY: Past Medical History:  Diagnosis Date   Allergy    Arthritis    Cancer (HCC) 03/2016   prostate and bone , chemo completed   Coronary artery disease    GERD (gastroesophageal reflux disease)    Hyperlipidemia    Myocardial infarction (HCC) 1989    ALLERGIES:  is allergic to statins.  MEDICATIONS:  Current Outpatient Medications  Medication Sig Dispense Refill   acetaminophen  (TYLENOL ) 325 MG tablet Take 2 tablets (650 mg total) by mouth every 6 (six) hours as needed for mild pain.     aspirin  EC 81 MG tablet Take 81 mg by mouth daily.     Calcium  Carb-Cholecalciferol 600-800 MG-UNIT TABS Take 1 tablet by mouth daily.      Evolocumab  (REPATHA  SURECLICK) 140 MG/ML SOAJ INJECT 1 PEN INTO THE SKIN EVERY 14 (FOURTEEN) DAYS. 6 mL 3   fexofenadine (ALLEGRA) 180 MG tablet Take 180 mg by mouth daily.     glucosamine-chondroitin 500-400 MG tablet Take 1 tablet by mouth daily.      metoprolol  tartrate (LOPRESSOR ) 25 MG tablet TAKE 1/2 TABLET TWICE A DAY BY MOUTH 90 tablet 3   Multiple Vitamin (MULTI-VITAMINS) TABS Take by mouth.     nitroGLYCERIN  (NITROSTAT ) 0.4 MG SL tablet PLACE 1 TABLET UNDER TONGUE EVERY 5 MINUTES AS NEEDED FOR CHEST PAIN. 75 tablet 2   omeprazole  (PRILOSEC) 20 MG capsule Take 20 mg by mouth daily before breakfast.  1   No current facility-administered medications for this visit.   Facility-Administered Medications Ordered in Other Visits  Medication Dose Route Frequency Provider Last Rate Last Admin   Leuprolide  Acetate (4 Month) (ELIGARD ) injection 30 mg  30 mg Subcutaneous Once Thayil, Irene T, PA-C        SURGICAL HISTORY:  Past Surgical History:  Procedure Laterality Date   COLONOSCOPY  2006   CORONARY ANGIOPLASTY WITH STENT PLACEMENT     CORONARY ARTERY BYPASS GRAFT N/A 10/03/2017    Procedure: CORONARY ARTERY BYPASS GRAFTING (CABG) x 4, USING LEFT INTERNAL MAMMARY ARTERY AND RIGHT GREATER SAPHENOUS VEIN HARVESTED ENDOSCOPICALLY. LIMA TO LAD, SVG TO DIAG, SVG TO OM, SVG TO PDA.;  Surgeon: Lucas Dorise POUR, MD;  Location: [facility] OR;  Service: Open Heart Surgery;  Laterality: N/A;   HEMORRHOID SURGERY     LEFT HEART CATH AND CORONARY ANGIOGRAPHY N/A 10/01/2017   Procedure: LEFT HEART CATH AND CORONARY ANGIOGRAPHY;  Surgeon: Jordan, Peter M, MD;  Location: [facility] INVASIVE CV LAB;  Service: Cardiovascular;  Laterality: N/A;   ORIF SHOULDER FRACTURE     78 years old   PROSTATE BIOPSY  03/2016   TEE WITHOUT CARDIOVERSION N/A 10/03/2017   Procedure: TRANSESOPHAGEAL ECHOCARDIOGRAM (TEE);  Surgeon: Lucas Dorise POUR, MD;  Location: [facility] OR;  Service: Open Heart Surgery;  Laterality: N/A;   TONSILLECTOMY AND ADENOIDECTOMY     78 years old    REVIEW OF SYSTEMS:   Review of Systems  Constitutional: Positive for night sweats. Positive for weight loss due to dietary changes. Negative for chills, fatigue, and fever.   HENT: Negative for mouth sores, nosebleeds, sore throat and trouble swallowing.   Eyes: Negative for eye problems and icterus.  Respiratory: Negative for cough, hemoptysis, shortness of breath and wheezing.   Cardiovascular: Negative for chest pain and leg swelling.  Gastrointestinal: Negative for abdominal pain, constipation, diarrhea, nausea and vomiting.  Genitourinary: Negative for bladder incontinence, difficulty urinating, dysuria, frequency and hematuria.   Musculoskeletal: Negative for back pain, gait problem, neck pain and neck stiffness.  Skin: Negative for itching and rash.  Neurological: Negative for dizziness, extremity weakness, gait problem, headaches, light-headedness and seizures.  Hematological: Negative for adenopathy. Does not bruise/bleed easily.  Psychiatric/Behavioral: Positive for sleep disturbances secondary to night sweats. Negative for confusion, and  depression. . The patient is not nervous/anxious.     PHYSICAL EXAMINATION:  Blood pressure 133/71, pulse (!) 52, temperature (!) 97.2 F (36.2 C), temperature source Temporal, resp. rate 16, weight 180 lb 4.8 oz (81.8 kg), SpO2 97%.  ECOG PERFORMANCE STATUS: 0-1  Physical Exam  Constitutional: Oriented to person, place, and time and well-developed, well-nourished, and in no distress.  HENT:  Head: Normocephalic and atraumatic.  Mouth/Throat: Oropharynx is clear and moist. No oropharyngeal exudate.  Eyes: Conjunctivae are normal. Right eye exhibits no discharge. Left eye exhibits no discharge. No scleral icterus.  Neck: Normal range of motion. Neck supple.  Cardiovascular: Bradycardic, regular rhythm, normal heart sounds and intact distal pulses.   Pulmonary/Chest: Effort normal and breath sounds normal. No respiratory distress. No wheezes. No rales.  Abdominal: Soft. Bowel sounds are normal. Exhibits no distension and no mass. There is no tenderness.  Musculoskeletal: Normal range of motion. Exhibits no edema.  Lymphadenopathy:    No cervical adenopathy.  Neurological: Alert and oriented to person, place, and time. Exhibits normal muscle tone. Gait normal. Coordination normal.  Skin: Skin is warm and dry. No rash noted. Not diaphoretic. No erythema. No  pallor.  Psychiatric: Mood, memory and judgment normal.  Vitals reviewed.  LABORATORY DATA: Lab Results  Component Value Date   WBC 5.4 02/08/2023   HGB 12.7 (L) 02/08/2023   HCT 37.7 (L) 02/08/2023   MCV 84.5 02/08/2023   PLT 259 02/08/2023      Chemistry      Component Value Date/Time   NA 140 02/08/2023 0755   NA 140 11/09/2022 0836   NA 139 11/20/2016 0800   K 4.0 02/08/2023 0755   K 4.2 11/20/2016 0800   CL 105 02/08/2023 0755   CO2 29 02/08/2023 0755   CO2 24 11/20/2016 0800   BUN 15 02/08/2023 0755   BUN 16 11/09/2022 0836   BUN 16.7 11/20/2016 0800   CREATININE 0.84 02/08/2023 0755   CREATININE 0.8  11/20/2016 0800      Component Value Date/Time   CALCIUM  9.3 02/08/2023 0755   CALCIUM  9.1 11/20/2016 0800   ALKPHOS 56 02/08/2023 0755   ALKPHOS 58 11/20/2016 0800   AST 16 02/08/2023 0755   AST 18 11/20/2016 0800   ALT 16 02/08/2023 0755   ALT 16 11/20/2016 0800   BILITOT 0.6 02/08/2023 0755   BILITOT 0.40 11/20/2016 0800       RADIOGRAPHIC STUDIES:  No results found.   ASSESSMENT/PLAN:  Jeff Decker is a 78 y.o. male who presents to the clinic for follow up for advanced prostate cancer.    # Castration-sensitive advanced prostate cancer involving the pelvic lymph nodes and bones: --Received first firmagon injection in 04/2016 --Received 6 cycles of Taxotere  chemotherapy 75 mg/m2 from 06/13/2016-8/28/20218 --Received Lupron  injections q 4 months from 01/15/2018-09/18/2018 --Started Eligard  injections q 4 months on 01/19/2021 --The patient is contemplating stopping treatment. He was also seen with Dr. Federico to have a goals of care discussion and risks and benefits of continuing treatment vs stopping. Dr. Federico estimated with treatment that he could live several years since he has only been on one line of therapy and has several options should he have progression in the future. Without pursuing treatment, Dr. Federico would estimate survival ~2 years or so.  --The patient opted to continue on eligard  as scheduled today.  --Per patient request, I gave him a copy of his pathology/Gleason score.  PLAN: --Labs from today show white blood cell 5.4, hemoglobin 12.7, MCV 84.5, and platelets of 259 --Gave patient copy of pathology report for his records.  -- last PSA at 0.3 on 10/10/22.  --Proceed with Eligard  injection today --RTC in 4 months with labs, follow up with repeat Eligard  injection        Orders Placed This Encounter  Procedures   CBC with Differential (Cancer Center Only)    Standing Status:   Future    Expected Date:   06/06/2023    Expiration Date:    02/08/2024   CMP (Cancer Center only)    Standing Status:   Future    Expected Date:   06/07/2023    Expiration Date:   02/08/2024   Prostate-Specific AG, Serum    Standing Status:   Future    Expected Date:   02/08/2023    Expiration Date:   02/08/2024    Ahleah Simko L Takuya Lariccia, PA-C 02/08/23

## 2023-02-07 ENCOUNTER — Other Ambulatory Visit: Payer: Self-pay | Admitting: Medical Oncology

## 2023-02-07 ENCOUNTER — Other Ambulatory Visit: Payer: Self-pay | Admitting: Physician Assistant

## 2023-02-07 DIAGNOSIS — C61 Malignant neoplasm of prostate: Secondary | ICD-10-CM

## 2023-02-08 ENCOUNTER — Inpatient Hospital Stay: Payer: Medicare Other | Attending: Internal Medicine

## 2023-02-08 ENCOUNTER — Ambulatory Visit: Payer: Medicare Other

## 2023-02-08 ENCOUNTER — Ambulatory Visit: Payer: Medicare Other | Admitting: Hematology and Oncology

## 2023-02-08 ENCOUNTER — Inpatient Hospital Stay: Payer: Medicare Other

## 2023-02-08 ENCOUNTER — Other Ambulatory Visit: Payer: Medicare Other

## 2023-02-08 ENCOUNTER — Inpatient Hospital Stay: Payer: Medicare Other | Admitting: Physician Assistant

## 2023-02-08 VITALS — BP 133/71 | HR 52 | Temp 97.2°F | Resp 16 | Wt 180.3 lb

## 2023-02-08 DIAGNOSIS — C61 Malignant neoplasm of prostate: Secondary | ICD-10-CM | POA: Diagnosis not present

## 2023-02-08 DIAGNOSIS — Z5111 Encounter for antineoplastic chemotherapy: Secondary | ICD-10-CM | POA: Diagnosis present

## 2023-02-08 DIAGNOSIS — C7951 Secondary malignant neoplasm of bone: Secondary | ICD-10-CM | POA: Insufficient documentation

## 2023-02-08 DIAGNOSIS — C775 Secondary and unspecified malignant neoplasm of intrapelvic lymph nodes: Secondary | ICD-10-CM | POA: Diagnosis not present

## 2023-02-08 LAB — CMP (CANCER CENTER ONLY)
ALT: 16 U/L (ref 0–44)
AST: 16 U/L (ref 15–41)
Albumin: 4.4 g/dL (ref 3.5–5.0)
Alkaline Phosphatase: 56 U/L (ref 38–126)
Anion gap: 6 (ref 5–15)
BUN: 15 mg/dL (ref 8–23)
CO2: 29 mmol/L (ref 22–32)
Calcium: 9.3 mg/dL (ref 8.9–10.3)
Chloride: 105 mmol/L (ref 98–111)
Creatinine: 0.84 mg/dL (ref 0.61–1.24)
GFR, Estimated: >60 mL/min (ref >60)
Glucose, Bld: 109 mg/dL — ABNORMAL HIGH (ref 70–99)
Potassium: 4 mmol/L (ref 3.5–5.1)
Sodium: 140 mmol/L (ref 135–145)
Total Bilirubin: 0.6 mg/dL (ref 0.0–1.2)
Total Protein: 7 g/dL (ref 6.5–8.1)

## 2023-02-08 LAB — CBC WITH DIFFERENTIAL (CANCER CENTER ONLY)
Abs Immature Granulocytes: 0.01 10*3/uL (ref 0.00–0.07)
Basophils Absolute: 0 10*3/uL (ref 0.0–0.1)
Basophils Relative: 1 %
Eosinophils Absolute: 0.1 10*3/uL (ref 0.0–0.5)
Eosinophils Relative: 2 %
HCT: 37.7 % — ABNORMAL LOW (ref 39.0–52.0)
Hemoglobin: 12.7 g/dL — ABNORMAL LOW (ref 13.0–17.0)
Immature Granulocytes: 0 %
Lymphocytes Relative: 33 %
Lymphs Abs: 1.8 10*3/uL (ref 0.7–4.0)
MCH: 28.5 pg (ref 26.0–34.0)
MCHC: 33.7 g/dL (ref 30.0–36.0)
MCV: 84.5 fL (ref 80.0–100.0)
Monocytes Absolute: 0.6 10*3/uL (ref 0.1–1.0)
Monocytes Relative: 10 %
Neutro Abs: 2.9 10*3/uL (ref 1.7–7.7)
Neutrophils Relative %: 54 %
Platelet Count: 259 10*3/uL (ref 150–400)
RBC: 4.46 MIL/uL (ref 4.22–5.81)
RDW: 12.2 % (ref 11.5–15.5)
WBC Count: 5.4 10*3/uL (ref 4.0–10.5)
nRBC: 0 % (ref 0.0–0.2)

## 2023-02-08 MED ORDER — LEUPROLIDE ACETATE (4 MONTH) 30 MG ~~LOC~~ KIT
30.0000 mg | PACK | Freq: Once | SUBCUTANEOUS | Status: AC
  Administered 2023-02-08: 30 mg via SUBCUTANEOUS
  Filled 2023-02-08: qty 30

## 2023-02-09 LAB — PROSTATE-SPECIFIC AG, SERUM (LABCORP): Prostate Specific Ag, Serum: 0.4 ng/mL (ref 0.0–4.0)

## 2023-04-25 ENCOUNTER — Other Ambulatory Visit: Payer: Self-pay | Admitting: *Deleted

## 2023-04-25 ENCOUNTER — Telehealth: Payer: Self-pay | Admitting: *Deleted

## 2023-04-25 DIAGNOSIS — C61 Malignant neoplasm of prostate: Secondary | ICD-10-CM

## 2023-04-25 NOTE — Telephone Encounter (Signed)
 Received call from pt. He states he has been having lower abd pain/flank pain for about 1 week. He thought maybe he was constipated and took a laxative with good results but no discernable change in his pain. Denies fever, chills, n/v/d. No difficulty passing his urine. He does not see the urologist as he sees Dr. Leonides Schanz for his prostate Ca. Advised to come in tomorrow for labs and u/a. He is agreeable to this. Scheduling message sent. Lab orders placed per Dr. Leonides Schanz orders.

## 2023-04-26 ENCOUNTER — Inpatient Hospital Stay: Attending: Internal Medicine

## 2023-04-26 DIAGNOSIS — C775 Secondary and unspecified malignant neoplasm of intrapelvic lymph nodes: Secondary | ICD-10-CM | POA: Insufficient documentation

## 2023-04-26 DIAGNOSIS — R1030 Lower abdominal pain, unspecified: Secondary | ICD-10-CM | POA: Insufficient documentation

## 2023-04-26 DIAGNOSIS — C7951 Secondary malignant neoplasm of bone: Secondary | ICD-10-CM | POA: Insufficient documentation

## 2023-04-26 DIAGNOSIS — C61 Malignant neoplasm of prostate: Secondary | ICD-10-CM | POA: Insufficient documentation

## 2023-04-26 LAB — CMP (CANCER CENTER ONLY)
ALT: 16 U/L (ref 0–44)
AST: 18 U/L (ref 15–41)
Albumin: 4.8 g/dL (ref 3.5–5.0)
Alkaline Phosphatase: 52 U/L (ref 38–126)
Anion gap: 5 (ref 5–15)
BUN: 13 mg/dL (ref 8–23)
CO2: 31 mmol/L (ref 22–32)
Calcium: 9.7 mg/dL (ref 8.9–10.3)
Chloride: 104 mmol/L (ref 98–111)
Creatinine: 0.74 mg/dL (ref 0.61–1.24)
GFR, Estimated: >60 mL/min (ref >60)
Glucose, Bld: 113 mg/dL — ABNORMAL HIGH (ref 70–99)
Potassium: 4.2 mmol/L (ref 3.5–5.1)
Sodium: 140 mmol/L (ref 135–145)
Total Bilirubin: 0.7 mg/dL (ref 0.0–1.2)
Total Protein: 7.7 g/dL (ref 6.5–8.1)

## 2023-04-26 LAB — URINALYSIS, COMPLETE (UACMP) WITH MICROSCOPIC
Bacteria, UA: NONE SEEN
Bilirubin Urine: NEGATIVE
Glucose, UA: NEGATIVE mg/dL
Hgb urine dipstick: NEGATIVE
Ketones, ur: NEGATIVE mg/dL
Leukocytes,Ua: NEGATIVE
Nitrite: NEGATIVE
Protein, ur: NEGATIVE mg/dL
Specific Gravity, Urine: 1.004 — ABNORMAL LOW (ref 1.005–1.030)
pH: 7 (ref 5.0–8.0)

## 2023-04-26 LAB — CBC WITH DIFFERENTIAL (CANCER CENTER ONLY)
Abs Immature Granulocytes: 0.01 10*3/uL (ref 0.00–0.07)
Basophils Absolute: 0 10*3/uL (ref 0.0–0.1)
Basophils Relative: 1 %
Eosinophils Absolute: 0.1 10*3/uL (ref 0.0–0.5)
Eosinophils Relative: 1 %
HCT: 40.3 % (ref 39.0–52.0)
Hemoglobin: 13.2 g/dL (ref 13.0–17.0)
Immature Granulocytes: 0 %
Lymphocytes Relative: 31 %
Lymphs Abs: 1.6 10*3/uL (ref 0.7–4.0)
MCH: 29 pg (ref 26.0–34.0)
MCHC: 32.8 g/dL (ref 30.0–36.0)
MCV: 88.6 fL (ref 80.0–100.0)
Monocytes Absolute: 0.5 10*3/uL (ref 0.1–1.0)
Monocytes Relative: 11 %
Neutro Abs: 2.8 10*3/uL (ref 1.7–7.7)
Neutrophils Relative %: 56 %
Platelet Count: 251 10*3/uL (ref 150–400)
RBC: 4.55 MIL/uL (ref 4.22–5.81)
RDW: 12.6 % (ref 11.5–15.5)
WBC Count: 4.9 10*3/uL (ref 4.0–10.5)
nRBC: 0 % (ref 0.0–0.2)

## 2023-04-27 LAB — PROSTATE-SPECIFIC AG, SERUM (LABCORP): Prostate Specific Ag, Serum: 0.5 ng/mL (ref 0.0–4.0)

## 2023-04-27 LAB — URINE CULTURE: Culture: NO GROWTH

## 2023-06-10 ENCOUNTER — Other Ambulatory Visit: Payer: Self-pay | Admitting: Physician Assistant

## 2023-06-10 DIAGNOSIS — C61 Malignant neoplasm of prostate: Secondary | ICD-10-CM

## 2023-06-11 ENCOUNTER — Ambulatory Visit: Payer: Medicare Other

## 2023-06-11 ENCOUNTER — Other Ambulatory Visit: Payer: Medicare Other

## 2023-06-11 ENCOUNTER — Ambulatory Visit: Payer: Medicare Other | Admitting: Hematology and Oncology

## 2023-06-11 ENCOUNTER — Inpatient Hospital Stay (HOSPITAL_BASED_OUTPATIENT_CLINIC_OR_DEPARTMENT_OTHER): Admitting: Physician Assistant

## 2023-06-11 ENCOUNTER — Inpatient Hospital Stay: Attending: Internal Medicine

## 2023-06-11 ENCOUNTER — Inpatient Hospital Stay

## 2023-06-11 VITALS — BP 139/69 | HR 47 | Temp 97.3°F | Resp 16 | Wt 170.7 lb

## 2023-06-11 VITALS — BP 144/63 | HR 46 | Temp 98.0°F | Resp 16

## 2023-06-11 DIAGNOSIS — Z5111 Encounter for antineoplastic chemotherapy: Secondary | ICD-10-CM | POA: Diagnosis present

## 2023-06-11 DIAGNOSIS — C770 Secondary and unspecified malignant neoplasm of lymph nodes of head, face and neck: Secondary | ICD-10-CM | POA: Insufficient documentation

## 2023-06-11 DIAGNOSIS — C7951 Secondary malignant neoplasm of bone: Secondary | ICD-10-CM | POA: Diagnosis present

## 2023-06-11 DIAGNOSIS — C61 Malignant neoplasm of prostate: Secondary | ICD-10-CM

## 2023-06-11 LAB — CMP (CANCER CENTER ONLY)
ALT: 20 U/L (ref 0–44)
AST: 22 U/L (ref 15–41)
Albumin: 4.5 g/dL (ref 3.5–5.0)
Alkaline Phosphatase: 53 U/L (ref 38–126)
Anion gap: 5 (ref 5–15)
BUN: 14 mg/dL (ref 8–23)
CO2: 30 mmol/L (ref 22–32)
Calcium: 9.2 mg/dL (ref 8.9–10.3)
Chloride: 105 mmol/L (ref 98–111)
Creatinine: 0.77 mg/dL (ref 0.61–1.24)
GFR, Estimated: >60 mL/min (ref >60)
Glucose, Bld: 112 mg/dL — ABNORMAL HIGH (ref 70–99)
Potassium: 4.3 mmol/L (ref 3.5–5.1)
Sodium: 140 mmol/L (ref 135–145)
Total Bilirubin: 0.6 mg/dL (ref 0.0–1.2)
Total Protein: 7.1 g/dL (ref 6.5–8.1)

## 2023-06-11 LAB — CBC WITH DIFFERENTIAL (CANCER CENTER ONLY)
Abs Immature Granulocytes: 0.01 10*3/uL (ref 0.00–0.07)
Basophils Absolute: 0 10*3/uL (ref 0.0–0.1)
Basophils Relative: 1 %
Eosinophils Absolute: 0.1 10*3/uL (ref 0.0–0.5)
Eosinophils Relative: 2 %
HCT: 37.4 % — ABNORMAL LOW (ref 39.0–52.0)
Hemoglobin: 12.8 g/dL — ABNORMAL LOW (ref 13.0–17.0)
Immature Granulocytes: 0 %
Lymphocytes Relative: 32 %
Lymphs Abs: 1.6 10*3/uL (ref 0.7–4.0)
MCH: 29.2 pg (ref 26.0–34.0)
MCHC: 34.2 g/dL (ref 30.0–36.0)
MCV: 85.2 fL (ref 80.0–100.0)
Monocytes Absolute: 0.5 10*3/uL (ref 0.1–1.0)
Monocytes Relative: 10 %
Neutro Abs: 2.8 10*3/uL (ref 1.7–7.7)
Neutrophils Relative %: 55 %
Platelet Count: 260 10*3/uL (ref 150–400)
RBC: 4.39 MIL/uL (ref 4.22–5.81)
RDW: 12.3 % (ref 11.5–15.5)
WBC Count: 5.1 10*3/uL (ref 4.0–10.5)
nRBC: 0 % (ref 0.0–0.2)

## 2023-06-11 MED ORDER — LEUPROLIDE ACETATE (4 MONTH) 30 MG ~~LOC~~ KIT
30.0000 mg | PACK | Freq: Once | SUBCUTANEOUS | Status: AC
  Administered 2023-06-11: 30 mg via SUBCUTANEOUS
  Filled 2023-06-11: qty 30

## 2023-06-11 NOTE — Progress Notes (Signed)
 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 04/30/2016: Underwent prostate biopsy that showed Gleason score 4+5 = 9 in one core and multiple cores of Gleason 8.  05/23/2016: Bone scan: metastatic disease to the bone including right pelvis, L1 and ribs.  04/2016: CT A/P: showed pelvic adenopathy 04/2016: Received first injection of Firmagon 06/13/2016-8/28/20218: Received 6 cycles of Taxotere  chemotherapy 75 mg/m2 01/15/2018-09/18/2018: Received Lupron  injections q 4 months   CURRENT THERAPY: 01/20/2019-present: Started Eligard  injections q 4 months  INTERVAL HISTORY: Jeff Decker 78 y.o. male returns to the clinic today for a follow-up visit.  The patient was last seen by Cassie Heilingoetter PA-C on 02/08/2023.  In the interim, he denies any changes to his health.  Jeff Decker reports that he continues to stay active and complete all his daily activities on his own.  He has been trying to lose weight with dietary modifications.  He has successfully lost 10 pounds since January 2025.  He denies nausea, vomiting or bowel habit changes.  He denies easy bruising or signs of active bleeding.  He does struggle with staying asleep, sleeping a total of 3 to 4 hours at a time.  He denies any interference with his energy levels during the day due to his insomnia.  He denies any new bone or back pain.  He denies fevers, chills, night sweats, shortness of breath, chest pain or cough.  He has no other complaints.  Rest of the 10 point ROS as below.  MEDICAL HISTORY: Past Medical History:  Diagnosis Date   Allergy    Arthritis    Cancer (HCC) 03/2016   prostate and bone , chemo completed   Coronary artery disease    GERD  (gastroesophageal reflux disease)    Hyperlipidemia    Myocardial infarction (HCC) 1989    ALLERGIES:  is allergic to statins.  MEDICATIONS:  Current Outpatient Medications  Medication Sig Dispense Refill   acetaminophen  (TYLENOL ) 325 MG tablet Take 2 tablets (650 mg total) by mouth every 6 (six) hours as needed for mild pain.     aspirin  EC 81 MG tablet Take 81 mg by mouth daily.     Calcium  Carb-Cholecalciferol 600-800 MG-UNIT TABS Take 1 tablet by mouth daily.      Evolocumab  (REPATHA  SURECLICK) 140 MG/ML SOAJ INJECT 1 PEN INTO THE SKIN EVERY 14 (FOURTEEN) DAYS. 6 mL 3   fexofenadine (ALLEGRA) 180 MG tablet Take 180 mg by mouth daily.     glucosamine-chondroitin 500-400 MG tablet Take 1 tablet by mouth daily.      metoprolol  tartrate (LOPRESSOR ) 25 MG tablet TAKE 1/2 TABLET TWICE A DAY BY MOUTH 90 tablet 3   Multiple Vitamin (MULTI-VITAMINS) TABS Take by mouth.     nitroGLYCERIN  (NITROSTAT ) 0.4 MG SL tablet PLACE 1 TABLET UNDER TONGUE EVERY 5 MINUTES AS NEEDED FOR CHEST PAIN. 75 tablet 2   omeprazole  (PRILOSEC) 20 MG capsule Take 20 mg by mouth daily before breakfast.  1   No current facility-administered medications for this visit.    SURGICAL HISTORY:  Past Surgical History:  Procedure Laterality Date   COLONOSCOPY  2006   CORONARY ANGIOPLASTY WITH STENT PLACEMENT     CORONARY ARTERY BYPASS GRAFT N/A 10/03/2017   Procedure: CORONARY ARTERY BYPASS GRAFTING (CABG)  x 4, USING LEFT INTERNAL MAMMARY ARTERY AND RIGHT GREATER SAPHENOUS VEIN HARVESTED ENDOSCOPICALLY. LIMA TO LAD, SVG TO DIAG, SVG TO OM, SVG TO PDA.;  Surgeon: Bartley Lightning, MD;  Location: [facility] OR;  Service: Open Heart Surgery;  Laterality: N/A;   HEMORRHOID SURGERY     LEFT HEART CATH AND CORONARY ANGIOGRAPHY N/A 10/01/2017   Procedure: LEFT HEART CATH AND CORONARY ANGIOGRAPHY;  Surgeon: [place], Peter M, MD;  Location: [facility] INVASIVE CV LAB;  Service: Cardiovascular;  Laterality: N/A;   ORIF SHOULDER FRACTURE     78 years  old   PROSTATE BIOPSY  03/2016   TEE WITHOUT CARDIOVERSION N/A 10/03/2017   Procedure: TRANSESOPHAGEAL ECHOCARDIOGRAM (TEE);  Surgeon: Bartley Lightning, MD;  Location: [facility] OR;  Service: Open Heart Surgery;  Laterality: N/A;   TONSILLECTOMY AND ADENOIDECTOMY     78 years old    REVIEW OF SYSTEMS:   Review of Systems  Constitutional: Positive for night sweats. Positive for weight loss due to dietary changes. Negative for chills, fatigue, and fever.   HENT: Negative for mouth sores, nosebleeds, sore throat and trouble swallowing.   Eyes: Negative for eye problems and icterus.  Respiratory: Negative for cough, hemoptysis, shortness of breath and wheezing.   Cardiovascular: Negative for chest pain and leg swelling.  Gastrointestinal: Negative for abdominal pain, constipation, diarrhea, nausea and vomiting.  Genitourinary: Negative for bladder incontinence, difficulty urinating, dysuria, frequency and hematuria.   Musculoskeletal: Negative for back pain, gait problem, neck pain and neck stiffness.  Skin: Negative for itching and rash.  Neurological: Negative for dizziness, extremity weakness, gait problem, headaches, light-headedness and seizures.  Hematological: Negative for adenopathy. Does not bruise/bleed easily.  Psychiatric/Behavioral: Positive for sleep disturbances secondary to night sweats. Negative for confusion, and depression. . The patient is not nervous/anxious.     PHYSICAL EXAMINATION:  Blood pressure 139/69, pulse (!) 47, temperature (!) 97.3 F (36.3 C), temperature source Temporal, resp. rate 16, weight 170 lb 11.2 oz (77.4 kg), SpO2 100%.  ECOG PERFORMANCE STATUS: 0-1  Physical Exam  Constitutional: Oriented to person, place, and time and well-developed, well-nourished, and in no distress.  HENT:  Head: Normocephalic and atraumatic.  Mouth/Throat: Oropharynx is clear and moist. No oropharyngeal exudate.  Eyes: Conjunctivae are normal. Right eye exhibits no discharge. Left  eye exhibits no discharge. No scleral icterus.  Cardiovascular: Bradycardic, regular rhythm, normal heart sounds and intact distal pulses.   Pulmonary/Chest: Effort normal and breath sounds normal. No respiratory distress. No wheezes. No rales.  Musculoskeletal: Normal range of motion. Exhibits no edema.  Lymphadenopathy:    No cervical adenopathy.  Neurological: Alert and oriented to person, place, and time. Exhibits normal muscle tone. Gait normal. Coordination normal.  Skin: Skin is warm and dry. No rash noted. Not diaphoretic. No erythema. No pallor.  Psychiatric: Mood, memory and judgment normal.  Vitals reviewed.  LABORATORY DATA: Lab Results  Component Value Date   WBC 5.1 06/11/2023   HGB 12.8 (L) 06/11/2023   HCT 37.4 (L) 06/11/2023   MCV 85.2 06/11/2023   PLT 260 06/11/2023      Chemistry      Component Value Date/Time   NA 140 06/11/2023 0913   NA 140 11/09/2022 0836   NA 139 11/20/2016 0800   K 4.3 06/11/2023 0913   K 4.2 11/20/2016 0800   CL 105 06/11/2023 0913   CO2 30 06/11/2023 0913   CO2 24 11/20/2016 0800   BUN 14 06/11/2023 0913   BUN 16  11/09/2022 0836   BUN 16.7 11/20/2016 0800   CREATININE 0.77 06/11/2023 0913   CREATININE 0.8 11/20/2016 0800      Component Value Date/Time   CALCIUM  9.2 06/11/2023 0913   CALCIUM  9.1 11/20/2016 0800   ALKPHOS 53 06/11/2023 0913   ALKPHOS 58 11/20/2016 0800   AST 22 06/11/2023 0913   AST 18 11/20/2016 0800   ALT 20 06/11/2023 0913   ALT 16 11/20/2016 0800   BILITOT 0.6 06/11/2023 0913   BILITOT 0.40 11/20/2016 0800       RADIOGRAPHIC STUDIES:  No results found.   ASSESSMENT/PLAN:  Jeff Decker is a 78 y.o. male who presents to the clinic for follow up for advanced prostate cancer.    # Castration-sensitive advanced prostate cancer involving the pelvic lymph nodes and bones: --Received first firmagon injection in 04/2016 --Received 6 cycles of Taxotere  chemotherapy 75 mg/m2 from  06/13/2016-8/28/20218 --Received Lupron  injections q 4 months from 01/15/2018-09/18/2018 --Started Eligard  injections q 4 months on 01/19/2021 PLAN: --Labs from today show white blood cell 5.1, hemoglobin 12.8, MCV 85.2, and platelets of 260, creatinine and LFTs are normal.  -- last PSA at 0.4 on 02/08/2023. Repeating PSA level today --Proceed with Eligard  injection today --RTC in 4 months with labs, follow up with repeat Eligard  injection    Jeff Decker expressed understanding of the plan provided.   No orders of the defined types were placed in this encounter.   I have spent a total of 25 minutes minutes of face-to-face and non-face-to-face time, preparing to see the patient,  performing a medically appropriate examination, counseling and educating the patient, documenting clinical information in the electronic health record, independently interpreting results and communicating results to the patient, and care coordination.   Wyline Hearing PA-C Dept of Hematology and Oncology [facility] [place] Phone: [phone]

## 2023-06-12 ENCOUNTER — Telehealth: Payer: Self-pay | Admitting: Cardiology

## 2023-06-12 DIAGNOSIS — R079 Chest pain, unspecified: Secondary | ICD-10-CM

## 2023-06-12 LAB — PROSTATE-SPECIFIC AG, SERUM (LABCORP): Prostate Specific Ag, Serum: 0.5 ng/mL (ref 0.0–4.0)

## 2023-06-12 LAB — TESTOSTERONE: Testosterone: <3 ng/dL — ABNORMAL LOW (ref 264–916)

## 2023-06-12 NOTE — Telephone Encounter (Signed)
 Spoke with pt and he had stated that he had been working on his car and laying on his back yesterday and last night he had left CP that was similar to prior to his CABG. Pt stated he took 2 nitroglycerin  last night (pt took both at the same time, did not realize he had to wait in between taking the second dose) and his CP subsided and he went to bed. Pt denies any CP since and denies any other symptoms. Explained ED precautions with pt as well as proper use of nitroglycerin . Explained to pt that I will forward this information to Dr. Renna Cary and his nurse to review for recommendations. Pt verbalized understanding of plan.

## 2023-06-12 NOTE — Telephone Encounter (Signed)
   Pt c/o of Chest Pain: STAT if active CP, including tightness, pressure, jaw pain, radiating pain to shoulder/upper arm/back, CP unrelieved by Nitro. Symptoms reported of SOB, nausea, vomiting, sweating.  1. Are you having CP right now? No    2. Are you experiencing any other symptoms (ex. SOB, nausea, vomiting, sweating)? No   3. Is your CP continuous or coming and going? Coming and going   4. Have you taken Nitroglycerin ? Took 2 last night with some relief but gave him a headache   5. How long have you been experiencing CP? Since yesterday afternoon on/off.     6. If NO CP at time of call then end call with telling Pt to call back or call 911 if Chest pain returns prior to return call from triage team.

## 2023-06-14 NOTE — Telephone Encounter (Signed)
 Pt aware of orders to have myoview .  Order placed.  Reviewed instructions with him and he is aware to be contacted to be scheduled.

## 2023-06-14 NOTE — Addendum Note (Signed)
 Addended by: Thaddeus Filippo on: 06/14/2023 01:01 PM   Modules accepted: Orders

## 2023-06-14 NOTE — Telephone Encounter (Signed)
 Left message for pt to call back to discuss his s/s and Dr Renna Cary orders.

## 2023-06-18 ENCOUNTER — Other Ambulatory Visit: Payer: Self-pay | Admitting: Cardiology

## 2023-06-18 DIAGNOSIS — R072 Precordial pain: Secondary | ICD-10-CM

## 2023-06-19 ENCOUNTER — Other Ambulatory Visit: Payer: Self-pay | Admitting: Cardiology

## 2023-06-19 ENCOUNTER — Telehealth (HOSPITAL_COMMUNITY): Payer: Self-pay | Admitting: *Deleted

## 2023-06-19 DIAGNOSIS — R079 Chest pain, unspecified: Secondary | ICD-10-CM

## 2023-06-19 NOTE — Telephone Encounter (Signed)
 Patient given detailed instructions per Myocardial Perfusion Study Information Sheet for the test on 06/21/2023 at 7:45. Patient notified to arrive 15 minutes early and that it is imperative to arrive on time for appointment to keep from having the test rescheduled.  If you need to cancel or reschedule your appointment, please call the office within 24 hours of your appointment. . Patient verbalized understanding.Anne Barrios

## 2023-06-21 ENCOUNTER — Ambulatory Visit (HOSPITAL_COMMUNITY)
Admission: RE | Admit: 2023-06-21 | Discharge: 2023-06-21 | Disposition: A | Discharge status: Home / Self Care | Source: Ambulatory Visit | Attending: Cardiology | Admitting: Cardiology

## 2023-06-21 ENCOUNTER — Ambulatory Visit: Payer: Self-pay | Admitting: Cardiology

## 2023-06-21 DIAGNOSIS — R079 Chest pain, unspecified: Secondary | ICD-10-CM

## 2023-06-21 LAB — MYOCARDIAL PERFUSION IMAGING
Angina Index: 0
Duke Treadmill Score: 9
Estimated workload: 10.1
Exercise duration (min): 9 min
Exercise duration (sec): 0 s
LV dias vol: 74 mL (ref 62–150)
LV sys vol: 158 mL
MPHR: 142 {beats}/min
Nuc Stress EF: 53 %
Peak HR: 146 {beats}/min
Percent HR: 102 %
Rest HR: 58 {beats}/min
Rest Nuclear Isotope Dose: 10.6 mCi
SDS: 3
SRS: 20
SSS: 23
ST Depression (mm): 0 mm
Stress Nuclear Isotope Dose: 32.8 mCi
TID: 1.01

## 2023-06-21 MED ORDER — TECHNETIUM TC 99M TETROFOSMIN IV KIT
10.6000 | PACK | Freq: Once | INTRAVENOUS | Status: AC | PRN
  Administered 2023-06-21: 10.6 via INTRAVENOUS

## 2023-06-21 MED ORDER — TECHNETIUM TC 99M TETROFOSMIN IV KIT
32.8000 | PACK | Freq: Once | INTRAVENOUS | Status: AC | PRN
Start: 2023-06-21 — End: 2023-06-21
  Administered 2023-06-21: 32.8 via INTRAVENOUS

## 2023-10-15 ENCOUNTER — Inpatient Hospital Stay: Attending: Hematology and Oncology

## 2023-10-15 ENCOUNTER — Other Ambulatory Visit: Payer: Self-pay | Admitting: *Deleted

## 2023-10-15 ENCOUNTER — Inpatient Hospital Stay (HOSPITAL_BASED_OUTPATIENT_CLINIC_OR_DEPARTMENT_OTHER): Admitting: Hematology and Oncology

## 2023-10-15 ENCOUNTER — Other Ambulatory Visit: Payer: Self-pay | Admitting: Hematology and Oncology

## 2023-10-15 ENCOUNTER — Inpatient Hospital Stay

## 2023-10-15 VITALS — BP 132/62 | HR 45 | Temp 97.8°F | Resp 13 | Wt 171.0 lb

## 2023-10-15 DIAGNOSIS — C7951 Secondary malignant neoplasm of bone: Secondary | ICD-10-CM | POA: Diagnosis not present

## 2023-10-15 DIAGNOSIS — Z9221 Personal history of antineoplastic chemotherapy: Secondary | ICD-10-CM

## 2023-10-15 DIAGNOSIS — C61 Malignant neoplasm of prostate: Secondary | ICD-10-CM | POA: Diagnosis present

## 2023-10-15 DIAGNOSIS — Z79899 Other long term (current) drug therapy: Secondary | ICD-10-CM | POA: Insufficient documentation

## 2023-10-15 LAB — CMP (CANCER CENTER ONLY)
ALT: 15 U/L (ref 0–44)
AST: 19 U/L (ref 15–41)
Albumin: 4.4 g/dL (ref 3.5–5.0)
Alkaline Phosphatase: 50 U/L (ref 38–126)
Anion gap: 5 (ref 5–15)
BUN: 14 mg/dL (ref 8–23)
CO2: 30 mmol/L (ref 22–32)
Calcium: 9.2 mg/dL (ref 8.9–10.3)
Chloride: 106 mmol/L (ref 98–111)
Creatinine: 0.73 mg/dL (ref 0.61–1.24)
GFR, Estimated: >60 mL/min (ref >60)
Glucose, Bld: 106 mg/dL — ABNORMAL HIGH (ref 70–99)
Potassium: 4.3 mmol/L (ref 3.5–5.1)
Sodium: 141 mmol/L (ref 135–145)
Total Bilirubin: 0.5 mg/dL (ref 0.0–1.2)
Total Protein: 7 g/dL (ref 6.5–8.1)

## 2023-10-15 LAB — CBC WITH DIFFERENTIAL (CANCER CENTER ONLY)
Abs Immature Granulocytes: 0 K/uL (ref 0.00–0.07)
Basophils Absolute: 0 K/uL (ref 0.0–0.1)
Basophils Relative: 1 %
Eosinophils Absolute: 0.1 K/uL (ref 0.0–0.5)
Eosinophils Relative: 1 %
HCT: 36.7 % — ABNORMAL LOW (ref 39.0–52.0)
Hemoglobin: 12.4 g/dL — ABNORMAL LOW (ref 13.0–17.0)
Immature Granulocytes: 0 %
Lymphocytes Relative: 32 %
Lymphs Abs: 1.5 K/uL (ref 0.7–4.0)
MCH: 28.8 pg (ref 26.0–34.0)
MCHC: 33.8 g/dL (ref 30.0–36.0)
MCV: 85.3 fL (ref 80.0–100.0)
Monocytes Absolute: 0.6 K/uL (ref 0.1–1.0)
Monocytes Relative: 12 %
Neutro Abs: 2.5 K/uL (ref 1.7–7.7)
Neutrophils Relative %: 54 %
Platelet Count: 248 K/uL (ref 150–400)
RBC: 4.3 MIL/uL (ref 4.22–5.81)
RDW: 12.7 % (ref 11.5–15.5)
WBC Count: 4.7 K/uL (ref 4.0–10.5)
nRBC: 0 % (ref 0.0–0.2)

## 2023-10-15 MED ORDER — LEUPROLIDE ACETATE (4 MONTH) 30 MG ~~LOC~~ KIT
30.0000 mg | PACK | Freq: Once | SUBCUTANEOUS | Status: AC
  Administered 2023-10-15: 30 mg via SUBCUTANEOUS
  Filled 2023-10-15: qty 30

## 2023-10-15 NOTE — Progress Notes (Signed)
 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 04/30/2016: Underwent prostate biopsy that showed Gleason score 4+5 = 9 in one core and multiple cores of Gleason 8.  05/23/2016: Bone scan: metastatic disease to the bone including right pelvis, L1 and ribs.  04/2016: CT A/P: showed pelvic adenopathy 04/2016: Received first injection of Firmagon 06/13/2016-8/28/20218: Received 6 cycles of Taxotere  chemotherapy 75 mg/m2 01/15/2018-09/18/2018: Received Lupron  injections q 4 months   CURRENT THERAPY: 01/20/2019-present: Started Eligard  injections q 4 months  INTERVAL HISTORY: Jeff Decker 78 y.o. male returns to the clinic today for a follow-up visit.  The patient was last seen on 06/11/2023.  In the interim, he denies any changes to his health.  Jeff Decker reports he has been well overall in the room since our last visit 4 months ago.  He reports that he is able to do his day-to-day work without any difficulties or limitations.  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  He reports that he does have some occasional hot flashes though not daily.  He is not having any urinary issues or pain.  He denies any side effects as result of his Lupron  shots.  He reports his appetite is strong and his weight is stable.  He notes that he is intentionally losing weight and is down to 171 pounds, down from 180 pounds at the beginning of the year.  Reports he is doing it mostly through diet and exercise and getting rid of sugar and soda drinks.  He reports that he has not had any issues with lightheadedness, dizziness, or shortness of breath.  He denies any chest pain.  Full 10 point ROS is otherwise negative.  Overall he is willing and able to  continue on Lupron  shots at this time.  MEDICAL HISTORY: Past Medical History:  Diagnosis Date   Allergy    Arthritis    Cancer (HCC) 03/2016   prostate and bone , chemo completed   Coronary artery disease    GERD (gastroesophageal reflux disease)    Hyperlipidemia    Myocardial infarction (HCC) 1989    ALLERGIES:  is allergic to statins.  MEDICATIONS:  Current Outpatient Medications  Medication Sig Dispense Refill   acetaminophen  (TYLENOL ) 325 MG tablet Take 2 tablets (650 mg total) by mouth every 6 (six) hours as needed for mild pain.     aspirin  EC 81 MG tablet Take 81 mg by mouth daily.     Calcium  Carb-Cholecalciferol 600-800 MG-UNIT TABS Take 1 tablet by mouth daily.      Evolocumab  (REPATHA  SURECLICK) 140 MG/ML SOAJ INJECT 1 PEN INTO THE SKIN EVERY 14 (FOURTEEN) DAYS. 6 mL 3   fexofenadine (ALLEGRA) 180 MG tablet Take 180 mg by mouth daily.     glucosamine-chondroitin 500-400 MG tablet Take 1 tablet by mouth daily.      metoprolol  tartrate (LOPRESSOR ) 25 MG tablet TAKE 1/2 TABLET TWICE A DAY BY MOUTH 90 tablet 3   Multiple Vitamin (MULTI-VITAMINS) TABS Take by mouth.     nitroGLYCERIN  (NITROSTAT ) 0.4 MG SL tablet PLACE 1 TABLET UNDER TONGUE EVERY 5 MINUTES AS NEEDED FOR CHEST PAIN. 75 tablet 2   omeprazole  (PRILOSEC) 20 MG capsule Take 20 mg by mouth daily before breakfast.  1   No  current facility-administered medications for this visit.    SURGICAL HISTORY:  Past Surgical History:  Procedure Laterality Date   COLONOSCOPY  2006   CORONARY ANGIOPLASTY WITH STENT PLACEMENT     CORONARY ARTERY BYPASS GRAFT N/A 10/03/2017   Procedure: CORONARY ARTERY BYPASS GRAFTING (CABG) x 4, USING LEFT INTERNAL MAMMARY ARTERY AND RIGHT GREATER SAPHENOUS VEIN HARVESTED ENDOSCOPICALLY. LIMA TO LAD, SVG TO DIAG, SVG TO OM, SVG TO PDA.;  Surgeon: Lucas Dorise POUR, MD;  Location: [facility] OR;  Service: Open Heart Surgery;  Laterality: N/A;   HEMORRHOID SURGERY     LEFT HEART CATH AND CORONARY  ANGIOGRAPHY N/A 10/01/2017   Procedure: LEFT HEART CATH AND CORONARY ANGIOGRAPHY;  Surgeon: [place], Peter M, MD;  Location: [facility] INVASIVE CV LAB;  Service: Cardiovascular;  Laterality: N/A;   ORIF SHOULDER FRACTURE     78 years old   PROSTATE BIOPSY  03/2016   TEE WITHOUT CARDIOVERSION N/A 10/03/2017   Procedure: TRANSESOPHAGEAL ECHOCARDIOGRAM (TEE);  Surgeon: Lucas Dorise POUR, MD;  Location: [facility] OR;  Service: Open Heart Surgery;  Laterality: N/A;   TONSILLECTOMY AND ADENOIDECTOMY     78 years old    REVIEW OF SYSTEMS:   Review of Systems  Constitutional: Positive for night sweats. Positive for weight loss due to dietary changes. Negative for chills, fatigue, and fever.   HENT: Negative for mouth sores, nosebleeds, sore throat and trouble swallowing.   Eyes: Negative for eye problems and icterus.  Respiratory: Negative for cough, hemoptysis, shortness of breath and wheezing.   Cardiovascular: Negative for chest pain and leg swelling.  Gastrointestinal: Negative for abdominal pain, constipation, diarrhea, nausea and vomiting.  Genitourinary: Negative for bladder incontinence, difficulty urinating, dysuria, frequency and hematuria.   Musculoskeletal: Negative for back pain, gait problem, neck pain and neck stiffness.  Skin: Negative for itching and rash.  Neurological: Negative for dizziness, extremity weakness, gait problem, headaches, light-headedness and seizures.  Hematological: Negative for adenopathy. Does not bruise/bleed easily.  Psychiatric/Behavioral: Positive for sleep disturbances secondary to night sweats. Negative for confusion, and depression. . The patient is not nervous/anxious.     PHYSICAL EXAMINATION:  There were no vitals taken for this visit.  ECOG PERFORMANCE STATUS: 0-1  Physical Exam  Constitutional: Oriented to person, place, and time and well-developed, well-nourished, and in no distress.  HENT:  Head: Normocephalic and atraumatic.  Mouth/Throat: Oropharynx is  clear and moist. No oropharyngeal exudate.  Eyes: Conjunctivae are normal. Right eye exhibits no discharge. Left eye exhibits no discharge. No scleral icterus.  Cardiovascular: Bradycardic, regular rhythm, normal heart sounds and intact distal pulses.   Pulmonary/Chest: Effort normal and breath sounds normal. No respiratory distress. No wheezes. No rales.  Musculoskeletal: Normal range of motion. Exhibits no edema.  Lymphadenopathy:    No cervical adenopathy.  Neurological: Alert and oriented to person, place, and time. Exhibits normal muscle tone. Gait normal. Coordination normal.  Skin: Skin is warm and dry. No rash noted. Not diaphoretic. No erythema. No pallor.  Psychiatric: Mood, memory and judgment normal.  Vitals reviewed.  LABORATORY DATA: Lab Results  Component Value Date   WBC 5.1 06/11/2023   HGB 12.8 (L) 06/11/2023   HCT 37.4 (L) 06/11/2023   MCV 85.2 06/11/2023   PLT 260 06/11/2023      Chemistry      Component Value Date/Time   NA 140 06/11/2023 0913   NA 140 11/09/2022 0836   NA 139 11/20/2016 0800   K 4.3 06/11/2023 0913   K 4.2  11/20/2016 0800   CL 105 06/11/2023 0913   CO2 30 06/11/2023 0913   CO2 24 11/20/2016 0800   BUN 14 06/11/2023 0913   BUN 16 11/09/2022 0836   BUN 16.7 11/20/2016 0800   CREATININE 0.77 06/11/2023 0913   CREATININE 0.8 11/20/2016 0800      Component Value Date/Time   CALCIUM  9.2 06/11/2023 0913   CALCIUM  9.1 11/20/2016 0800   ALKPHOS 53 06/11/2023 0913   ALKPHOS 58 11/20/2016 0800   AST 22 06/11/2023 0913   AST 18 11/20/2016 0800   ALT 20 06/11/2023 0913   ALT 16 11/20/2016 0800   BILITOT 0.6 06/11/2023 0913   BILITOT 0.40 11/20/2016 0800       RADIOGRAPHIC STUDIES:  No results found.   ASSESSMENT/PLAN:  Jeff Decker is a 78 y.o. male who presents to the clinic for follow up for advanced prostate cancer.    # Castration-sensitive advanced prostate cancer involving the pelvic lymph nodes and  bones: --Received first firmagon injection in 04/2016 --Received 6 cycles of Taxotere  chemotherapy 75 mg/m2 from 06/13/2016-8/28/20218 --Received Lupron  injections q 4 months from 01/15/2018-09/18/2018 --Started Eligard  injections q 4 months on 01/19/2021 PLAN: --Labs from today show white blood cell 4.7, hemoglobin 12.4, MCV 85.3, platelets 248.. creatinine and LFTs are normal.  -- last PSA at 0.5 on 06/11/2023. Repeating PSA level today --Proceed with Eligard  injection today --RTC in 4 months with labs, follow up with repeat Eligard  injection    No orders of the defined types were placed in this encounter.   I have spent a total of 30 minutes minutes of face-to-face and non-face-to-face time, preparing to see the patient,  performing a medically appropriate examination, counseling and educating the patient, documenting clinical information in the electronic health record, independently interpreting results and communicating results to the patient, and care coordination.   Norleen IVAR Kidney, MD Department of Hematology/Oncology [facility] at [facility] Phone: [phone] Pager: [phone] Email: [email or web address]

## 2023-10-16 ENCOUNTER — Inpatient Hospital Stay

## 2023-10-16 DIAGNOSIS — C61 Malignant neoplasm of prostate: Secondary | ICD-10-CM | POA: Diagnosis not present

## 2023-10-16 LAB — TESTOSTERONE: Testosterone: <3 ng/dL — ABNORMAL LOW (ref 264–916)

## 2023-10-17 LAB — PROSTATE-SPECIFIC AG, SERUM (LABCORP): Prostate Specific Ag, Serum: 0.6 ng/mL (ref 0.0–4.0)

## 2023-10-19 ENCOUNTER — Telehealth: Payer: Self-pay | Admitting: Hematology and Oncology

## 2023-10-19 NOTE — Telephone Encounter (Signed)
 Called Jeff Decker to discuss the results of his most recent PSA.  PSA increased to 0.6.  This is up from 0.3 approximately year ago.  This has been a very slow rate of increase.  I discussed options with him including continued observation, repeat scan, and consideration of addition of Zytiga.  After discussing the risks and benefits the patient noted he would like to have his PSA checked again as scheduled in 4 months time.  This will be in January 2026.  In the event there is a continued rise in the PSA we could order imaging at that time with the consideration of additional therapy.  The patient voiced understanding and agreement with this plan.  Norleen IVAR Kidney, MD Department of Hematology/Oncology [facility] at [facility] Phone: [phone] Pager: [phone] Email: [email or web address]

## 2023-11-14 ENCOUNTER — Telehealth: Payer: Self-pay | Admitting: Cardiology

## 2023-11-14 MED ORDER — REPATHA SURECLICK 140 MG/ML ~~LOC~~ SOAJ: 140.0000 mg | SUBCUTANEOUS | 3 refills | Status: AC

## 2023-11-14 NOTE — Telephone Encounter (Signed)
*  STAT* If patient is at the pharmacy, call can be transferred to refill team.   1. Which medications need to be refilled? (please list name of each medication and dose if known)   Evolocumab  (REPATHA  SURECLICK) 140 MG/ML SOAJ    2. Which pharmacy/location (including street and city if local pharmacy) is medication to be sent to? [id number] - [place], [place] - [address]    3. Do they need a 30 day or 90 day supply?  90 day supply

## 2023-11-19 ENCOUNTER — Telehealth: Payer: Self-pay | Admitting: Cardiology

## 2023-11-19 NOTE — Telephone Encounter (Signed)
 Pt called in stating pharmacy told him he needed a prior authorization for   Evolocumab  (REPATHA  SURECLICK) 140 MG/ML SOAJ     please advise

## 2023-11-20 ENCOUNTER — Other Ambulatory Visit (HOSPITAL_COMMUNITY): Payer: Self-pay

## 2023-11-20 ENCOUNTER — Telehealth: Payer: Self-pay | Admitting: Pharmacy Technician

## 2023-11-20 ENCOUNTER — Encounter: Payer: Self-pay | Admitting: Hematology and Oncology

## 2023-11-20 NOTE — Telephone Encounter (Signed)
 Pharmacy Patient Advocate Encounter  Received notification from [facility] that Prior Authorization for repatha  has been APPROVED from 11/20/23 to 11/19/24. Ran test claim, Copay is $45.00- one month. This test claim was processed through [facility]- copay amounts may vary at other pharmacies due to pharmacy/plan contracts, or as the patient moves through the different stages of their insurance plan.   PA #/Case ID/Reference #: [id number]

## 2023-11-20 NOTE — Telephone Encounter (Signed)
 Pharmacy Patient Advocate Encounter   Received notification from Physician's Office that prior authorization for Repatha  is required/requested.   Insurance verification completed.   The patient is insured through [employer].   Per test claim: PA required; PA submitted to above mentioned insurance via Latent Key/confirmation #/EOC [id number] Status is pending

## 2024-02-03 DIAGNOSIS — C61 Malignant neoplasm of prostate: Secondary | ICD-10-CM

## 2024-02-03 NOTE — Progress Notes (Unsigned)
 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 04/30/2016: Underwent prostate biopsy that showed Gleason score 4+5 = 9 in one core and multiple cores of Gleason 8.  05/23/2016: Bone scan: metastatic disease to the bone including right pelvis, L1 and ribs.  04/2016: CT A/P: showed pelvic adenopathy 04/2016: Received first injection of Firmagon 06/13/2016-8/28/20218: Received 6 cycles of Taxotere  chemotherapy 75 mg/m2 01/15/2018-09/18/2018: Received Lupron  injections q 4 months   CURRENT THERAPY: 01/20/2019-present: Started Eligard  injections q 4 months  INTERVAL HISTORY: Jeff Jeff Decker 79 y.o. male returns to the clinic today for a follow-up visit.  The patient was last seen on 10/15/2023.  In the interim, he denies any changes to his health.  [name] reports he has been well overall in interim since our last visit.  He reports his energy levels are good and he has great strength.  He is riding his horses.  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  He reports that he is tolerating his Eligard  well with some occasional hot flashes and sweats but it is not causing him any major difficulties.  His appetite is strong and he is eating well.  He notes that he is aware his PSA is rising and wants to continue observing for now.  He reports that based on today's numbers we will determine if we can add additional medications.  We discussed the risks and benefits of Zytiga therapy as additional treatment.  He voices understanding.  He denies any nausea, vomiting, diarrhea, or shortness of  breath/chest pain.  A full 10 point ROS is otherwise negative.  MEDICAL HISTORY: Past Medical History:  Diagnosis Date   Allergy    Arthritis    Cancer (HCC) 03/2016   prostate and bone , chemo completed   Coronary artery disease    GERD (gastroesophageal reflux disease)    Hyperlipidemia    Myocardial infarction (HCC) 1989    ALLERGIES:  is allergic to statins.  MEDICATIONS:  Current Outpatient Medications  Medication Sig Dispense Refill   acetaminophen  (TYLENOL ) 325 MG tablet Take 2 tablets (650 mg total) by mouth every 6 (six) hours as needed for mild pain.     aspirin  EC 81 MG tablet Take 81 mg by mouth daily.     Calcium  Carb-Cholecalciferol 600-800 MG-UNIT TABS Take 1 tablet by mouth daily.      Evolocumab  (REPATHA  SURECLICK) 140 MG/ML SOAJ Inject 140 mg into the skin every 14 (fourteen) days. 6 mL 3   fexofenadine (ALLEGRA) 180 MG tablet Take 180 mg by mouth daily.     glucosamine-chondroitin 500-400 MG tablet Take 1 tablet by mouth daily.      metoprolol  tartrate (LOPRESSOR ) 25 MG tablet TAKE 1/2 TABLET TWICE A DAY BY MOUTH (Patient not taking: Reported on 02/04/2024) 90 tablet 3   Multiple Vitamin (MULTI-VITAMINS) TABS Take by mouth.     nitroGLYCERIN  (NITROSTAT ) 0.4 MG SL tablet PLACE 1 TABLET UNDER TONGUE EVERY 5 MINUTES AS NEEDED FOR CHEST PAIN. 75 tablet 2   omeprazole (PRILOSEC) 20 MG capsule Take 20 mg by mouth daily before breakfast.  1   No current facility-administered medications for this visit.    SURGICAL HISTORY:  Past Surgical History:  Procedure Laterality Date   COLONOSCOPY  2006   CORONARY ANGIOPLASTY WITH STENT PLACEMENT     CORONARY ARTERY BYPASS GRAFT N/A 10/03/2017   Procedure: CORONARY ARTERY BYPASS GRAFTING (CABG) x 4, USING LEFT INTERNAL MAMMARY ARTERY AND RIGHT GREATER SAPHENOUS VEIN HARVESTED ENDOSCOPICALLY. LIMA TO LAD, SVG TO DIAG, SVG TO OM, SVG TO PDA.;  Surgeon: Lucas Dorise POUR, MD;  Location: [facility] OR;  Service: Open Heart Surgery;   Laterality: N/A;   HEMORRHOID SURGERY     LEFT HEART CATH AND CORONARY ANGIOGRAPHY N/A 10/01/2017   Procedure: LEFT HEART CATH AND CORONARY ANGIOGRAPHY;  Surgeon: Jordan, Peter M, MD;  Location: [facility] INVASIVE CV LAB;  Service: Cardiovascular;  Laterality: N/A;   ORIF SHOULDER FRACTURE     79 years old   PROSTATE BIOPSY  03/2016   TEE WITHOUT CARDIOVERSION N/A 10/03/2017   Procedure: TRANSESOPHAGEAL ECHOCARDIOGRAM (TEE);  Surgeon: Lucas Dorise POUR, MD;  Location: [facility] OR;  Service: Open Heart Surgery;  Laterality: N/A;   TONSILLECTOMY AND ADENOIDECTOMY     79 years old    REVIEW OF SYSTEMS:   Review of Systems  Constitutional: Positive for night sweats. Positive for weight loss due to dietary changes. Negative for chills, fatigue, and fever.   HENT: Negative for mouth sores, nosebleeds, sore throat and trouble swallowing.   Eyes: Negative for eye problems and icterus.  Respiratory: Negative for cough, hemoptysis, shortness of breath and wheezing.   Cardiovascular: Negative for chest pain and leg swelling.  Gastrointestinal: Negative for abdominal pain, constipation, diarrhea, nausea and vomiting.  Genitourinary: Negative for bladder incontinence, difficulty urinating, dysuria, frequency and hematuria.   Musculoskeletal: Negative for back pain, gait problem, neck pain and neck stiffness.  Skin: Negative for itching and rash.  Neurological: Negative for dizziness, extremity weakness, gait problem, headaches, light-headedness and seizures.  Hematological: Negative for adenopathy. Does not bruise/bleed easily.  Psychiatric/Behavioral: Positive for sleep disturbances secondary to night sweats. Negative for confusion, and depression. . The patient is not nervous/anxious.     PHYSICAL EXAMINATION:  Blood pressure 117/66, pulse (!) 51, temperature (!) 97.3 F (36.3 C), temperature source Temporal, resp. rate 17, height 5' 11 (1.803 m), weight 169 lb 8 oz (76.9 kg), SpO2 100%.  ECOG PERFORMANCE STATUS:  0-1  Physical Exam  Constitutional: Oriented to person, place, and time and well-developed, well-nourished, and in no distress.  HENT:  Head: Normocephalic and atraumatic.  Mouth/Throat: Oropharynx is clear and moist. No oropharyngeal exudate.  Eyes: Conjunctivae are normal. Right eye exhibits no discharge. Left eye exhibits no discharge. No scleral icterus.  Cardiovascular: Bradycardic, regular rhythm, normal heart sounds and intact distal pulses.   Pulmonary/Chest: Effort normal and breath sounds normal. No respiratory distress. No wheezes. No rales.  Musculoskeletal: Normal range of motion. Exhibits no edema.  Lymphadenopathy:    No cervical adenopathy.  Neurological: Alert and oriented to person, place, and time. Exhibits normal muscle tone. Gait normal. Coordination normal.  Skin: Skin is warm and dry. No rash noted. Not diaphoretic. No erythema. No pallor.  Psychiatric: Mood, memory and judgment normal.  Vitals reviewed.  LABORATORY DATA: Lab Results  Component Value Date   WBC 4.7 02/05/2024   HGB 12.4 (L) 02/05/2024   HCT 36.6 (L) 02/05/2024   MCV 85.5 02/05/2024   PLT 264 02/05/2024      Chemistry      Component Value Date/Time  NA 140 02/05/2024 0810   NA 140 11/09/2022 0836   NA 139 11/20/2016 0800   K 4.4 02/05/2024 0810   K 4.2 11/20/2016 0800   CL 104 02/05/2024 0810   CO2 27 02/05/2024 0810   CO2 24 11/20/2016 0800   BUN 15 02/05/2024 0810   BUN 16 11/09/2022 0836   BUN 16.7 11/20/2016 0800   CREATININE 0.84 02/05/2024 0810   CREATININE 0.8 11/20/2016 0800      Component Value Date/Time   CALCIUM  9.2 02/05/2024 0810   CALCIUM  9.1 11/20/2016 0800   ALKPHOS 53 02/05/2024 0810   ALKPHOS 58 11/20/2016 0800   AST 21 02/05/2024 0810   AST 18 11/20/2016 0800   ALT 16 02/05/2024 0810   ALT 16 11/20/2016 0800   BILITOT 0.5 02/05/2024 0810   BILITOT 0.40 11/20/2016 0800       RADIOGRAPHIC STUDIES:  No results found.   ASSESSMENT/PLAN:  Jeff Jeff Decker is a 79 y.o. male who presents to the clinic for follow up for advanced prostate cancer.    # Castration-sensitive advanced prostate cancer involving the pelvic lymph nodes and bones: --Received first firmagon injection in 04/2016 --Received 6 cycles of Taxotere  chemotherapy 75 mg/m2 from 06/13/2016-8/28/20218 --Received Lupron  injections q 4 months from 01/15/2018-09/18/2018 --Started Eligard  injections q 4 months on 01/19/2021 PLAN: --Labs from today show white blood cell 4.7, hemoglobin 12.4, MCV 85.5, platelets 264 creatinine and LFTs are normal.  -- last PSA at 0.6 on 10/16/2023. Repeating PSA level today --Proceed with Eligard  injection today --discussed the addition of zytiga with patient by phone on 10/16/2023. He wanted to continue to monitor for now. If PSA rising would start zytiga.  --RTC in 4 months with labs, follow up with repeat Eligard  injection    No orders of the defined types were placed in this encounter.   I have spent a total of 30 minutes minutes of face-to-face and non-face-to-face time, preparing to see the patient,  performing a medically appropriate examination, counseling and educating the patient, documenting clinical information in the electronic health record, independently interpreting results and communicating results to the patient, and care coordination.   Norleen IVAR Kidney, MD Department of Hematology/Oncology [facility] at [facility] Phone: [phone] Pager: [phone] Email: [email or web address]

## 2024-02-04 ENCOUNTER — Ambulatory Visit: Admitting: Cardiology

## 2024-02-04 VITALS — BP 130/60 | HR 58 | Ht 71.0 in | Wt 171.0 lb

## 2024-02-04 DIAGNOSIS — Z79899 Other long term (current) drug therapy: Secondary | ICD-10-CM

## 2024-02-04 DIAGNOSIS — E785 Hyperlipidemia, unspecified: Secondary | ICD-10-CM

## 2024-02-04 DIAGNOSIS — I2510 Atherosclerotic heart disease of native coronary artery without angina pectoris: Secondary | ICD-10-CM | POA: Diagnosis not present

## 2024-02-04 NOTE — Progress Notes (Unsigned)
" °  Cardiology Office Note:  .   Date:  02/04/2024  ID:  Jeff Decker, DOB 1945-08-05, MRN [id number] [facility]CP: [name] Providers Cardiologist:  Oneil Parchment, MD { Click to update primary MD,subspecialty MD or APP then REFRESH:1}   History of Present Illness: .   Jeff Decker is a 79 y.o. male Discussed the use of AI scribe software for clinical note transcription with the patient, who gave verbal consent to proceed.  History of Present Illness       ROS: ***  Studies Reviewed: SABRA   EKG Interpretation Date/Time:  Tuesday February 04 2024 15:28:24 EST Ventricular Rate:  58 PR Interval:  186 QRS Duration:  84 QT Interval:  428 QTC Calculation: 420 R Axis:   76  Text Interpretation: Sinus bradycardia Poor R wave progression When compared with ECG of 09-Nov-2022 08:01, PR interval has decreased Questionable change in initial forces of Anterior leads Confirmed by Parchment Oneil ([id number]) on 02/04/2024 3:37:51 PM    Results  Risk Assessment/Calculations:   {Does this patient have ATRIAL FIBRILLATION?:[phone]}         Physical Exam:   VS:  BP 130/60 (BP Location: Left Arm, Patient Position: Sitting, Cuff Size: Normal)   Pulse (!) 58   Ht 5' 11 (1.803 m)   Wt 171 lb (77.6 kg)   SpO2 97%   BMI 23.85 kg/m    Wt Readings from Last 3 Encounters:  02/04/24 171 lb (77.6 kg)  10/15/23 171 lb (77.6 kg)  06/11/23 170 lb 11.2 oz (77.4 kg)    GEN: Well nourished, well developed in no acute distress NECK: No JVD; No carotid bruits CARDIAC: ***RRR, no murmurs, no rubs, no gallops RESPIRATORY:  Clear to auscultation without rales, wheezing or rhonchi  ABDOMEN: Soft, non-tender, non-distended EXTREMITIES:  No edema; No deformity   ASSESSMENT AND PLAN: .    Assessment and Plan Assessment & Plan        {Are you ordering a CV Procedure (e.g. stress test, cath, DCCV, TEE, etc)?   Press F2        :[id number]}  Dispo:  ***  Signed, Oneil Parchment, MD  "

## 2024-02-04 NOTE — Patient Instructions (Signed)
 Medication Instructions:  The current medical regimen is effective;  continue present plan and medications.  *If you need a refill on your cardiac medications before your next appointment, please call your pharmacy*  Lab: Please have Lipid panel drawn on the 1st floor - [facility].  Follow-Up: At [facility], you and your health needs are our priority.  As part of our continuing mission to provide you with exceptional heart care, our providers are all part of one team.  This team includes your primary Cardiologist (physician) and Advanced Practice Providers or APPs (Physician Assistants and Nurse Practitioners) who all work together to provide you with the care you need, when you need it.  Your next appointment:   1 year(s)  Provider:   Oneil Parchment, MD    We recommend signing up for the patient portal called MyChart.  Sign up information is provided on this After Visit Summary.  MyChart is used to connect with patients for Virtual Visits (Telemedicine).  Patients are able to view lab/test results, encounter notes, upcoming appointments, etc.  Non-urgent messages can be sent to your provider as well.   To learn more about what you can do with MyChart, go to [email or web address].

## 2024-02-05 ENCOUNTER — Inpatient Hospital Stay

## 2024-02-05 ENCOUNTER — Inpatient Hospital Stay (HOSPITAL_BASED_OUTPATIENT_CLINIC_OR_DEPARTMENT_OTHER): Admitting: Hematology and Oncology

## 2024-02-05 ENCOUNTER — Ambulatory Visit: Payer: Self-pay | Admitting: Cardiology

## 2024-02-05 ENCOUNTER — Inpatient Hospital Stay: Attending: Hematology and Oncology

## 2024-02-05 VITALS — BP 117/66 | HR 51 | Temp 97.3°F | Resp 17 | Ht 71.0 in | Wt 169.5 lb

## 2024-02-05 DIAGNOSIS — C61 Malignant neoplasm of prostate: Secondary | ICD-10-CM

## 2024-02-05 DIAGNOSIS — Z79899 Other long term (current) drug therapy: Secondary | ICD-10-CM | POA: Insufficient documentation

## 2024-02-05 DIAGNOSIS — C7951 Secondary malignant neoplasm of bone: Secondary | ICD-10-CM | POA: Insufficient documentation

## 2024-02-05 LAB — CBC WITH DIFFERENTIAL (CANCER CENTER ONLY)
Abs Immature Granulocytes: 0.01 K/uL (ref 0.00–0.07)
Basophils Absolute: 0.1 K/uL (ref 0.0–0.1)
Basophils Relative: 1 %
Eosinophils Absolute: 0.1 K/uL (ref 0.0–0.5)
Eosinophils Relative: 1 %
HCT: 36.6 % — ABNORMAL LOW (ref 39.0–52.0)
Hemoglobin: 12.4 g/dL — ABNORMAL LOW (ref 13.0–17.0)
Immature Granulocytes: 0 %
Lymphocytes Relative: 39 %
Lymphs Abs: 1.8 K/uL (ref 0.7–4.0)
MCH: 29 pg (ref 26.0–34.0)
MCHC: 33.9 g/dL (ref 30.0–36.0)
MCV: 85.5 fL (ref 80.0–100.0)
Monocytes Absolute: 0.6 K/uL (ref 0.1–1.0)
Monocytes Relative: 13 %
Neutro Abs: 2.2 K/uL (ref 1.7–7.7)
Neutrophils Relative %: 46 %
Platelet Count: 264 K/uL (ref 150–400)
RBC: 4.28 MIL/uL (ref 4.22–5.81)
RDW: 12.6 % (ref 11.5–15.5)
WBC Count: 4.7 K/uL (ref 4.0–10.5)
nRBC: 0 % (ref 0.0–0.2)

## 2024-02-05 LAB — CMP (CANCER CENTER ONLY)
ALT: 16 U/L (ref 0–44)
AST: 21 U/L (ref 15–41)
Albumin: 4.5 g/dL (ref 3.5–5.0)
Alkaline Phosphatase: 53 U/L (ref 38–126)
Anion gap: 9 (ref 5–15)
BUN: 15 mg/dL (ref 8–23)
CO2: 27 mmol/L (ref 22–32)
Calcium: 9.2 mg/dL (ref 8.9–10.3)
Chloride: 104 mmol/L (ref 98–111)
Creatinine: 0.84 mg/dL (ref 0.61–1.24)
GFR, Estimated: >60 mL/min
Glucose, Bld: 114 mg/dL — ABNORMAL HIGH (ref 70–99)
Potassium: 4.4 mmol/L (ref 3.5–5.1)
Sodium: 140 mmol/L (ref 135–145)
Total Bilirubin: 0.5 mg/dL (ref 0.0–1.2)
Total Protein: 7.1 g/dL (ref 6.5–8.1)

## 2024-02-05 LAB — PSA: Prostatic Specific Antigen: 0.84 ng/mL (ref 0.00–4.00)

## 2024-02-05 LAB — LIPID PANEL
Chol/HDL Ratio: 2.4 ratio (ref 0.0–5.0)
Cholesterol, Total: 141 mg/dL (ref 100–199)
HDL: 58 mg/dL
LDL Chol Calc (NIH): 68 mg/dL (ref 0–99)
Triglycerides: 75 mg/dL (ref 0–149)
VLDL Cholesterol Cal: 15 mg/dL (ref 5–40)

## 2024-02-05 MED ORDER — LEUPROLIDE ACETATE (4 MONTH) 30 MG ~~LOC~~ KIT
30.0000 mg | PACK | Freq: Once | SUBCUTANEOUS | Status: AC
  Administered 2024-02-05: 30 mg via SUBCUTANEOUS
  Filled 2024-02-05: qty 30

## 2024-02-06 LAB — TESTOSTERONE: Testosterone: <3 ng/dL — ABNORMAL LOW (ref 264–916)

## 2024-06-09 ENCOUNTER — Inpatient Hospital Stay: Admitting: Physician Assistant

## 2024-06-09 ENCOUNTER — Inpatient Hospital Stay
